# Patient Record
Sex: Female | Born: 1940
Health system: Southern US, Community
[De-identification: ages and names within clinical notes are randomized; demographics above are authoritative.]

## PROBLEM LIST (undated history)

## (undated) DIAGNOSIS — K219 Gastro-esophageal reflux disease without esophagitis: Secondary | ICD-10-CM

## (undated) DIAGNOSIS — G709 Myoneural disorder, unspecified: Secondary | ICD-10-CM

## (undated) DIAGNOSIS — K589 Irritable bowel syndrome without diarrhea: Secondary | ICD-10-CM

## (undated) DIAGNOSIS — E785 Hyperlipidemia, unspecified: Secondary | ICD-10-CM

## (undated) DIAGNOSIS — Z5189 Encounter for other specified aftercare: Secondary | ICD-10-CM

## (undated) DIAGNOSIS — R011 Cardiac murmur, unspecified: Secondary | ICD-10-CM

## (undated) DIAGNOSIS — K602 Anal fissure, unspecified: Secondary | ICD-10-CM

## (undated) DIAGNOSIS — M8580 Other specified disorders of bone density and structure, unspecified site: Secondary | ICD-10-CM

## (undated) DIAGNOSIS — H10029 Other mucopurulent conjunctivitis, unspecified eye: Secondary | ICD-10-CM

## (undated) DIAGNOSIS — K52832 Lymphocytic colitis: Secondary | ICD-10-CM

## (undated) DIAGNOSIS — K760 Fatty (change of) liver, not elsewhere classified: Secondary | ICD-10-CM

## (undated) DIAGNOSIS — M4800 Spinal stenosis, site unspecified: Secondary | ICD-10-CM

## (undated) DIAGNOSIS — M1990 Unspecified osteoarthritis, unspecified site: Secondary | ICD-10-CM

## (undated) DIAGNOSIS — K5730 Diverticulosis of large intestine without perforation or abscess without bleeding: Secondary | ICD-10-CM

## (undated) DIAGNOSIS — I493 Ventricular premature depolarization: Secondary | ICD-10-CM

## (undated) DIAGNOSIS — I7300 Raynaud's syndrome without gangrene: Secondary | ICD-10-CM

## (undated) DIAGNOSIS — L719 Rosacea, unspecified: Secondary | ICD-10-CM

## (undated) HISTORY — DX: Anal fissure, unspecified: K60.2

## (undated) HISTORY — DX: Myoneural disorder, unspecified: G70.9

## (undated) HISTORY — DX: Irritable bowel syndrome, unspecified: K58.9

## (undated) HISTORY — DX: Fatty (change of) liver, not elsewhere classified: K76.0

## (undated) HISTORY — DX: Lymphocytic colitis: K52.832

## (undated) HISTORY — PX: OTHER SURGICAL HISTORY: SHX169

## (undated) HISTORY — DX: Hyperlipidemia, unspecified: E78.5

## (undated) HISTORY — DX: Cardiac murmur, unspecified: R01.1

## (undated) HISTORY — DX: Unspecified osteoarthritis, unspecified site: M19.90

## (undated) HISTORY — DX: Spinal stenosis, site unspecified: M48.00

## (undated) HISTORY — PX: CHOLECYSTECTOMY: SHX55

## (undated) HISTORY — DX: Rosacea, unspecified: L71.9

## (undated) HISTORY — DX: Encounter for other specified aftercare: Z51.89

## (undated) HISTORY — PX: COLONOSCOPY: SHX174

## (undated) HISTORY — DX: Other specified disorders of bone density and structure, unspecified site: M85.80

## (undated) HISTORY — PX: REPLACEMENT TOTAL KNEE: SUR1224

## (undated) HISTORY — DX: Other mucopurulent conjunctivitis, unspecified eye: H10.029

## (undated) HISTORY — DX: Raynaud's syndrome without gangrene: I73.00

## (undated) HISTORY — DX: Gastro-esophageal reflux disease without esophagitis: K21.9

## (undated) HISTORY — DX: Ventricular premature depolarization: I49.3

## (undated) HISTORY — PX: FOOT SURGERY: SHX648

## (undated) HISTORY — PX: KNEE ARTHROSCOPY: SUR90

## (undated) HISTORY — PX: CATARACT EXTRACTION: SUR2

## (undated) HISTORY — DX: Diverticulosis of large intestine without perforation or abscess without bleeding: K57.30

---

## 1962-04-02 HISTORY — PX: OTHER SURGICAL HISTORY: SHX169

## 1971-04-03 HISTORY — PX: HIP SURGERY: SHX245

## 1988-04-02 HISTORY — PX: TOTAL ABDOMINAL HYSTERECTOMY W/ BILATERAL SALPINGOOPHORECTOMY: SHX83

## 1988-04-02 HISTORY — PX: DILATION AND CURETTAGE OF UTERUS: SHX78

## 1996-03-31 ENCOUNTER — Encounter: Payer: Self-pay | Admitting: Internal Medicine

## 1997-10-19 ENCOUNTER — Ambulatory Visit (HOSPITAL_COMMUNITY): Admission: RE | Admit: 1997-10-19 | Discharge: 1997-10-19 | Payer: Self-pay | Admitting: Orthopedic Surgery

## 1998-02-28 ENCOUNTER — Other Ambulatory Visit: Admission: RE | Admit: 1998-02-28 | Discharge: 1998-02-28 | Payer: Self-pay | Admitting: *Deleted

## 1999-02-28 ENCOUNTER — Encounter: Admission: RE | Admit: 1999-02-28 | Discharge: 1999-02-28 | Payer: Self-pay | Admitting: *Deleted

## 1999-02-28 ENCOUNTER — Encounter: Payer: Self-pay | Admitting: *Deleted

## 1999-03-01 ENCOUNTER — Other Ambulatory Visit: Admission: RE | Admit: 1999-03-01 | Discharge: 1999-03-01 | Payer: Self-pay | Admitting: *Deleted

## 1999-08-30 ENCOUNTER — Other Ambulatory Visit: Admission: RE | Admit: 1999-08-30 | Discharge: 1999-08-30 | Payer: Self-pay | Admitting: *Deleted

## 2000-02-01 ENCOUNTER — Other Ambulatory Visit: Admission: RE | Admit: 2000-02-01 | Discharge: 2000-02-01 | Payer: Self-pay | Admitting: *Deleted

## 2000-02-29 ENCOUNTER — Encounter: Payer: Self-pay | Admitting: *Deleted

## 2000-02-29 ENCOUNTER — Encounter: Admission: RE | Admit: 2000-02-29 | Discharge: 2000-02-29 | Payer: Self-pay | Admitting: *Deleted

## 2000-08-07 ENCOUNTER — Other Ambulatory Visit: Admission: RE | Admit: 2000-08-07 | Discharge: 2000-08-07 | Payer: Self-pay | Admitting: *Deleted

## 2001-01-31 ENCOUNTER — Other Ambulatory Visit: Admission: RE | Admit: 2001-01-31 | Discharge: 2001-01-31 | Payer: Self-pay | Admitting: *Deleted

## 2001-03-03 ENCOUNTER — Encounter: Admission: RE | Admit: 2001-03-03 | Discharge: 2001-03-03 | Payer: Self-pay | Admitting: *Deleted

## 2001-03-03 ENCOUNTER — Encounter: Payer: Self-pay | Admitting: *Deleted

## 2002-03-04 ENCOUNTER — Encounter: Payer: Self-pay | Admitting: *Deleted

## 2002-03-04 ENCOUNTER — Encounter: Admission: RE | Admit: 2002-03-04 | Discharge: 2002-03-04 | Payer: Self-pay | Admitting: *Deleted

## 2002-03-12 ENCOUNTER — Other Ambulatory Visit: Admission: RE | Admit: 2002-03-12 | Discharge: 2002-03-12 | Payer: Self-pay | Admitting: *Deleted

## 2002-08-28 ENCOUNTER — Ambulatory Visit: Admission: RE | Admit: 2002-08-28 | Discharge: 2002-08-28 | Payer: Self-pay | Admitting: Orthopedic Surgery

## 2002-10-14 ENCOUNTER — Emergency Department (HOSPITAL_COMMUNITY): Admission: EM | Admit: 2002-10-14 | Discharge: 2002-10-14 | Payer: Self-pay | Admitting: Emergency Medicine

## 2002-10-14 ENCOUNTER — Encounter: Payer: Self-pay | Admitting: Emergency Medicine

## 2002-10-19 ENCOUNTER — Encounter: Payer: Self-pay | Admitting: Internal Medicine

## 2003-03-04 ENCOUNTER — Other Ambulatory Visit: Admission: RE | Admit: 2003-03-04 | Discharge: 2003-03-04 | Payer: Self-pay | Admitting: *Deleted

## 2003-03-10 ENCOUNTER — Encounter: Admission: RE | Admit: 2003-03-10 | Discharge: 2003-03-10 | Payer: Self-pay | Admitting: *Deleted

## 2003-07-19 ENCOUNTER — Encounter: Payer: Self-pay | Admitting: Internal Medicine

## 2003-08-01 HISTORY — PX: TOTAL KNEE ARTHROPLASTY: SHX125

## 2003-08-25 ENCOUNTER — Inpatient Hospital Stay (HOSPITAL_COMMUNITY): Admission: RE | Admit: 2003-08-25 | Discharge: 2003-08-29 | Payer: Self-pay | Admitting: Orthopedic Surgery

## 2003-08-26 ENCOUNTER — Encounter: Payer: Self-pay | Admitting: Internal Medicine

## 2004-02-10 ENCOUNTER — Other Ambulatory Visit: Admission: RE | Admit: 2004-02-10 | Discharge: 2004-02-10 | Payer: Self-pay | Admitting: *Deleted

## 2004-02-22 ENCOUNTER — Ambulatory Visit: Payer: Self-pay | Admitting: Internal Medicine

## 2004-03-02 ENCOUNTER — Ambulatory Visit: Payer: Self-pay | Admitting: Internal Medicine

## 2004-03-17 ENCOUNTER — Encounter: Admission: RE | Admit: 2004-03-17 | Discharge: 2004-03-17 | Payer: Self-pay | Admitting: *Deleted

## 2004-04-02 LAB — HM COLONOSCOPY: HM Colonoscopy: NORMAL

## 2004-06-26 ENCOUNTER — Ambulatory Visit: Payer: Self-pay | Admitting: Internal Medicine

## 2004-08-08 ENCOUNTER — Encounter: Admission: RE | Admit: 2004-08-08 | Discharge: 2004-08-08 | Payer: Self-pay | Admitting: Internal Medicine

## 2004-08-08 ENCOUNTER — Ambulatory Visit: Payer: Self-pay | Admitting: Internal Medicine

## 2004-08-14 ENCOUNTER — Ambulatory Visit: Payer: Self-pay | Admitting: Internal Medicine

## 2004-08-16 ENCOUNTER — Encounter: Payer: Self-pay | Admitting: Internal Medicine

## 2004-08-16 ENCOUNTER — Ambulatory Visit: Payer: Self-pay | Admitting: Cardiology

## 2004-10-26 ENCOUNTER — Ambulatory Visit: Payer: Self-pay | Admitting: Internal Medicine

## 2004-11-09 ENCOUNTER — Ambulatory Visit: Payer: Self-pay | Admitting: Internal Medicine

## 2004-11-10 ENCOUNTER — Encounter: Payer: Self-pay | Admitting: Internal Medicine

## 2004-11-10 ENCOUNTER — Ambulatory Visit: Payer: Self-pay | Admitting: Internal Medicine

## 2004-11-10 LAB — HM COLONOSCOPY

## 2004-11-11 ENCOUNTER — Encounter: Payer: Self-pay | Admitting: Internal Medicine

## 2004-11-13 ENCOUNTER — Ambulatory Visit: Payer: Self-pay | Admitting: Internal Medicine

## 2004-12-12 ENCOUNTER — Ambulatory Visit: Payer: Self-pay | Admitting: Internal Medicine

## 2005-02-07 ENCOUNTER — Ambulatory Visit: Payer: Self-pay | Admitting: Internal Medicine

## 2005-02-12 ENCOUNTER — Other Ambulatory Visit: Admission: RE | Admit: 2005-02-12 | Discharge: 2005-02-12 | Payer: Self-pay | Admitting: Obstetrics and Gynecology

## 2005-02-19 ENCOUNTER — Encounter: Payer: Self-pay | Admitting: Internal Medicine

## 2005-02-19 ENCOUNTER — Ambulatory Visit: Payer: Self-pay

## 2005-03-14 ENCOUNTER — Ambulatory Visit: Payer: Self-pay | Admitting: Internal Medicine

## 2005-03-21 ENCOUNTER — Ambulatory Visit: Payer: Self-pay | Admitting: Internal Medicine

## 2005-04-02 HISTORY — PX: OTHER SURGICAL HISTORY: SHX169

## 2005-05-08 ENCOUNTER — Encounter: Admission: RE | Admit: 2005-05-08 | Discharge: 2005-05-08 | Payer: Self-pay | Admitting: Internal Medicine

## 2005-06-18 ENCOUNTER — Ambulatory Visit: Payer: Self-pay | Admitting: Internal Medicine

## 2005-08-31 HISTORY — PX: TOTAL KNEE ARTHROPLASTY: SHX125

## 2005-09-17 ENCOUNTER — Inpatient Hospital Stay (HOSPITAL_COMMUNITY): Admission: RE | Admit: 2005-09-17 | Discharge: 2005-09-20 | Payer: Self-pay | Admitting: Orthopedic Surgery

## 2005-10-12 ENCOUNTER — Ambulatory Visit: Payer: Self-pay | Admitting: Internal Medicine

## 2006-01-16 ENCOUNTER — Ambulatory Visit: Payer: Self-pay | Admitting: Internal Medicine

## 2006-01-19 ENCOUNTER — Ambulatory Visit: Payer: Self-pay | Admitting: Family Medicine

## 2006-01-22 ENCOUNTER — Ambulatory Visit: Payer: Self-pay | Admitting: Internal Medicine

## 2006-01-22 LAB — CONVERTED CEMR LAB
Basophils Absolute: 0 10*3/uL (ref 0.0–0.1)
Basophils Relative: 0.3 % (ref 0.0–1.0)
Eosinophil percent: 0.8 % (ref 0.0–5.0)
HCT: 44 % (ref 36.0–46.0)
Hemoglobin: 14.2 g/dL (ref 12.0–15.0)
Lymphocytes Relative: 31.4 % (ref 12.0–46.0)
MCHC: 32.2 g/dL (ref 30.0–36.0)
MCV: 84.5 fL (ref 78.0–100.0)
Monocytes Absolute: 0.5 10*3/uL (ref 0.2–0.7)
Monocytes Relative: 8.4 % (ref 3.0–11.0)
Neutro Abs: 3.8 10*3/uL (ref 1.4–7.7)
Neutrophils Relative %: 59.1 % (ref 43.0–77.0)
Platelets: 283 10*3/uL (ref 150–400)
RBC: 5.21 M/uL — ABNORMAL HIGH (ref 3.87–5.11)
RDW: 15.1 % — ABNORMAL HIGH (ref 11.5–14.6)
Sed Rate: 10 mm/hr (ref 0–25)
WBC: 6.2 10*3/uL (ref 4.5–10.5)

## 2006-02-06 ENCOUNTER — Ambulatory Visit: Payer: Self-pay | Admitting: Internal Medicine

## 2006-02-06 LAB — CONVERTED CEMR LAB
ALT: 17 units/L (ref 0–40)
AST: 19 units/L (ref 0–37)
Albumin: 3.7 g/dL (ref 3.5–5.2)
Alkaline Phosphatase: 72 units/L (ref 39–117)
BUN: 8 mg/dL (ref 6–23)
Basophils Absolute: 0 10*3/uL (ref 0.0–0.1)
Basophils Relative: 0.2 % (ref 0.0–1.0)
CO2: 32 meq/L (ref 19–32)
Calcium: 9.1 mg/dL (ref 8.4–10.5)
Chloride: 102 meq/L (ref 96–112)
Chol/HDL Ratio, serum: 4.7
Cholesterol: 168 mg/dL (ref 0–200)
Creatinine, Ser: 0.6 mg/dL (ref 0.4–1.2)
Eosinophil percent: 0.5 % (ref 0.0–5.0)
GFR calc non Af Amer: 107 mL/min
Glomerular Filtration Rate, Af Am: 129 mL/min/{1.73_m2}
Glucose, Bld: 91 mg/dL (ref 70–99)
HCT: 43.1 % (ref 36.0–46.0)
HDL: 35.7 mg/dL — ABNORMAL LOW (ref >39.0)
Hemoglobin: 14.5 g/dL (ref 12.0–15.0)
LDL Cholesterol: 115 mg/dL — ABNORMAL HIGH (ref 0–99)
Lymphocytes Relative: 32.1 % (ref 12.0–46.0)
MCHC: 33.5 g/dL (ref 30.0–36.0)
MCV: 83.6 fL (ref 78.0–100.0)
Monocytes Absolute: 0.5 10*3/uL (ref 0.2–0.7)
Monocytes Relative: 9.3 % (ref 3.0–11.0)
Neutro Abs: 2.9 10*3/uL (ref 1.4–7.7)
Neutrophils Relative %: 57.9 % (ref 43.0–77.0)
Platelets: 257 10*3/uL (ref 150–400)
Potassium: 3.4 meq/L — ABNORMAL LOW (ref 3.5–5.1)
RBC: 5.16 M/uL — ABNORMAL HIGH (ref 3.87–5.11)
RDW: 14.8 % — ABNORMAL HIGH (ref 11.5–14.6)
Sodium: 140 meq/L (ref 135–145)
TSH: 0.77 microintl units/mL (ref 0.35–5.50)
Total Bilirubin: 0.8 mg/dL (ref 0.3–1.2)
Total Protein: 7.1 g/dL (ref 6.0–8.3)
Triglyceride fasting, serum: 88 mg/dL (ref 0–149)
VLDL: 18 mg/dL (ref 0–40)
WBC: 5 10*3/uL (ref 4.5–10.5)

## 2006-02-12 ENCOUNTER — Ambulatory Visit: Payer: Self-pay | Admitting: Internal Medicine

## 2006-02-25 ENCOUNTER — Encounter: Admission: RE | Admit: 2006-02-25 | Discharge: 2006-02-25 | Payer: Self-pay | Admitting: Orthopedic Surgery

## 2006-07-12 ENCOUNTER — Encounter: Payer: Self-pay | Admitting: Internal Medicine

## 2006-07-12 ENCOUNTER — Encounter: Admission: RE | Admit: 2006-07-12 | Discharge: 2006-07-12 | Payer: Self-pay | Admitting: Internal Medicine

## 2006-08-12 ENCOUNTER — Ambulatory Visit: Payer: Self-pay | Admitting: Internal Medicine

## 2006-08-13 LAB — CONVERTED CEMR LAB
ALT: 22 units/L (ref 0–40)
AST: 27 units/L (ref 0–37)
Albumin: 4.4 g/dL (ref 3.5–5.2)
Alkaline Phosphatase: 86 units/L (ref 39–117)
BUN: 14 mg/dL (ref 6–23)
Basophils Absolute: 0.1 10*3/uL (ref 0.0–0.1)
Basophils Relative: 1.2 % — ABNORMAL HIGH (ref 0.0–1.0)
Bilirubin, Direct: 0.1 mg/dL (ref 0.0–0.3)
CO2: 30 meq/L (ref 19–32)
Calcium: 9.4 mg/dL (ref 8.4–10.5)
Chloride: 104 meq/L (ref 96–112)
Cholesterol: 209 mg/dL (ref 0–200)
Creatinine, Ser: 0.6 mg/dL (ref 0.4–1.2)
Direct LDL: 141.2 mg/dL
Eosinophils Absolute: 0.1 10*3/uL (ref 0.0–0.6)
Eosinophils Relative: 0.8 % (ref 0.0–5.0)
GFR calc Af Amer: 129 mL/min
GFR calc non Af Amer: 107 mL/min
Glucose, Bld: 78 mg/dL (ref 70–99)
HCT: 45.9 % (ref 36.0–46.0)
HDL: 43.9 mg/dL (ref >39.0)
Hemoglobin: 15.3 g/dL — ABNORMAL HIGH (ref 12.0–15.0)
Lymphocytes Relative: 22.3 % (ref 12.0–46.0)
MCHC: 33.4 g/dL (ref 30.0–36.0)
MCV: 87.5 fL (ref 78.0–100.0)
Monocytes Absolute: 0.4 10*3/uL (ref 0.2–0.7)
Monocytes Relative: 4.7 % (ref 3.0–11.0)
Neutro Abs: 6.1 10*3/uL (ref 1.4–7.7)
Neutrophils Relative %: 71 % (ref 43.0–77.0)
Platelets: 263 10*3/uL (ref 150–400)
Potassium: 3.8 meq/L (ref 3.5–5.1)
RBC: 5.24 M/uL — ABNORMAL HIGH (ref 3.87–5.11)
RDW: 13.1 % (ref 11.5–14.6)
Sodium: 141 meq/L (ref 135–145)
Total Bilirubin: 0.8 mg/dL (ref 0.3–1.2)
Total CHOL/HDL Ratio: 4.8
Total Protein: 7.1 g/dL (ref 6.0–8.3)
Triglycerides: 183 mg/dL — ABNORMAL HIGH (ref 0–149)
VLDL: 37 mg/dL (ref 0–40)
WBC: 8.6 10*3/uL (ref 4.5–10.5)

## 2006-08-30 ENCOUNTER — Other Ambulatory Visit: Admission: RE | Admit: 2006-08-30 | Discharge: 2006-08-30 | Payer: Self-pay | Admitting: Obstetrics & Gynecology

## 2006-09-09 ENCOUNTER — Ambulatory Visit: Payer: Self-pay | Admitting: Internal Medicine

## 2006-09-12 DIAGNOSIS — M1990 Unspecified osteoarthritis, unspecified site: Secondary | ICD-10-CM | POA: Insufficient documentation

## 2006-09-12 DIAGNOSIS — E785 Hyperlipidemia, unspecified: Secondary | ICD-10-CM | POA: Insufficient documentation

## 2007-01-10 ENCOUNTER — Ambulatory Visit: Payer: Self-pay | Admitting: Internal Medicine

## 2007-01-13 ENCOUNTER — Telehealth (INDEPENDENT_AMBULATORY_CARE_PROVIDER_SITE_OTHER): Payer: Self-pay | Admitting: *Deleted

## 2007-02-21 ENCOUNTER — Telehealth (INDEPENDENT_AMBULATORY_CARE_PROVIDER_SITE_OTHER): Payer: Self-pay | Admitting: *Deleted

## 2007-03-12 ENCOUNTER — Ambulatory Visit: Payer: Self-pay | Admitting: Internal Medicine

## 2007-03-12 DIAGNOSIS — I7300 Raynaud's syndrome without gangrene: Secondary | ICD-10-CM | POA: Insufficient documentation

## 2007-05-13 ENCOUNTER — Telehealth: Payer: Self-pay | Admitting: Internal Medicine

## 2007-05-14 ENCOUNTER — Ambulatory Visit: Payer: Self-pay | Admitting: Internal Medicine

## 2007-06-19 ENCOUNTER — Ambulatory Visit: Payer: Self-pay | Admitting: Internal Medicine

## 2007-07-31 ENCOUNTER — Encounter: Admission: RE | Admit: 2007-07-31 | Discharge: 2007-07-31 | Payer: Self-pay | Admitting: Obstetrics & Gynecology

## 2007-09-01 DIAGNOSIS — M8580 Other specified disorders of bone density and structure, unspecified site: Secondary | ICD-10-CM

## 2007-09-01 HISTORY — DX: Other specified disorders of bone density and structure, unspecified site: M85.80

## 2007-09-01 LAB — CONVERTED CEMR LAB: Pap Smear: NORMAL

## 2007-09-05 ENCOUNTER — Encounter: Payer: Self-pay | Admitting: Internal Medicine

## 2007-09-09 ENCOUNTER — Ambulatory Visit: Payer: Self-pay | Admitting: Internal Medicine

## 2007-09-10 ENCOUNTER — Encounter: Admission: RE | Admit: 2007-09-10 | Discharge: 2007-09-10 | Payer: Self-pay | Admitting: Obstetrics & Gynecology

## 2007-09-10 ENCOUNTER — Encounter: Payer: Self-pay | Admitting: Internal Medicine

## 2007-10-06 ENCOUNTER — Telehealth: Payer: Self-pay | Admitting: Internal Medicine

## 2007-10-18 ENCOUNTER — Telehealth: Payer: Self-pay | Admitting: Gastroenterology

## 2007-10-18 ENCOUNTER — Telehealth: Payer: Self-pay | Admitting: Family Medicine

## 2007-12-10 ENCOUNTER — Ambulatory Visit: Payer: Self-pay | Admitting: Internal Medicine

## 2007-12-10 LAB — CONVERTED CEMR LAB
Bilirubin Urine: NEGATIVE
Glucose, Urine, Semiquant: NEGATIVE
Ketones, urine, test strip: NEGATIVE
Specific Gravity, Urine: 1.02
pH: 6

## 2007-12-11 ENCOUNTER — Encounter: Payer: Self-pay | Admitting: Internal Medicine

## 2007-12-23 ENCOUNTER — Telehealth: Payer: Self-pay | Admitting: Internal Medicine

## 2008-01-21 ENCOUNTER — Ambulatory Visit: Payer: Self-pay | Admitting: Internal Medicine

## 2008-01-23 LAB — CONVERTED CEMR LAB
BUN: 11 mg/dL (ref 6–23)
Basophils Relative: 0.2 % (ref 0.0–3.0)
Calcium: 9.4 mg/dL (ref 8.4–10.5)
Chloride: 99 meq/L (ref 96–112)
Creatinine, Ser: 0.7 mg/dL (ref 0.4–1.2)
Eosinophils Absolute: 0 10*3/uL (ref 0.0–0.7)
Eosinophils Relative: 0.7 % (ref 0.0–5.0)
GFR calc non Af Amer: 89 mL/min
HCT: 46.5 % — ABNORMAL HIGH (ref 36.0–46.0)
Hemoglobin: 16.1 g/dL — ABNORMAL HIGH (ref 12.0–15.0)
MCV: 87.2 fL (ref 78.0–100.0)
Monocytes Absolute: 0.5 10*3/uL (ref 0.1–1.0)
Neutro Abs: 4.3 10*3/uL (ref 1.4–7.7)
Neutrophils Relative %: 62.2 % (ref 43.0–77.0)
RBC: 5.33 M/uL — ABNORMAL HIGH (ref 3.87–5.11)
WBC: 6.9 10*3/uL (ref 4.5–10.5)

## 2008-02-25 ENCOUNTER — Ambulatory Visit: Payer: Self-pay | Admitting: Internal Medicine

## 2008-02-25 ENCOUNTER — Telehealth: Payer: Self-pay | Admitting: Internal Medicine

## 2008-02-25 LAB — CONVERTED CEMR LAB: Rapid Strep: NEGATIVE

## 2008-02-26 ENCOUNTER — Telehealth: Payer: Self-pay | Admitting: Internal Medicine

## 2008-02-27 ENCOUNTER — Ambulatory Visit: Payer: Self-pay | Admitting: Internal Medicine

## 2008-03-04 ENCOUNTER — Ambulatory Visit: Payer: Self-pay | Admitting: Internal Medicine

## 2008-06-21 ENCOUNTER — Telehealth: Payer: Self-pay | Admitting: Internal Medicine

## 2008-08-02 ENCOUNTER — Encounter: Admission: RE | Admit: 2008-08-02 | Discharge: 2008-08-02 | Payer: Self-pay | Admitting: Internal Medicine

## 2008-08-11 ENCOUNTER — Ambulatory Visit: Payer: Self-pay | Admitting: Internal Medicine

## 2008-08-31 ENCOUNTER — Telehealth: Payer: Self-pay | Admitting: Internal Medicine

## 2008-09-01 DIAGNOSIS — K589 Irritable bowel syndrome without diarrhea: Secondary | ICD-10-CM | POA: Insufficient documentation

## 2008-09-01 DIAGNOSIS — K5730 Diverticulosis of large intestine without perforation or abscess without bleeding: Secondary | ICD-10-CM | POA: Insufficient documentation

## 2008-09-01 DIAGNOSIS — I4949 Other premature depolarization: Secondary | ICD-10-CM | POA: Insufficient documentation

## 2008-09-02 ENCOUNTER — Ambulatory Visit: Payer: Self-pay | Admitting: Internal Medicine

## 2008-09-03 LAB — CONVERTED CEMR LAB
CO2: 33 meq/L — ABNORMAL HIGH (ref 19–32)
Creatinine, Ser: 0.6 mg/dL (ref 0.4–1.2)
Eosinophils Relative: 0.3 % (ref 0.0–5.0)
GFR calc non Af Amer: 105.92 mL/min (ref >60)
Glucose, Bld: 121 mg/dL — ABNORMAL HIGH (ref 70–99)
HCT: 45.3 % (ref 36.0–46.0)
Hemoglobin: 15.5 g/dL — ABNORMAL HIGH (ref 12.0–15.0)
Lymphs Abs: 2.1 10*3/uL (ref 0.7–4.0)
Monocytes Relative: 5.6 % (ref 3.0–12.0)
Neutro Abs: 7.4 10*3/uL (ref 1.4–7.7)
RDW: 12.9 % (ref 11.5–14.6)
Total Bilirubin: 1.1 mg/dL (ref 0.3–1.2)
WBC: 10.1 10*3/uL (ref 4.5–10.5)

## 2008-09-06 ENCOUNTER — Ambulatory Visit: Payer: Self-pay | Admitting: Internal Medicine

## 2008-09-06 ENCOUNTER — Ambulatory Visit: Payer: Self-pay | Admitting: Cardiology

## 2008-09-06 ENCOUNTER — Telehealth: Payer: Self-pay | Admitting: Internal Medicine

## 2008-09-06 LAB — CONVERTED CEMR LAB: Potassium: 4.3 meq/L (ref 3.5–5.1)

## 2008-10-18 ENCOUNTER — Telehealth (INDEPENDENT_AMBULATORY_CARE_PROVIDER_SITE_OTHER): Payer: Self-pay | Admitting: *Deleted

## 2008-10-20 ENCOUNTER — Ambulatory Visit: Payer: Self-pay | Admitting: Internal Medicine

## 2008-10-21 LAB — CONVERTED CEMR LAB
Eosinophils Relative: 1 % (ref 0.0–5.0)
Monocytes Absolute: 0.4 10*3/uL (ref 0.1–1.0)
Monocytes Relative: 6.5 % (ref 3.0–12.0)
Neutrophils Relative %: 65.1 % (ref 43.0–77.0)
Platelets: 232 10*3/uL (ref 150.0–400.0)
Vitamin B-12: 234 pg/mL (ref 211–911)
WBC: 5.8 10*3/uL (ref 4.5–10.5)

## 2008-10-22 LAB — CONVERTED CEMR LAB: Vit D, 25-Hydroxy: 18 ng/mL — ABNORMAL LOW (ref 30–89)

## 2008-11-08 ENCOUNTER — Telehealth: Payer: Self-pay | Admitting: Internal Medicine

## 2008-11-17 ENCOUNTER — Ambulatory Visit: Payer: Self-pay | Admitting: Internal Medicine

## 2008-11-22 LAB — CONVERTED CEMR LAB
CO2: 34 meq/L — ABNORMAL HIGH (ref 19–32)
Calcium: 9.2 mg/dL (ref 8.4–10.5)
Chloride: 102 meq/L (ref 96–112)
Sodium: 141 meq/L (ref 135–145)
TSH: 1.63 microintl units/mL (ref 0.35–5.50)

## 2008-11-24 ENCOUNTER — Encounter: Payer: Self-pay | Admitting: Internal Medicine

## 2009-01-03 ENCOUNTER — Ambulatory Visit: Payer: Self-pay | Admitting: Internal Medicine

## 2009-01-12 ENCOUNTER — Encounter: Payer: Self-pay | Admitting: Internal Medicine

## 2009-02-02 ENCOUNTER — Telehealth: Payer: Self-pay | Admitting: Internal Medicine

## 2009-05-16 ENCOUNTER — Telehealth: Payer: Self-pay | Admitting: Internal Medicine

## 2009-06-02 ENCOUNTER — Telehealth: Payer: Self-pay | Admitting: Internal Medicine

## 2009-06-02 ENCOUNTER — Ambulatory Visit: Payer: Self-pay | Admitting: Internal Medicine

## 2009-06-03 LAB — CONVERTED CEMR LAB
Bilirubin Urine: NEGATIVE
Glucose, Urine, Semiquant: NEGATIVE
Protein, U semiquant: NEGATIVE
Specific Gravity, Urine: 1.015
pH: 6.5

## 2009-06-16 ENCOUNTER — Ambulatory Visit: Payer: Self-pay | Admitting: Family Medicine

## 2009-06-16 LAB — CONVERTED CEMR LAB: Rapid Strep: NEGATIVE

## 2009-07-12 ENCOUNTER — Ambulatory Visit: Payer: Self-pay | Admitting: Internal Medicine

## 2009-07-12 DIAGNOSIS — R079 Chest pain, unspecified: Secondary | ICD-10-CM | POA: Insufficient documentation

## 2009-07-14 ENCOUNTER — Encounter: Payer: Self-pay | Admitting: Internal Medicine

## 2009-07-14 LAB — CONVERTED CEMR LAB
Albumin: 4 g/dL (ref 3.5–5.2)
Basophils Absolute: 0 10*3/uL (ref 0.0–0.1)
Basophils Relative: 0.4 % (ref 0.0–3.0)
CO2: 34 meq/L — ABNORMAL HIGH (ref 19–32)
Chloride: 97 meq/L (ref 96–112)
Eosinophils Absolute: 0 10*3/uL (ref 0.0–0.7)
Glucose, Bld: 116 mg/dL — ABNORMAL HIGH (ref 70–99)
HCT: 46.2 % — ABNORMAL HIGH (ref 36.0–46.0)
HDL: 48.1 mg/dL (ref >39.00)
Hemoglobin: 15.8 g/dL — ABNORMAL HIGH (ref 12.0–15.0)
Lymphs Abs: 1.8 10*3/uL (ref 0.7–4.0)
MCHC: 34.1 g/dL (ref 30.0–36.0)
MCV: 89.1 fL (ref 78.0–100.0)
Neutro Abs: 4.7 10*3/uL (ref 1.4–7.7)
Potassium: 3 meq/L — ABNORMAL LOW (ref 3.5–5.1)
RBC: 5.19 M/uL — ABNORMAL HIGH (ref 3.87–5.11)
RDW: 14.6 % (ref 11.5–14.6)
Sodium: 140 meq/L (ref 135–145)
TSH: 2.09 microintl units/mL (ref 0.35–5.50)
Total Protein: 7.5 g/dL (ref 6.0–8.3)

## 2009-07-18 ENCOUNTER — Ambulatory Visit: Payer: Self-pay | Admitting: Internal Medicine

## 2009-07-19 ENCOUNTER — Telehealth: Payer: Self-pay | Admitting: Internal Medicine

## 2009-07-19 LAB — CONVERTED CEMR LAB
BUN: 12 mg/dL (ref 6–23)
Chloride: 104 meq/L (ref 96–112)
Creatinine, Ser: 0.6 mg/dL (ref 0.4–1.2)
Ferritin: 33.5 ng/mL (ref 10.0–291.0)
GFR calc non Af Amer: 105.65 mL/min (ref >60)
Potassium: 3.7 meq/L (ref 3.5–5.1)
Vit D, 25-Hydroxy: 29 ng/mL — ABNORMAL LOW (ref 30–89)

## 2009-08-05 ENCOUNTER — Encounter: Admission: RE | Admit: 2009-08-05 | Discharge: 2009-08-05 | Payer: Self-pay | Admitting: Internal Medicine

## 2009-08-05 LAB — HM MAMMOGRAPHY

## 2009-10-05 ENCOUNTER — Encounter (INDEPENDENT_AMBULATORY_CARE_PROVIDER_SITE_OTHER): Payer: Self-pay | Admitting: *Deleted

## 2009-10-12 ENCOUNTER — Telehealth: Payer: Self-pay | Admitting: Internal Medicine

## 2009-11-23 ENCOUNTER — Telehealth: Payer: Self-pay | Admitting: Internal Medicine

## 2009-12-15 ENCOUNTER — Ambulatory Visit: Payer: Self-pay | Admitting: Internal Medicine

## 2009-12-15 DIAGNOSIS — K645 Perianal venous thrombosis: Secondary | ICD-10-CM | POA: Insufficient documentation

## 2009-12-19 ENCOUNTER — Ambulatory Visit: Payer: Self-pay | Admitting: Family Medicine

## 2009-12-19 DIAGNOSIS — R300 Dysuria: Secondary | ICD-10-CM | POA: Insufficient documentation

## 2009-12-19 LAB — CONVERTED CEMR LAB
Bilirubin Urine: NEGATIVE
Ketones, urine, test strip: NEGATIVE
Nitrite: NEGATIVE
Specific Gravity, Urine: >=1.03
Urobilinogen, UA: 0.2

## 2010-01-05 ENCOUNTER — Telehealth: Payer: Self-pay | Admitting: Internal Medicine

## 2010-01-19 ENCOUNTER — Telehealth: Payer: Self-pay | Admitting: *Deleted

## 2010-01-20 ENCOUNTER — Telehealth: Payer: Self-pay | Admitting: Internal Medicine

## 2010-01-20 ENCOUNTER — Ambulatory Visit: Payer: Self-pay | Admitting: Family Medicine

## 2010-01-20 DIAGNOSIS — N3941 Urge incontinence: Secondary | ICD-10-CM | POA: Insufficient documentation

## 2010-01-20 LAB — CONVERTED CEMR LAB
Bilirubin Urine: NEGATIVE
Protein, U semiquant: NEGATIVE
Specific Gravity, Urine: 1.015
Urobilinogen, UA: 0.2
WBC Urine, dipstick: NEGATIVE
pH: 5

## 2010-01-25 ENCOUNTER — Encounter: Payer: Self-pay | Admitting: Internal Medicine

## 2010-01-31 ENCOUNTER — Ambulatory Visit: Payer: Self-pay | Admitting: Internal Medicine

## 2010-01-31 DIAGNOSIS — J069 Acute upper respiratory infection, unspecified: Secondary | ICD-10-CM | POA: Insufficient documentation

## 2010-02-02 ENCOUNTER — Ambulatory Visit: Payer: Self-pay | Admitting: Family Medicine

## 2010-02-27 ENCOUNTER — Telehealth: Payer: Self-pay | Admitting: Cardiovascular Disease

## 2010-02-27 ENCOUNTER — Ambulatory Visit (HOSPITAL_COMMUNITY): Admission: RE | Admit: 2010-02-27 | Discharge: 2010-02-27 | Payer: Self-pay | Admitting: Cardiovascular Disease

## 2010-02-27 ENCOUNTER — Ambulatory Visit: Payer: Self-pay | Admitting: Cardiovascular Disease

## 2010-02-27 DIAGNOSIS — R072 Precordial pain: Secondary | ICD-10-CM | POA: Insufficient documentation

## 2010-02-27 LAB — CONVERTED CEMR LAB
AST: 22 units/L (ref 0–37)
Albumin: 4.3 g/dL (ref 3.5–5.2)
BUN: 13 mg/dL (ref 6–23)
Basophils Absolute: 0 10*3/uL (ref 0.0–0.1)
Basophils Relative: 0.3 % (ref 0.0–3.0)
Calcium: 9.6 mg/dL (ref 8.4–10.5)
Creatinine, Ser: 0.6 mg/dL (ref 0.4–1.2)
Eosinophils Absolute: 0.1 10*3/uL (ref 0.0–0.7)
GFR calc non Af Amer: 107.52 mL/min (ref >60)
Glucose, Bld: 104 mg/dL — ABNORMAL HIGH (ref 70–99)
HCT: 46.3 % — ABNORMAL HIGH (ref 36.0–46.0)
Hemoglobin: 15.7 g/dL — ABNORMAL HIGH (ref 12.0–15.0)
Lymphocytes Relative: 31.3 % (ref 12.0–46.0)
Lymphs Abs: 2.5 10*3/uL (ref 0.7–4.0)
MCHC: 33.9 g/dL (ref 30.0–36.0)
MCV: 88.8 fL (ref 78.0–100.0)
Monocytes Absolute: 0.5 10*3/uL (ref 0.1–1.0)
Neutro Abs: 4.9 10*3/uL (ref 1.4–7.7)
Potassium: 3.5 meq/L (ref 3.5–5.1)
RBC: 5.21 M/uL — ABNORMAL HIGH (ref 3.87–5.11)
RDW: 14.1 % (ref 11.5–14.6)
Total Bilirubin: 0.9 mg/dL (ref 0.3–1.2)
Total CK: 138 units/L (ref 7–177)

## 2010-03-07 ENCOUNTER — Telehealth (INDEPENDENT_AMBULATORY_CARE_PROVIDER_SITE_OTHER): Payer: Self-pay | Admitting: Radiology

## 2010-03-08 ENCOUNTER — Ambulatory Visit: Payer: Self-pay

## 2010-03-08 ENCOUNTER — Encounter: Payer: Self-pay | Admitting: Cardiology

## 2010-03-08 ENCOUNTER — Encounter: Payer: Self-pay | Admitting: *Deleted

## 2010-03-08 ENCOUNTER — Encounter (HOSPITAL_COMMUNITY)
Admission: RE | Admit: 2010-03-08 | Discharge: 2010-05-02 | Payer: Self-pay | Source: Home / Self Care | Attending: Cardiovascular Disease | Admitting: Cardiovascular Disease

## 2010-03-13 ENCOUNTER — Encounter: Payer: Self-pay | Admitting: Internal Medicine

## 2010-04-23 ENCOUNTER — Encounter: Payer: Self-pay | Admitting: Internal Medicine

## 2010-05-02 NOTE — Progress Notes (Signed)
  Phone Note Call from Patient Call back at Home Phone [phone]   Caller: Patient Call For: Birdie Sons MD Summary of Call: Needs a Diflucan RX sent to [facility] since taking 2 rounds of antibiotics. Initial call taken by: Lynann Beaver CMA,  January 05, 2010 9:15 AM  Follow-up for Phone Call        diflucan 150 mg by mouth once daily x one Follow-up by: Birdie Sons MD,  January 05, 2010 11:13 AM    New/Updated Medications: DIFLUCAN 150 MG TABS (FLUCONAZOLE) one by mouth now Prescriptions: DIFLUCAN 150 MG TABS (FLUCONAZOLE) one by mouth now  #1 x 9   Entered by:   Lynann Beaver CMA   Authorized by:   Birdie Sons MD   Signed by:   Lynann Beaver CMA on 01/05/2010   Method used:   Electronically to        [facility] (retail)       [address]       [place], [place]  [id number]       Ph: [phone] or [phone]       Fax: [phone]   RxID:   [id number]

## 2010-05-02 NOTE — Progress Notes (Signed)
Summary: Chest tightness   Phone Note Call from Patient   Caller: Pt [phone] Summary of Call: I spoke with the pt and she c/o chest tightness and her left arm "bothering" her since Friday.  The pt's symptoms started Friday night and she thought about going to the ER but decided to take an ASA and see if her symptoms resolved.  The pt continues to have symptoms and would like to know what Dr Excell Seltzer recommends.     Julieta Gutting, RN, BSN,  February 27, 2010 10:47 AM  I spoke with Dr Excell Seltzer and he will see the pt today for evaluation.      [Prescriptions] Initial call taken by: Julieta Gutting, RN, BSN,  February 27, 2010 11:54 AM

## 2010-05-02 NOTE — Assessment & Plan Note (Signed)
Summary: np6/chest tightness, left arm pain/lwb  Medications Added ASPIRIN 81 MG TBEC (ASPIRIN) Take one tablet by mouth daily        Visit Type:  Initial Consult Primary Provider:  Birdie Sons, MD  CC:  Chest tightness- left arm pain for about 4 days.  History of Present Illness: 70 year-old presents for initial evaluation of chest pain. She complains of 4 days of chest pain that she describes as 'uncomfortable.' Pain is in the left chest, posterior neck, and left arm. She describes the pain as constant. She considered going to the ER 3 nights ago but opted not to do that.  Overall the pain has eased since it's onset. She has had mild shortness of breath associated with the chest pain. Pain has not been different with exertion. No associated nausea, diaphoresis, palpitations, or lightheadedness. She has no history of cardiac disease.  Current Medications (verified): 1)  Spironolactone-Hctz 25-25 Mg  Tabs (Spironolactone-Hctz) .... One By Mouth Daily 2)  Premarin 0.3 Mg Tabs (Estrogens Conjugated) .... Take 1 Tablet By Mouth Once A Day 3)  Meclizine Hcl 25 Mg Tabs (Meclizine Hcl) .... One Every 12 Hours As Needed 4)  Vitamin D (Ergocalciferol) 50000 Unit Caps (Ergocalciferol) .... One Tab By Mouth Q Week For 12 Weeks-Recheck Labs in 16 Weeks. 5)  Lomotil 2.5-0.025 Mg Tabs (Diphenoxylate-Atropine) .... Take 1 Tablet By Mouth Two Times A Day As Needed--Not To Exceed 3 Day Use 6)  K-Lor 20 Meq Pack (Potassium Chloride) .... One By Mouth Daily As Needed 7)  Zantac 150 Mg Tabs (Ranitidine Hcl) .Marland Kitchen.. 1 By Mouth Once Daily As Needed  Allergies: 1)  Codeine Phosphate (Codeine Phosphate) 2)  Morphine Sulfate (Morphine Sulfate)  Past History:  Past medical, surgical, family and social histories (including risk factors) reviewed, and no changes noted (except as noted below).  Past Medical History: Reviewed history from 09/01/2008 and no changes required. 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 Hysterectomy,bx--1990 Total knee replacement X2--both knees  lens implants Cholecystectomy Dental Implants Cataract extraction-bilateral Foot Surgery  Family History: Reviewed history from 09/01/2008 and no changes required. mother: alive No FH of Colon Cancer: Family History of Ovarian Cancer: Grandmother Family History of Diabetes: Grandfather Family History of Heart Disease: Father  Social History: Reviewed history from 09/01/2008 and no changes required. Married Occupation: Retired Veterinary surgeon Alcohol Use - yes-rare Illicit Drug Use - no Tobacco - none  Review of Systems       Negative except as per HPI   Vital Signs:  Patient profile:   70 year old female Height:      65.5 inches Weight:      173.50 pounds BMI:     28.54 Pulse rate:   66 / minute Pulse rhythm:   regular Resp:     18 per minute BP sitting:   128 / 80  (left arm) Cuff size:   large  Vitals Entered By: Vikki Ports (February 27, 2010 12:46 PM)  Physical Exam  General:  Pt is well-developed, very pleasant woman, alert and oriented, no acute distress HEENT: normal Neck: no thyromegaly           JVP normal, carotid upstrokes normal without bruits Lungs: CTA  Chest: equal expansion  CV: Apical impulse nondisplaced, RRR without murmur or gallop. Loud S2. Abd: soft, NT, positive BS, no HSM, no bruit Back: no CVA tenderness Ext: no clubbing, cyanosis, or edema        femoral pulses 2+ without bruits        pedal pulses 2+ and equal Skin: warm,  dry, no rash Neuro: CNII-XII intact,strength 5/5 = b/l    EKG  Procedure date:  02/27/2010  Findings:      NSR 66 bpm, Lefward axis, nonspecific T wave abnormality, prolonged QT (QTc 471 ms).  Impression & Recommendations:  Problem # 1:  CHEST PAIN, PRECORDIAL (ICD-786.51) Pt with chest pain, both typical and atypical features. The prolonged nature of pain, now constant greater than 4 days, argues against an acute coronary syndrome. Her EKG is nonspecific. I don't see evidence of CHF or pulmonary disease on exam. Recommend the following:  Check a CXR Stat labs to include Troponin and CKMB Exercise Myoview stress test to rule out inducible ischemia Start ASA 81 mg daily  Her updated medication list for this problem includes:    Aspirin 81 Mg Tbec (Aspirin) .Marland Kitchen... Take one tablet by mouth daily  Orders: TLB-CBC Platelet - w/Differential (85025-CBCD) TLB-BNP (B-Natriuretic Peptide) (83880-BNPR) TLB-BMP (Basic Metabolic Panel-BMET) (80048-METABOL) TLB-Hepatic/Liver Function Pnl (80076-HEPATIC) TLB-CK Total Only(Creatine Kinase/CPK) (82550-CK) T-Troponin I (98119-14782) T-2 View CXR (71020TC) Nuclear Stress Test (Nuc Stress Test)  Problem # 2:  HYPERLIPIDEMIA (ICD-272.4) Await results of cardiac testing. If no evidence of CAD continue with lifestyle modification.  CHOL: 193 (07/12/2009)   LDL: 126 (07/12/2009)   HDL: 48.10 (07/12/2009)   TG: 93.0 (07/12/2009)  Other Orders: EKG w/ Interpretation (93000)  Patient Instructions: 1)  Your physician recommends that you schedule a follow-up appointment in: as needed with Dr. Excell Seltzer 2)  Your physician recommends that you have lab work today: bmp,bnp, liver troponin,ck,cbc 3)  Your physician recommends that you continue on your current medications as directed. Please refer to the Current Medication list given to you today. 4)  A chest x-ray takes a picture of the organs and structures inside the chest, including the heart, lungs, and  blood vessels. This test can show several things, including, whether the heart is enlarged; whether fluid is building up in the lungs; and whether pacemaker / defibrillator leads are still in place. TODAY AT [facility] 5)  Your physician has requested that you have an exercise stress myoview.  For further information please visit [email or web address]  Please follow instruction sheet, as given.

## 2010-05-02 NOTE — Assessment & Plan Note (Signed)
Summary: follow/pt fasting/cjr   Vital Signs:  Patient profile:   70 year old female Weight:      175 pounds BMI:     28.78 Pulse rate:   64 / minute Pulse rhythm:   regular Resp:     12 per minute BP sitting:   118 / 76  (left arm) Cuff size:   regular  Vitals Entered By: Gladis Riffle, RN (July 12, 2009 7:53 AM)  Nutrition Counseling: Patient's BMI is greater than 25 and therefore counseled on weight management options. CC: FU, fasting Is Patient Diabetic? No   CC:  FU and fasting.  History of Present Illness: wellness medicare visit  also complains of :  Insomnia: early awakenings---"mind starts running"  Lipids: needs f/u  She complains ofchest tightness: does not happen with exertion. She thinks it is stress. Chest tightness typically happens at rest, will last hour, resolves spontaneously. She admits to nocurnal "heartburn". No associated sxs except once in a while she will note associated left arm pain.   All other systems reviewed and were negative   Preventive Screening-Counseling & Management  Alcohol-Tobacco     Smoking Status: quit  Current Problems (verified): 1)  Hx of Premature Ventricular Contractions  (ICD-427.69) 2)  Diverticulosis, Colon  (ICD-562.10) 3)  Hx of Chronic Lymphocytic Colitis  () 4)  Irritable Bowel Syndrome  (ICD-564.1) 5)  Raynaud's Syndrome  (ICD-443.0) 6)  Osteoarthritis  (ICD-715.90) 7)  Hyperlipidemia  (ICD-272.4)  Current Medications (verified): 1)  Spironolactone-Hctz 25-25 Mg  Tabs (Spironolactone-Hctz) .... One By Mouth Daily 2)  Premarin 0.3 Mg Tabs (Estrogens Conjugated) .... Take 1 Tablet By Mouth Once A Day 3)  Meclizine Hcl 25 Mg Tabs (Meclizine Hcl) .... One Every 12 Hours As Needed 4)  Omeprazole 40 Mg Cpdr (Omeprazole) .... Take 1 Tablet By Mouth Once Daily 30 Minutes Before Breakfast. Pharmacy-Please D/c Aciphex Rx..insurance Will Not Cover. 5)  Vitamin D (Ergocalciferol) 50000 Unit Caps (Ergocalciferol) .... One  Tab By Mouth Q Week For 12 Weeks-Recheck Labs in 16 Weeks. 6)  Fluticasone Propionate 50 Mcg/act  Susp (Fluticasone Propionate) .... 2 Sprays Each Nostril Once Daily 7)  Fluconazole 150 Mg Tabs (Fluconazole) .... Take 1 Tablet By Mouth Once A Day X 3 Days 8)  Lomotil 2.5-0.025 Mg Tabs (Diphenoxylate-Atropine) .... Take 1 Tablet By Mouth Two Times A Day As Needed--Not To Exceed 3 Day Use  Allergies: 1)  ! 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) DYSPHAGIA UNSPECIFIED (ICD-787.20) RAYNAUD'S SYNDROME (ICD-443.0) OSTEOARTHRITIS (ICD-715.90) HYPERLIPIDEMIA (ICD-272.4)    Past Surgical History: Last updated: 09/02/2008 Hysterectomy,bx--1990 Total knee replacement X2--both knees  lens implants Cholecystectomy Dental Implants Cataract extraction-bilateral Foot Surgery  Family History: Last updated: 09/01/2008 mother: alive No FH of Colon Cancer: Family History of Ovarian Cancer: Grandmother Family History of Diabetes: Grandfather Family History of Heart Disease: Father  Social History: Last updated: 09/01/2008 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   Eyes:  pupils equal and pupils round.   Ears:  R ear normal  and L ear normal.   Nose:  no external deformity and no external erythema.   Neck:  No deformities, masses, or tenderness noted. Chest Wall:  Symmetrical; no deformities or tenderness. Lungs:  normal respiratory effort and no intercostal retractions.   Heart:  normal rate and regular rhythm.   Abdomen:  soft and non-tender.   Msk:  No deformity or scoliosis noted of thoracic or lumbar spine.   Pulses:  R radial normal and L radial normal.   Neurologic:  cranial nerves II-XII intact and gait normal.     Impression & Recommendations:  Problem # 1:  PREVENTIVE HEALTH CARE (ICD-V70.0)  health maint UTD  Orders: First annual wellness visit with prevention plan  (U5427) Venipuncture (06237) TLB-Lipid Panel (80061-LIPID) TLB-BMP (Basic Metabolic Panel-BMET) (80048-METABOL) TLB-CBC Platelet - w/Differential (85025-CBCD) TLB-Hepatic/Liver Function Pnl (80076-HEPATIC) TLB-TSH (Thyroid Stimulating Hormone) (84443-TSH) UA Dipstick w/o Micro (automated)  (81003) Here for Medicare AWV:  1.   Risk factors based on Past M, S, F history: see history section 2.   Physical Activities: -- she is able to all activities 3.   Depression/mood: --none noted 4.   Hearing:---no concerns  5.   ADL's: - able to do all 6.   Fall Risk: --none 7.   Home Safety: ---no concerns 8.   Height, weight, &visual acuity:see PE 9.   Counseling: none necessary except for weight 10.   Labs ordered based on risk factors: see orders 11.           Referral Coordination--none necessary except problem oriented 12.           Care Plan--weight loss/exercise  Problem # 2:  HYPERLIPIDEMIA (ICD-272.4) never treated check labs today  Problem # 3:  CHEST PAIN (ICD-786.50)  some concern check EKG check stress test continue omeprazole.   Orders: EKG w/ Interpretation (93000)  Complete Medication List: 1)  Spironolactone-hctz 25-25 Mg Tabs (Spironolactone-hctz) .... One by mouth daily 2)  Premarin 0.3 Mg Tabs  (Estrogens conjugated) .... Take 1 tablet by mouth once a day 3)  Meclizine Hcl 25 Mg Tabs (Meclizine hcl) .... One every 12 hours as needed 4)  Omeprazole 40 Mg Cpdr (Omeprazole) .... Take 1 tablet by mouth once daily 30 minutes before breakfast. pharmacy-please d/c aciphex rx..insurance will not cover. 5)  Vitamin D (ergocalciferol) 50000 Unit Caps (Ergocalciferol) .... One tab by mouth q week for 12 weeks-recheck labs in 16 weeks. 6)  Fluticasone Propionate 50 Mcg/act Susp (Fluticasone propionate) .... 2 sprays each nostril once daily 7)  Fluconazole 150 Mg Tabs (Fluconazole) .... Take 1 tablet by mouth once a day x 3 days 8)  Lomotil 2.5-0.025 Mg Tabs (Diphenoxylate-atropine) .... Take 1 tablet by mouth two times a day as needed--not to exceed 3 day use    Prevention & Chronic Care Immunizations   Influenza vaccine: fluvirin  (01/11/2009)   Influenza vaccine due: 01/10/2008    Tetanus booster: 07/25/1998: Td   Tetanus booster due: 07/24/2008    Pneumococcal vaccine: Pneumovax (Medicare)  (01/21/2008)   Pneumococcal vaccine due: 01/20/2013    H. zoster vaccine: 06/19/2007: Zostavax  Colorectal Screening   Hemoccult: Not documented   Hemoccult action/deferral: Not indicated  (07/12/2009)    Colonoscopy: Normal  (04/02/2004)   Colonoscopy due: 04/02/2014  Other Screening   Pap smear: normal  (09/01/2007)   Pap smear due:  09/01/2010    Mammogram: ASSESSMENT: Negative - BI-RADS 1^MM DIGITAL SCREENING  (08/02/2008)   Mammogram due: 08/31/2008    DXA bone density scan: Not documented   Smoking status: quit  (07/12/2009)  Lipids   Total Cholesterol: 214  (01/21/2008)   LDL: DEL  (01/21/2008)   LDL Direct: 131.9  (01/21/2008)   HDL: 46.7  (01/21/2008)   Triglycerides: 114  (01/21/2008)    SGOT (AST): 23  (09/02/2008)   SGPT (ALT): 21  (09/02/2008)   Alkaline phosphatase: 67  (09/02/2008)   Total bilirubin: 1.1  (09/02/2008)    Lipid flowsheet reviewed?: Yes    Progress toward LDL goal: At goal  Self-Management Support :    Patient will work on the following items until the next clinic visit to reach self-care goals:     Medications and monitoring: take my medicines every day  (07/12/2009)     Eating: drink diet soda or water instead of juice or soda  (07/12/2009)     Activity: take a 30 minute walk every day  (07/12/2009)    Lipid self-management support: Not documented    Nursing Instructions: Give tetanus booster today   Appended Document: Orders Update     Clinical Lists Changes  Observations: Added new observation of COMMENTS: Wynona Canes, CMA  July 12, 2009 9:16 AM  (07/12/2009 9:15) Added new observation of PH URINE: 6.5  (07/12/2009 9:15) Added new observation of SPEC GR URIN: 1.020  (07/12/2009 9:15) Added new observation of APPEARANCE U: Clear  (07/12/2009 9:15) Added new observation of UA COLOR: yellow  (07/12/2009 9:15) Added new observation of WBC DIPSTK U: negative  (07/12/2009 9:15) Added new observation of NITRITE URN: negative  (07/12/2009 9:15) Added new observation of UROBILINOGEN: 0.2  (07/12/2009 9:15) Added new observation of PROTEIN, URN: negative  (07/12/2009 9:15) Added new observation of BLOOD UR DIP: negative  (07/12/2009 9:15) Added new observation of KETONES URN: negative  (07/12/2009 9:15) Added new observation of BILIRUBIN UR: negative  (07/12/2009 9:15) Added new observation of GLUCOSE, URN: negative  (07/12/2009 9:15)      Laboratory Results   Urine Tests  Date/Time Recieved: July 12, 2009 9:16 AM  Date/Time Reported: July 12, 2009 9:16 AM   Routine Urinalysis   Color: yellow Appearance: Clear Glucose: negative   (Normal Range: Negative) Bilirubin: negative   (Normal Range: Negative) Ketone: negative   (Normal Range: Negative) Spec. Gravity: 1.020   (Normal Range: 1.003-1.035) Blood: negative   (Normal Range: Negative) pH: 6.5   (Normal Range: 5.0-8.0) Protein: negative    (Normal Range: Negative) Urobilinogen: 0.2   (Normal Range: 0-1) Nitrite: negative   (Normal Range: Negative) Leukocyte Esterace: negative   (Normal Range: Negative)    Comments: Wynona Canes, CMA  July 12, 2009 9:16 AM     Appended Document: follow/pt fasting/cjr  cognitive impairment assessment: no concerns identified

## 2010-05-02 NOTE — Progress Notes (Signed)
Summary: see her mother in law tomorrow  Phone Note Call from Patient Call back at [phone]   Caller: vm Summary of Call: Mother in law here from [place].  Has a very swollen leg/foot left swollen, tight, red, both feet tight, rash on one.  Wants someone to see he tomorrow.  Dr. Cato Mulligan has seen her before 2-3- yrs ago.  Anyone would be OK.  Dr. Cato Mulligan 8:30 tomorrow. Rudy Jew, RN  October 12, 2009 5:33 PM   Initial call taken by: Rudy Jew, RN,  October 12, 2009 5:11 PM

## 2010-05-02 NOTE — Progress Notes (Signed)
Summary: Talk to nurse   Phone Note Call from Patient Call back at Home Phone [phone]   Caller: Patient Call For: Dr. Juanda Chance Reason for Call: Talk to Nurse Summary of Call: Pt's GYN phys noted an  thrombosis hemorrhoid (internal) on a recent exam.  Pt was advised to let Dr Juanda Chance know.  Please call pt and advise what she should do.   Initial call taken by: Misty Stanley,  November 23, 2009 4:08 PM  Follow-up for Phone Call        Corrie Mckusick found patient has a thrombosed internal hemorrhoid on her routine GYN exam.  Patient  dnies any pain or discomfort.  She is wondering if anything needs done about this, because she is not having any pain or discomfort from it.  Patient aware when Dr Juanda Chance has reviewed we will give her a call. Follow-up by: Darcey Nora RN, CGRN,  November 24, 2009 8:32 AM  Additional Follow-up for Phone Call Additional follow up Details #1::        I have reviewed the chart. No mention of a hemorrhoid on last colon 2006 ( she is due for recall 2013). Nothing needs to be done to the hemorrhoid if it is asymptomatic. Additional Follow-up by: Hart Carwin MD,  November 24, 2009 8:54 AM    Additional Follow-up for Phone Call Additional follow up Details #2::    patient aware of Dr Regino Schultze recommendations.  She says she would feel better if Dr Juanda Chance verified it is actually at thrombosed hemorrhoid and not a growth.  She will come in and see Dr Juanda Chance 12/15/09 9:00 Follow-up by: Darcey Nora RN, CGRN,  November 24, 2009 9:16 AM

## 2010-05-02 NOTE — Assessment & Plan Note (Signed)
Summary: congestion//ccm   Vital Signs:  Patient profile:   70 year old female Weight:      174 pounds Temp:     99.8 degrees F oral BP sitting:   140 / 90  (left arm) Cuff size:   regular  Vitals Entered By: Sid Falcon LPN (February 02, 2010 10:04 AM)  History of Present Illness: Feels much worse than 2 days.  ? viral syndrome. Productive cough, nasal congestion, sore throat, and nasal congestion. no vomiting or diarrhea.  Cough now more productive of green sputum. Ibuprofen with mild relief of aches.  Allergies: 1)  ! 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  Otoscopic examination reveals clear canals, tympanic membranes are intact bilaterally without bulging, retraction, inflammation or discharge. Hearing is grossly normal bilaterally. Mouth:  Oral mucosa and oropharynx without lesions or exudates.  Teeth in good repair. Neck:  No deformities, masses, or tenderness noted. Lungs:  Normal respiratory effort, chest expands symmetrically. Lungs are clear to auscultation, no crackles or wheezes. Heart:  Normal rate and regular rhythm. S1 and S2 normal without  gallop, murmur, click, rub or other extra sounds.   Impression & Recommendations:  Problem # 1:  URI (ICD-465.9) Explained this still may be all viral but pt with signif worsening of symptoms and will add Z-max.  Complete Medication List: 1)  Spironolactone-hctz 25-25 Mg Tabs (Spironolactone-hctz) .... One by mouth daily 2)  Premarin 0.3 Mg Tabs (Estrogens conjugated) .... Take 1 tablet by mouth once a day 3)  Meclizine Hcl 25 Mg Tabs (Meclizine hcl) .... One every 12 hours as needed 4)  Vitamin D (ergocalciferol) 50000 Unit Caps (Ergocalciferol) .... One tab by mouth q week for 12 weeks-recheck labs in 16 weeks. 5)  Lomotil 2.5-0.025 Mg Tabs (Diphenoxylate-atropine) .... Take 1 tablet by mouth two times a day as needed--not to exceed 3 day use 6)  K-lor 20 Meq Pack (Potassium chloride) .... One by mouth daily as needed 7)  Zantac 150 Mg Tabs (Ranitidine hcl) .Marland Kitchen.. 1 by mouth once daily as needed 8)  Azithromycin 250 Mg Tabs (Azithromycin) .... 2 by mouth today then one by mouth once daily for 4 days  Patient Instructions: 1)  Acute Bronchitis symptoms for less then 10 days are not  helped by antibiotics. Take over the counter cough medications. Call if no improvement in 5-7 days, sooner if increasing cough, fever, or new symptoms ( shortness of breath, chest pain) .  Prescriptions: AZITHROMYCIN 250 MG TABS (AZITHROMYCIN) 2 by mouth today then one by mouth once daily for 4 days  #6 x 0   Entered and Authorized by:   Evelena Peat MD   Signed by:   Evelena Peat MD on 02/02/2010   Method used:   Electronically to        [facility] (retail)  [address]       [place], [place]  [id number]       Ph: [phone] or [phone]       Fax: [phone]   RxID:   [id number]    Orders Added: 1)  Est. Patient Level III [02542]

## 2010-05-02 NOTE — Progress Notes (Signed)
Summary: Nuc Pre-Procedure  Phone Note Outgoing Call Call back at [facility] Phone [phone]   Call placed by: Leonia Corona, RT-N,  March 07, 2010 4:11 PM Call placed to: Patient Summary of Call: Left message with information on Myoview Information Sheet (see scanned document for details).      Nuclear Med Background Indications for Stress Test: Evaluation for Ischemia   History: Abnormal EKG, Echo, Myocardial Perfusion Study  History Comments: 2005- Echo- EF= 55-65% 2006- MPS- Normal. EF= 53%  Symptoms: Chest Tightness, SOB  Symptoms Comments: Chest tightness radiating into left arm.   Nuclear Pre-Procedure Cardiac Risk Factors: Family History - CAD, Lipids Height (in): 65.5

## 2010-05-02 NOTE — Assessment & Plan Note (Signed)
Summary: bladder inf/cjr   Vital Signs:  Patient profile:   70 year old female Weight:      173 pounds Temp:     98.1 degrees F oral BP sitting:   130 / 84  (left arm) Cuff size:   regular  Vitals Entered By: Sid Falcon LPN (January 20, 2010 2:47 PM)  History of Present Illness: Patient sent for the following items.  4-5 day history of some urine urgency. No burning with urination. Nocturia 4-5 times per night. She was concerned about possible UTI. No fever or chills. Does have some poorly localized vague low back pains. Patient also relates 3-4 day history of suprapubic pain burning sensation which is worse after eating. She has some chronic intermittent loose stools following gallbladder surgery many years ago. No recent bloody stools.  prior colonoscopy 2006.  Minimal caffeine use.         This is a 70 year old woman who presents with Abdominal Pain.  The patient denies nausea, vomiting, constipation, melena, hematochezia, anorexia, and hematemesis.  The location of the pain is suprapubic.  The pain is described as intermittent and burning in quality.  The patient denies the following symptoms: fever, weight loss, chest pain, dark urine, and vaginal bleeding.  The pain is worse with food.    Allergies: 1)  ! 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) DYSPHAGIA UNSPECIFIED (ICD-787.20) RAYNAUD'S SYNDROME (ICD-443.0) OSTEOARTHRITIS (ICD-715.90) HYPERLIPIDEMIA (ICD-272.4)    Past Surgical History: Last updated:  09/02/2008 Hysterectomy,bx--1990 Total knee replacement X2--both knees  lens implants Cholecystectomy Dental Implants Cataract extraction-bilateral Foot Surgery  Family History: Last updated: 09/01/2008 mother: alive No FH of Colon Cancer: Family History of Ovarian Cancer: Grandmother Family History of Diabetes: Grandfather Family History of Heart Disease: Father  Social History: Last updated: 09/01/2008 Married Occupation: Retired Veterinary surgeon Alcohol Use - yes-rare Illicit Drug Use - no  Risk Factors: Smoking Status: quit (07/12/2009) PMH-FH-SH reviewed for relevance  Review of Systems      See HPI  Physical Exam  General:  Well-developed,well-nourished,in no acute distress; alert,appropriate and cooperative throughout examination Mouth:  Oral mucosa and oropharynx without lesions or exudates.  Teeth in good repair. Lungs:  Normal respiratory effort, chest expands symmetrically. Lungs are clear to auscultation, no crackles or wheezes. Heart:  Normal rate and regular rhythm. S1 and S2 normal without gallop, murmur, click, rub or other extra sounds. Abdomen:  Bowel sounds positive,abdomen soft and non-tender without masses, organomegaly or hernias noted. Msk:  No deformity or scoliosis noted of thoracic or lumbar spine.   Extremities:  No clubbing, cyanosis, edema, or deformity noted with normal full range of motion of all joints.   Psych:  normally interactive, good eye contact, not anxious appearing, and not depressed appearing.     Impression & Recommendations:  Problem # 1:  URGE INCONTINENCE (ICD-788.31) Assessment New  urine dipstick unremarkable. Discussed possible trial of VESIcare 5 mg q.h.s. She has no contraindications. Samples are given and possible side effects reviewed.  limit caffeine use.  Orders: UA Dipstick w/o Micro (manual) (16109)  Problem # 2:  ABDOMINAL PAIN, LOWER (ICD-789.09) Assessment: New ?etiology.  Nonfocal exam.  Observe for now  and  further w/u for any fever or persistent/worsening pain.  Complete Medication List: 1)  Spironolactone-hctz 25-25 Mg Tabs (Spironolactone-hctz) .... One by mouth daily 2)  Premarin 0.3 Mg Tabs (Estrogens conjugated) .... Take 1 tablet by mouth once a day 3)  Meclizine Hcl 25 Mg Tabs (Meclizine hcl) .... One every 12 hours as needed 4)  Vitamin D (ergocalciferol) 50000 Unit Caps (Ergocalciferol) .... One tab by mouth q week for 12 weeks-recheck labs in 16 weeks. 5)  Lomotil 2.5-0.025 Mg Tabs (Diphenoxylate-atropine) .... Take 1 tablet by mouth two times a day as needed--not to exceed 3 day use 6)  K-lor 20 Meq Pack (Potassium chloride) .... One by mouth daily as needed 7)  Zantac 150 Mg Tabs (Ranitidine hcl) .Marland Kitchen.. 1 by mouth once daily as needed 8)  Ciprofloxacin Hcl 250 Mg Tabs (Ciprofloxacin hcl) .... One by mouth two times a day for 7 days 9)  Diflucan 150 Mg Tabs (Fluconazole) .... One by mouth now  Patient Instructions: 1)  Start Vesicare 5 mg one at night. 2)  Follow up promptly for any fever, change in stools, or any progressive pain.   Orders Added: 1)  UA Dipstick w/o Micro (manual) [81002] 2)  Est. Patient Level IV [14782]    Laboratory Results   Urine Tests    Routine Urinalysis   Color: yellow Appearance: Clear Glucose: negative   (Normal Range: Negative) Bilirubin: negative   (Normal Range: Negative) Ketone: negative   (Normal Range: Negative) Spec. Gravity: 1.015   (Normal Range: 1.003-1.035) Blood: negative   (Normal Range: Negative) pH: 5.0   (Normal Range: 5.0-8.0) Protein: negative   (Normal Range: Negative) Urobilinogen: 0.2   (Normal Range: 0-1) Nitrite: negative   (Normal Range: Negative) Leukocyte Esterace: negative   (Normal Range: Negative)    Comments: Sid Falcon LPN  January 20, 2010 2:57 PM

## 2010-05-02 NOTE — Progress Notes (Signed)
Summary: lomotil  Phone Note From Pharmacy Call back at [phone] ph; [phone] fax   Caller: brown gardiner Call For: swords  Summary of Call: Pt requests Rx lomotil--has "what is going around" Initial call taken by: Gladis Riffle, RN,  May 16, 2009 3:35 PM  Follow-up for Phone Call        per dr Lovell Sheehan ok to give her #10; Take 1 tablet by mouth two times a day as needed , not to exceed 3 day use Follow-up by: Gladis Riffle, RN,  May 16, 2009 3:36 PM  Additional Follow-up for Phone Call Additional follow up Details #1::        see Rx Additional Follow-up by: Gladis Riffle, RN,  May 16, 2009 3:39 PM    New/Updated Medications: LOMOTIL 2.5-0.025 MG TABS (DIPHENOXYLATE-ATROPINE) Take 1 tablet by mouth two times a day as needed--NOT TO EXCEED 3 DAY USE Prescriptions: LOMOTIL 2.5-0.025 MG TABS (DIPHENOXYLATE-ATROPINE) Take 1 tablet by mouth two times a day as needed--NOT TO EXCEED 3 DAY USE  #10 x 0   Entered by:   Gladis Riffle, RN   Authorized by:   Stacie Glaze MD   Signed by:   Gladis Riffle, RN on 05/16/2009   Method used:   Telephoned to ...       [facility] (retail)       [address]       [place], [place]  [address]       Ph: [phone] or [phone]       Fax: [phone]   RxID:   [id number]

## 2010-05-02 NOTE — Progress Notes (Signed)
Summary: NEED FOR REFERRAL?  Phone Note Call from Patient   Caller: Patient   [phone] Summary of Call: Pt called to schedule appt w/ Dr Cato Mulligan...Marland KitchenMarland Kitchen Pt adv she is still having bladder issues (c/o frequent urination, burning / discomfort w/ urination, back pain).... Pt wants to know if she should come in for an OV with Dr Cato Mulligan  or  if she should obtain a referral to Urology.... Pt was offered OV with another physician (no appt avail w/ Dr Cato Mulligan for today) but pt delclined stating she needs to know if she should just go see a Urologist?  Pt can be reached at [phone].  Initial call taken by: Debbra Riding,  January 19, 2010 8:23 AM  Follow-up for Phone Call        urology please refer Follow-up by: Birdie Sons MD,  January 19, 2010 3:05 PM  Additional Follow-up for Phone Call Additional follow up Details #1::        Please put order in for referral.... Pt adv she will check with Dr Larey Dresser (who is a personal friend of pt) to see who he recommends - she will c/b to advise who she wants appt to be set up with.  Additional Follow-up by: Debbra Riding,  January 19, 2010 3:26 PM

## 2010-05-02 NOTE — Progress Notes (Signed)
Summary: medication question  Phone Note Call from Patient Call back at Home Phone [phone]   Caller: Patient Call For: Birdie Sons MD Summary of Call: Sheliah Plane told her she cannot take spironolactone with potassium as it will cause renal failure.  Is on spiron-hct 25-25 and klor con 20 meq.  Needs fluid pill as feet swell.  Advise please. Initial call taken by: Gladis Riffle, RN,  July 19, 2009 7:52 AM  Follow-up for Phone Call        stay on med as prescribed sometimes the two meds can cause elevated potassium---not in her case Follow-up by: Birdie Sons MD,  July 19, 2009 2:56 PM  Additional Follow-up for Phone Call Additional follow up Details #1::        Left message on machine. Pt to call back. Gladis Riffle, RN  July 19, 2009 3:06 PM   Patient notified.  Additional Follow-up by: Gladis Riffle, RN,  July 19, 2009 3:22 PM

## 2010-05-02 NOTE — Assessment & Plan Note (Signed)
Summary: sore throat/dm   Vital Signs:  Patient profile:   70 year old female Temp:     98.0 degrees F oral BP sitting:   120 / 84  (left arm) Cuff size:   regular  Vitals Entered By: Sid Falcon LPN (June 16, 2009 3:05 PM) CC: Sore throat, headache   History of Present Illness: Acute visit. Patient seen with progressive symptoms of sore throat and frontal and ethmoid sinus pressure the past several days. She just finished treatment with amoxicillin for urinary tract infection. Some yellow tinged nasal mucous. Postnasal drainage. No headaches. Rare dry cough. Symptoms progressive over the past several days.  Allergies: 1)  ! 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 Ears:  External ear exam shows no significant lesions or deformities.  Otoscopic examination reveals clear canals, tympanic membranes are intact bilaterally without bulging, retraction, inflammation or discharge. Hearing is grossly normal bilaterally. Nose:  External nasal examination shows no deformity or inflammation. Nasal mucosa are pink and moist without lesions or exudates. Mouth:  Oral mucosa and  oropharynx without lesions or exudates.  Teeth in good repair. Neck:  No deformities, masses, or tenderness noted. Lungs:  Normal respiratory effort, chest expands symmetrically. Lungs are clear to auscultation, no crackles or wheezes. Heart:  normal rate and regular rhythm.     Impression & Recommendations:  Problem # 1:  SINUSITIS, ACUTE (ICD-461.9) Rapid strep neg.  Suspect sore throat sec to PND. The following medications were removed from the medication list:    Ampicillin 500 Mg Caps (Ampicillin) .Marland Kitchen... Take 1 tablet by mouth three times a day x 7 days Her updated medication list for this problem includes:    Fluticasone Propionate 50 Mcg/act Susp (Fluticasone propionate) .Marland Kitchen... 2 sprays each nostril once daily    Azithromycin 250 Mg Tabs (Azithromycin) .Marland Kitchen... 2 by mouth today then one by mouth once daily for 4 days  Complete Medication List: 1)  Spironolactone-hctz 25-25 Mg Tabs (Spironolactone-hctz) .... One by mouth daily 2)  Premarin 0.3 Mg Tabs (Estrogens conjugated) .... Take 1 tablet by mouth once a day 3)  Meclizine Hcl 25 Mg Tabs (Meclizine hcl) .... One every 12 hours as needed 4)  Omeprazole 40 Mg Cpdr (Omeprazole) .... Take 1 tablet by mouth once daily 30 minutes before breakfast. pharmacy-please d/c aciphex rx..insurance will not cover. 5)  Vitamin D (ergocalciferol) 50000 Unit Caps (Ergocalciferol) .... One tab by mouth q week for 12 weeks-recheck labs in 16 weeks. 6)  Fluticasone Propionate 50 Mcg/act Susp (Fluticasone propionate) .... 2 sprays each nostril once daily 7)  Fluconazole 150 Mg Tabs (Fluconazole) .... Take 1 tablet by mouth once a day x 3 days 8)  Lomotil 2.5-0.025 Mg Tabs (Diphenoxylate-atropine) .... Take 1 tablet by mouth two times a day as needed--not to exceed 3 day use 9)  Azithromycin 250 Mg Tabs (Azithromycin) .... 2 by  mouth today then one by mouth once daily for 4 days  Other Orders: Rapid Strep (40347)  Patient Instructions: 1)  Acute sinusitis  symptoms for less than 10 days are not helped by antibiotics. Use warm moist compresses, and over the counter decongestants( only as directed). Call if no improvement in 5-7 days, sooner if increasing pain, fever, or new symptoms.  Prescriptions: FLUCONAZOLE 150 MG TABS (FLUCONAZOLE) Take 1 tablet by mouth once a day x 3 days  #3 x 0   Entered and Authorized by:   Evelena Peat MD   Signed by:   Evelena Peat MD on 06/16/2009   Method used:   Electronically to        [facility] (retail)       [address]       [place], [place]  [id number]       Ph: [phone] or [phone]       Fax: [phone]   RxID:   [id number] AZITHROMYCIN 250 MG TABS (AZITHROMYCIN) 2 by mouth today then one by mouth once daily for 4 days  #6 x 0   Entered and Authorized by:   Evelena Peat MD   Signed by:   Evelena Peat MD on 06/16/2009   Method used:   Electronically to        [facility] (retail)       [address]       [place], [place]  [address]       Ph: [phone] or [phone]       Fax: [phone]   RxID:   [id number]   Laboratory Results  Date/Time Received: June 16, 2009 3:10 PM  Date/Time Reported: June 16, 2009 3:10 PM   Other Tests  Rapid Strep: negative Comments Wynona Canes, CMA  June 16, 2009 3:10 PM

## 2010-05-02 NOTE — Assessment & Plan Note (Signed)
Summary: Cardiology Nuclear Testing  ] Nuclear Med Background Indications for Stress Test: Evaluation for Ischemia   History: Abnormal EKG, Echo, Myocardial Perfusion Study  History Comments: '05 Echo:EF=55-65%; '06 ZOX:WRUEAV, EF= 53%  Symptoms: Chest Tightness, Dizziness, Fatigue, Nausea, Rapid HR  Symptoms Comments: Chest tightness>left arm. Last episode of WU:JWJX weekend.   Nuclear Pre-Procedure Cardiac Risk Factors: Family History - CAD, History of Smoking, Lipids Caffeine/Decaff Intake: None NPO After: 7:30 PM Lungs: Clear.  O2 Sat 98% on RA. IV 0.9% NS with Angio Cath: 22g     IV Site: Rt AC IV Started by: Bonnita Levan RN Chest Size (in) 36     Cup Size C     Height (in): 65.5 Weight (lb): 171 BMI: 28.12 Tech Comments: Patient stated she was unable to walk treadmill using the Bruce protocol, converted to low level lexiscan.  Nuclear Med Study 1 or 2 day study:  1 day     Stress Test Type:  treadmill/Lexiscan Reading MD:  Cassell Clement, MD     Referring MD:  Tonny Bollman, MD Resting Radionuclide:  Technetium 68m Tetrofosmin     Resting Radionuclide Dose:  11.0 mCi  Stress Radionuclide:  Technetium 19m Tetrofosmin     Stress Radionuclide Dose:  33.0 mCi   Stress Protocol Exercise Time (min):  2:00 min     Max HR:  98 bpm     Predicted Max HR:  152 bpm  Max Systolic BP: 127 mm Hg     Percent Max HR:  64.47 %Rate Pressure Product:  91478  Lexiscan: 0.4 mg   Stress Test Technologist:  Rea College CMA-N     Nuclear Technologist:  Domenic Polite CNMT  Rest Procedure  Myocardial perfusion imaging was performed at rest 45 minutes following the intravenous administration of Myoview Technetium 56m Tetrofosmin.  Stress Procedure  The patient received IV Lexiscan 0.4 mg over 15-seconds.  Myoview injected at 30-seconds and the patient continued to walk for one minute.  There were no significant changes with infusion, only occasional PVC's.  Quantitative spect images  were obtained after a 45 minute delay.  QPS Raw Data Images:  Normal; no motion artifact; normal heart/lung ratio. Stress Images:  There is normal uptake in all areas. Rest Images:  Normal homogeneous uptake in all areas of the myocardium. Subtraction (SDS):  No evidence of ischemia. Transient Ischemic Dilatation:  0.95  (Normal <1.22)  Lung/Heart Ratio:  0.27  (Normal <0.45)  Quantitative Gated Spect Images QGS EDV:  82 ml QGS ESV:  29 ml QGS EF:  65 % QGS cine images:  Normal LV systolic function.  Findings Normal nuclear study      Overall Impression  Exercise Capacity: Adenosine study with no exercise. BP Response: Normal blood pressure response. Clinical Symptoms: No chest pain ECG Impression: No significant ST segment change suggestive of ischemia. Overall Impression: Normal stress nuclear study. Overall Impression Comments: No ischemia. Normal LV systolic function.  Appended Document: Cardiology Nuclear Testing reviewed results with patient by telephone.

## 2010-05-02 NOTE — Miscellaneous (Signed)
Summary: Orders Update  Clinical Lists Changes  Orders: Added new Referral order of Urology Referral (Urology) - Signed 

## 2010-05-02 NOTE — Assessment & Plan Note (Signed)
Summary: UTI? // RS   Vital Signs:  Patient profile:   70 year old female Weight:      173 pounds Temp:     98.3 degrees F oral BP sitting:   120 / 68  Vitals Entered By: Lynann Beaver CMA (December 19, 2009 4:12 PM) CC: Uti? Is Patient Diabetic? No Pain Assessment Patient in pain? no        History of Present Illness: Patient here with urinary symptoms of frequency and burning with urination for 4 days now. Intermittent low back pain and suprapubic pressure. No nausea or vomiting. No fever. Similar symptoms with UTI in the past.  No alleviating features.  Right thumb laceration Saturday night. Accidentally cut with one of her kitchen knives. She's been keeping clean and dressed. No signs of secondary infection.  Allergies: 1)  ! 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    Physical Exam  General:  Well-developed,well-nourished,in no acute distress; alert,appropriate and cooperative throughout examination Lungs:  Normal respiratory effort, chest expands symmetrically. Lungs are clear to auscultation, no crackles or wheezes. Heart:  normal rate and regular rhythm.   Extremities:  R thumb 1 cm laceration, nongaping, with no erythema or drainage.   Laceration is  volar surface of thumb distal pulp.  Full ROM thumb   Impression & Recommendations:  Problem # 1:  DYSURIA (ICD-788.1) Assessment New ?UTI.  Start Cipro and urine cx. Orders: UA Dipstick w/o Micro (automated)  (81003) T-Culture, Urine ([id number]) Prescription Created Electronically [phone])  Her updated medication list for this problem includes:    Ciprofloxacin Hcl 250 Mg Tabs (Ciprofloxacin hcl) ..... One by mouth two times a day for 7 days  Problem # 2:  LACERATION, RIGHT THUMB (ICD-883.0) Assessment: New no signs of sec infection.  steristrip applied and bandaged.  Complete Medication List: 1)  Spironolactone-hctz 25-25 Mg Tabs (Spironolactone-hctz) .... One by mouth daily 2)  Premarin 0.3 Mg Tabs (Estrogens conjugated) .... Take 1 tablet by mouth once a day 3)  Meclizine Hcl 25 Mg Tabs (Meclizine hcl) .... One every 12 hours as needed 4)  Vitamin D (ergocalciferol) 50000 Unit Caps (Ergocalciferol) .... One tab by mouth q week for 12 weeks-recheck labs in 16 weeks. 5)  Lomotil 2.5-0.025 Mg Tabs (Diphenoxylate-atropine) .... Take 1 tablet by mouth two times a day as needed--not to exceed 3 day use 6)  K-lor 20 Meq Pack (Potassium chloride) .... One by mouth daily as needed 7)  Zantac 150 Mg Tabs (Ranitidine hcl) .Marland Kitchen.. 1 by mouth once daily as needed 8)  Ciprofloxacin Hcl 250 Mg Tabs (Ciprofloxacin hcl) .... One by mouth two times a day for 7 days  Patient Instructions: 1)  Drink plenty of fluids up to 3-4 quarts a day. Cranberry juice is especially recommended in addition to large amounts of water. Avoid caffeine & carbonated drinks, they tend to irritate the bladder, Return in 3-5 days if you're not better: sooner if you're feeling worse.  Prescriptions:  CIPROFLOXACIN HCL 250 MG TABS (CIPROFLOXACIN HCL) one by mouth two times a day for 7 days  #14 x 0   Entered and Authorized by:   Evelena Peat MD   Signed by:   Evelena Peat MD on 12/19/2009   Method used:    Electronically to        [facility] (retail)       [address]       [place], [place]  [address]       Ph: [phone] or [phone]       Fax: [phone]   RxID:   [id number]   Laboratory Results   Urine Tests  Date/Time Recieved: December 19, 2009 4:06 PM  Date/Time Reported: December 19, 2009 4:06 PM   Routine Urinalysis   Color: yellow Appearance: Clear Glucose: negative   (Normal Range: Negative) Bilirubin: negative   (Normal Range: Negative) Ketone: negative   (Normal Range: Negative) Spec. Gravity: >=1.030   (Normal Range: 1.003-1.035) Blood: negative   (Normal Range: Negative) pH: 5.0   (Normal Range: 5.0-8.0) Protein: negative   (Normal Range: Negative) Urobilinogen: 0.2   (Normal Range: 0-1) Nitrite: negative   (Normal Range: Negative) Leukocyte Esterace: negative   (Normal Range: Negative)    Comments: Wynona Canes, CMA  December 19, 2009 4:06 PM

## 2010-05-02 NOTE — Progress Notes (Signed)
Summary: ua results  Phone Note Other Incoming   Caller: Patient Call For: Birdie Sons MD Summary of Call: Pt brought urine in to lab for ua.  Results nl.  Do you want culture? Caller: nana in our lab Summary of Call: pt brought in urine for ua.  Nl results.  Do you want culture? Initial call taken by: Gladis Riffle, RN,  June 02, 2009 2:00 PM  Follow-up for Phone Call        yes, please culture Follow-up by: Birdie Sons MD,  June 02, 2009 3:46 PM  Additional Follow-up for Phone Call Additional follow up Details #1::        Lab notified. Additional Follow-up by: Lynann Beaver CMA,  June 02, 2009 3:56 PM

## 2010-05-02 NOTE — Progress Notes (Signed)
Summary: ov to see dr Caryl Never  Phone Note Call from Patient Call back at [facility] Phone [phone]   Summary of Call: Still has a bladder infection. has green bm's. Initial call taken by: Warnell Forester,  January 20, 2010 9:12 AM  Follow-up for Phone Call        lmoam to make an appt. Follow-up by: Warnell Forester,  January 20, 2010 9:14 AM

## 2010-05-02 NOTE — Assessment & Plan Note (Signed)
Summary: COUGH, CONGESTION, // RS   Vital Signs:  Patient profile:   70 year old female Weight:      174 pounds Temp:     98.1 degrees F oral BP sitting:   148 / 100  (left arm) Cuff size:   regular  Vitals Entered By: Alfred Levins, CMA (January 31, 2010 1:39 PM) CC: chills, drainage, st onset this am   Primary Care Provider:  Birdie Sons, MD  CC:  chills, drainage, and st onset this am.  History of Present Illness: 70 year old patient the low this morning with sore throat, chest congestion, and some cough.  Cough is nonproductive.  There's been no chest pain, shortness of breath or wheezing.  Denies any fever or chills.  Her chief complaint is sore throat.  He has a history of mild dyslipidemia, and osteoarthritis.  She has been treated for a urinary tract infection within the past month.  Allergies: 1)  ! Sulfa 2)  Codeine Phosphate (Codeine Phosphate) 3)  ?penicillin V Potassium (Penicillin V Potassium) 4)  Morphine Sulfate (Morphine Sulfate)  Past History:  Past Medical History: Reviewed history from 09/01/2008 and no changes required. 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  The patient denies fever, weight loss, weight gain, vision loss, decreased hearing, chest pain, syncope, dyspnea on exertion, peripheral edema, headaches, hemoptysis, abdominal pain, melena, hematochezia, severe indigestion/heartburn, hematuria, incontinence, genital sores, muscle weakness, suspicious skin lesions, transient blindness, difficulty walking,  depression, unusual weight change, abnormal bleeding, enlarged lymph nodes, angioedema, and breast masses.    Physical Exam  General:  Well-developed,well-nourished,in no acute distress; alert,appropriate and cooperative throughout examination Head:  Normocephalic and atraumatic without obvious abnormalities. No apparent alopecia or balding. Eyes:  No corneal or conjunctival inflammation noted. EOMI. Perrla. Funduscopic exam benign, without hemorrhages, exudates or papilledema. Vision grossly normal. Ears:  External ear exam shows no significant lesions or deformities.  Otoscopic examination reveals clear canals, tympanic membranes are intact bilaterally without bulging, retraction, inflammation or discharge. Hearing is grossly normal bilaterally. Mouth:  pharyngeal erythema.  pharyngeal erythema.   Neck:  No deformities, masses, or tenderness noted. Lungs:  Normal respiratory effort, chest expands symmetrically. Lungs are clear to auscultation, no crackles or wheezes. Heart:  Normal rate and regular rhythm. S1 and S2 normal without gallop, murmur, click, rub or other extra sounds. Abdomen:  Bowel sounds positive,abdomen soft and non-tender without masses, organomegaly or hernias noted.   Impression & Recommendations:  Problem # 1:  URI (ICD-465.9)  Problem # 2:  PHARYNGITIS-ACUTE (ICD-462)  Her updated medication list for this problem includes:    Ciprofloxacin Hcl 250 Mg Tabs (Ciprofloxacin hcl) ..... One by mouth two times a day for 7 days  Her updated medication list for this problem includes:    Ciprofloxacin Hcl 250 Mg Tabs (Ciprofloxacin hcl) ..... One by mouth two times a day for 7 days  Complete Medication List: 1)  Spironolactone-hctz 25-25 Mg Tabs (Spironolactone-hctz) .... One by mouth daily 2)  Premarin 0.3 Mg Tabs (Estrogens conjugated) .... Take 1 tablet by mouth once a day 3)  Meclizine Hcl 25 Mg Tabs (Meclizine hcl) .... One every 12 hours as needed 4)  Vitamin D  (ergocalciferol) 50000 Unit Caps (Ergocalciferol) .... One tab by mouth q week for 12 weeks-recheck labs in 16 weeks. 5)  Lomotil 2.5-0.025 Mg Tabs (Diphenoxylate-atropine) .... Take 1 tablet by mouth two times a day as needed--not to exceed 3 day use 6)  K-lor 20 Meq Pack (Potassium chloride) .... One by mouth daily as needed 7)  Zantac 150 Mg Tabs (Ranitidine hcl) .Marland Kitchen.. 1 by mouth once daily as needed 8)  Ciprofloxacin Hcl 250 Mg Tabs (Ciprofloxacin hcl) .... One by mouth two times a day for 7 days 9)  Diflucan 150 Mg Tabs (Fluconazole) .... One by mouth now  Patient Instructions: 1)  Take 400-600mg  of Ibuprofen (Advil, Motrin) with food every 4-6 hours as needed for relief of pain or comfort of fever. 2)  Please schedule a follow-up appointment as needed.   Orders Added: 1)  Est. Patient Level III [[address]]

## 2010-05-02 NOTE — Miscellaneous (Signed)
  Medications Added K-LOR 20 MEQ PACK (POTASSIUM CHLORIDE) one by mouth daily       Clinical Lists Changes  Medications: Added new medication of K-LOR 20 MEQ PACK (POTASSIUM CHLORIDE) one by mouth daily - Signed Rx of K-LOR 20 MEQ PACK (POTASSIUM CHLORIDE) one by mouth daily;  #30 x 0;  Signed;  Entered by: Lynann Beaver CMA;  Authorized by: Birdie Sons MD;  Method used: Electronically to [facility] [address] [place], [place]  [id number], Ph: [phone] or [phone], Fax: [phone]    Prescriptions: K-LOR 20 MEQ PACK (POTASSIUM CHLORIDE) one by mouth daily  #30 x 0   Entered by:   Lynann Beaver CMA   Authorized by:   Birdie Sons MD   Signed by:   Lynann Beaver CMA on 07/14/2009   Method used:   Electronically to        [facility] (retail)       [address]       [place], [place]  [id number]       Ph: [phone] or [phone]       Fax: [phone]   RxID:   [id number]

## 2010-05-02 NOTE — Letter (Signed)
Summary: Colonoscopy Date Change Letter  [facility]  [address] [place], [place] [address]   Phone: [phone]  Fax: [phone]      October 05, 2009 MRN: [id number]   [facility] [address] [place], [place]  [address]   Dear Brittney Fox,   Previously you were recommended to have a repeat colonoscopy around this time. Your chart was recently reviewed by Dr. Hedwig Morton. Juanda Chance of [facility]. Follow up colonoscopy is now recommended in August 2013. This revised recommendation is based on current, nationally recognized guidelines for colorectal cancer screening and polyp surveillance. These guidelines are endorsed by the American Cancer Society, [employer] on Colorectal Cancer as well as numerous other major medical organizations.  Please understand that our recommendation assumes that you do not have any new symptoms such as bleeding, a change in bowel habits, anemia, or significant abdominal discomfort. If you do have any concerning GI symptoms or want to discuss the guideline recommendations, please call to arrange an office visit at your earliest convenience. Otherwise we will keep you in our reminder system and contact you 1-2 months prior to the date listed above to schedule your next colonoscopy.  Thank you,  Hedwig Morton. Juanda Chance, M.D.  [facility] [phone]

## 2010-05-02 NOTE — Assessment & Plan Note (Signed)
Summary: ? hemorrhoid/Brittney Fox    History of Present Illness Visit Type: Follow-up Visit Primary GI MD: Lina Sar MD Primary Provider: Birdie Sons, MD Chief Complaint: rectal lesion felt on exam at gynecologist History of Present Illness:   This is a 70 year old white female with a history of irritable bowel syndrome who is currently under good control. She had a recent gynecological exam by Dr. Corrie Mckusick who noticed hemorrhoids in the anal canal. Patient denies any rectal pain or bleeding. She had several rectal surgeries as a child for an anal fissure. Her last colonoscopy in 2006 was normal. A recall colonoscopy will be due in 2013. A CT scan of the abdomen in June 2010 showed fatty liver and small duodenal diverticulum. It also showed her to be status post cholecystectomy and diverticuli in the left colon. Her bowel habits are regular. There is no family history of colon cancer.   GI Review of Systems      Denies abdominal pain, acid reflux, belching, bloating, chest pain, dysphagia with liquids, dysphagia with solids, heartburn, loss of appetite, nausea, vomiting, vomiting blood, weight loss, and  weight gain.        Denies anal fissure, black tarry stools, change in bowel habit, constipation, diarrhea, diverticulosis, fecal incontinence, heme positive stool, hemorrhoids, irritable bowel syndrome, jaundice, light color stool, liver problems, rectal bleeding, and  rectal pain.    Current Medications (verified): 1)  Spironolactone-Hctz 25-25 Mg  Tabs (Spironolactone-Hctz) .... One By Mouth Daily 2)  Premarin 0.3 Mg Tabs (Estrogens Conjugated) .... Take 1 Tablet By Mouth Once A Day 3)  Meclizine Hcl 25 Mg Tabs (Meclizine Hcl) .... One Every 12 Hours As Needed 4)  Vitamin D (Ergocalciferol) 50000 Unit Caps (Ergocalciferol) .... One Tab By Mouth Q Week For 12 Weeks-Recheck Labs in 16 Weeks. 5)  Fluticasone Propionate 50 Mcg/act  Susp (Fluticasone Propionate) .... 2 Sprays Each Nostril  Once Daily As Needed 6)  Fluconazole 150 Mg Tabs (Fluconazole) .... Take 1 Tablet By Mouth As Needed With Antibiotic 7)  Lomotil 2.5-0.025 Mg Tabs (Diphenoxylate-Atropine) .... Take 1 Tablet By Mouth Two Times A Day As Needed--Not To Exceed 3 Day Use 8)  K-Lor 20 Meq Pack (Potassium Chloride) .... One By Mouth Daily As Needed 9)  Zantac 150 Mg Tabs (Ranitidine Hcl) .Marland Kitchen.. 1 By Mouth Once Daily As Needed  Allergies (verified): 1)  ! Sulfa 2)  Codeine Phosphate (Codeine Phosphate) 3)  ?penicillin V Potassium (Penicillin V Potassium) 4)  Morphine Sulfate (Morphine Sulfate)  Past History:  Past Medical History: Reviewed history from 09/01/2008 and no changes required. 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 Hysterectomy,bx--1990 Total knee replacement X2--both knees  lens implants Cholecystectomy Dental Implants Cataract extraction-bilateral Foot Surgery  Family History: Reviewed history from 09/01/2008 and no changes required. mother: alive No FH of Colon Cancer: Family History of Ovarian Cancer: Grandmother Family History of Diabetes: Grandfather Family History of Heart Disease: Father  Social History: Reviewed history from 09/01/2008 and no changes required. Married Occupation: Retired Veterinary surgeon Alcohol Use - yes-rare Illicit Drug Use - no  Review of Systems       The patient complains of back pain and urination changes/pain.  The patient denies allergy/sinus, anemia, anxiety-new,  arthritis/joint pain, blood in urine, breast changes/lumps,  change in vision, confusion, cough, coughing up blood, depression-new, fainting, fatigue, fever, headaches-new, hearing problems, heart murmur, heart rhythm changes, itching, muscle pains/cramps, night sweats, nosebleeds, shortness of breath, skin rash, sleeping problems, sore throat, swelling of feet/legs, swollen lymph glands, thirst - excessive, urination - excessive, urine leakage, vision changes, and voice change.         Pertinent positive and negative review of systems were noted in the above HPI. All other ROS was otherwise negative.   Vital Signs:  Patient profile:   70 year old female Height:      65.5 inches Weight:      173 pounds BMI:     28.45 Pulse rate:   80 / minute Pulse rhythm:   regular BP sitting:   124 / 78  (left arm)  Vitals Entered By: Milford Cage NCMA (December 15, 2009 8:32 AM)  Physical Exam  General:  Well developed, well nourished, no acute distress. Neck:  Supple; no masses or thyromegaly. Lungs:  Clear throughout to auscultation. Heart:  Regular rate and rhythm; no murmurs, rubs,  or bruits. Abdomen:  Soft, nontender and nondistended. No masses, hepatosplenomegaly or hernias noted. Normal bowel sounds. Rectal:  rectal and anoscopic exam reveals normal perianal area. Rectal tone is normal to slightly decreased. There are prominent papilla at the dentate line with one large elongated papillae protruding through the anal canal. It is about 10 mm long. It is fibrotic but shows no stigmata of bleeding. She has 2 more small papillae  in the anal canal. Stool is Hemoccult negative. There are no significant internal hemorrhoids.   Impression & Recommendations:  Problem # 1:  IRRITABLE BOWEL SYNDROME (ICD-564.1) under good control on a high-fiber diet.  Problem # 2:  EXTERNAL HEMORRHOIDS, THROMBOSED (ICD-455.4) Patient actually has hyprtrophied dentate line papillae that is protruding through the anal canal. There is no evidence of a thrombosed hemorrhoid at  this time. Since she is asymptomatic and has no occult bleeding, I would leave it alone. The papillae could represent an old scar from prior surgery when she was a child for her anal fissure. It does not seem to bother her.  Patient Instructions: 1)  nothing has to be done for therectal protrusion at this time. 2)  Recall colonoscopy in 2013. 3)  High-fiber diet. 4)  Copy sent to : Dr Corrie Mckusick 5)  The medication list was reviewed and reconciled.  All changed / newly prescribed medications were explained.  A complete medication list was provided to the patient / caregiver.

## 2010-05-04 NOTE — Letter (Signed)
Summary: [facility]  [facility]   Imported By: Maryln Gottron 03/16/2010 15:58:31  _____________________________________________________________________  External Attachment:    Type:   Image     Comment:   External Document

## 2010-07-10 ENCOUNTER — Telehealth: Payer: Self-pay | Admitting: Internal Medicine

## 2010-07-10 MED ORDER — CILIDINIUM-CHLORDIAZEPOXIDE 2.5-5 MG PO CAPS: ORAL_CAPSULE | ORAL | Status: DC

## 2010-07-10 MED ORDER — OMEPRAZOLE 20 MG PO CPDR: 20.0000 mg | DELAYED_RELEASE_CAPSULE | Freq: Two times a day (BID) | ORAL | Status: DC

## 2010-07-10 NOTE — Telephone Encounter (Signed)
Patient advised of Dr Regino Schultze recommendations.  She will call back after a week if her symptoms don't improve.

## 2010-07-10 NOTE — Telephone Encounter (Signed)
Patient is c/o reflux and abdominal pain at night "stomach ache".  She is having diarrhea frequently.  She is taking zantac with very little relief. Dr Juanda Chance your schedule is full for some time and she is requesting an appointment.  Please advise.

## 2010-07-10 NOTE — Telephone Encounter (Signed)
She has a hx of IBS. She may try a PPI bid, and Librax 1 po tid. If no better in 5-7 days, see P.A. Fortunato Curling

## 2010-07-11 ENCOUNTER — Other Ambulatory Visit: Payer: Self-pay | Admitting: Internal Medicine

## 2010-07-11 DIAGNOSIS — R197 Diarrhea, unspecified: Secondary | ICD-10-CM

## 2010-07-11 MED ORDER — OMEPRAZOLE 20 MG PO CPDR: 20.0000 mg | DELAYED_RELEASE_CAPSULE | Freq: Two times a day (BID) | ORAL | Status: DC

## 2010-07-11 MED ORDER — CILIDINIUM-CHLORDIAZEPOXIDE 2.5-5 MG PO CAPS: ORAL_CAPSULE | ORAL | Status: DC

## 2010-08-15 NOTE — Assessment & Plan Note (Signed)
[facility]                                 ON-CALL NOTE   EMMACLAIRE, SWITALA              MRN:          [id number]  DATE:09/01/2006                            DOB:          1940/06/22    PHONE NUMBER:  [phone].   TIME OF CALL:  8:39 a.m.   Archie called today complaining of 4 days worth of diarrhea.  This is after  eating [place] food.  Her mother has also had some symptoms, though not  as protracted.  She has had no nausea, no vomiting, no abdominal pain,  no bleeding.  Possibly low-grade fever, though not documented.  She is  otherwise eating and functioning well.  She has used Lomotil and Imodium  with only modest relief.  She did have some antibiotics about 2 months  ago for teeth cleaning.  She does have a tendency towards irritable  bowel.  I told Sweet that she most likely has a viral gastroenteritis.  She could have something atypical such as Clostridium difficile or  Giardia.  I doubt this is bacterial enteric pathogen by history.  I told  her that I would empirically prescribe metronidazole 250 mg q.i.d. for 7  days.  As well, I recommended that she continue to use antidiarrheals  and make sure that she hydrates well.  Should her symptoms persist or  worsen, she was instructed to contact the office at which point she  could be given further direction by Dr. Juanda Chance or one of our associates.     Wilhemina Bonito. Marina Goodell, MD  Electronically Signed    JNP/MedQ  DD: 09/01/2006  DT: 09/01/2006  Job #: [id number]   cc:   Hedwig Morton. Juanda Chance, MD

## 2010-08-18 NOTE — Assessment & Plan Note (Signed)
[facility]                                   ON-CALL NOTE   Brittney Fox, Brittney Fox            MRN:          [id number]  DATE:01/19/2006                            DOB:          12-30-1940    PRIMARY CARE PHYSICIAN:  Dr. Cato Mulligan.   Phone #: [phone].   SUBJECTIVE:  Possible bladder infection and also some abdominal pain and  discolored urine.   ASSESSMENT AND PLAN:  The patient told to come to the Saturday clinic.       [name], MD      AB/MedQ  DD:  01/20/2006  DT:  01/21/2006  Job #:  [id number]   cc:   Valetta Mole. Swords, MD

## 2010-08-18 NOTE — Discharge Summary (Signed)
[name], [name] NO.:  [id number]   MEDICAL RECORD NO.:  [id number]                   PATIENT TYPE:   LOCATION:                                       FACILITY:   PHYSICIAN:  Ollen Gross, M.D.                 DATE OF BIRTH:   DATE OF ADMISSION:  08/24/2003  DATE OF DISCHARGE:  08/29/2003                                 DISCHARGE SUMMARY   ADMISSION DIAGNOSES:  1. Osteoarthritis left knee.  2. History of fluid retention.   DISCHARGE DIAGNOSES:  1. Osteoarthritis left knee status post left knee total arthroplasty.  2. Postoperative hyponatremia improved.  3. Postoperative hypokalemia improved.  4. Ventricular bigeminy resolved with lidocaine bolus.   CONSULTATIONS:  Cardiology, Dr. Vida Roller.   BRIEF HISTORY:  [name] is a 70 year old female with severe end-stage  arthritis of the left knee and the pain has been rapidly progressing.  She  has failed nonoperative management including injections, now presents for  total knee arthroplasty.   LABORATORY DATA:  CBC on admission:  Hemoglobin of 14.9, hematocrit of 45,  white cell count 6.7, differential within normal limits.  Postop H&H 12.2  and 36.5.  Follow up CBC:  Hemoglobin 11.6, hematocrit of 34.7, white count  up to 16.7 showed neutrophils 85%, lymphs 10, monos 5, eos 0, basos 0.  Last  noted H&H was 12 and 35.5.  PT/PTT preop 12 and 30 respectively and INR 0.8.  Serial pro times followed per Coumadin protocol.  Last noted PT/INR 15.8 and  1.4.  Chem panel on admission all within normal limits.  Serial BMETs were  followed.  Sodium did drop from 141 to 129, potassium dropped from 3.7 to  3.3.  Last noted BMET showed sodium back up to 133, potassium back up 3.9.  Glucose went up from 94 to 143, back down to 139.  Magnesium level taken on  September 26, 2003 normal at 1.5.  Serial CK and cardiac enzymes were taken 3  sets.  First set:  CK 56, CK-MB 1.4, troponin 0.02.  Second set:   CK 49, CK-  MB 1.4, troponin 0.02.  Third set:  CK 52, CK-MB 1.2, troponin 0.02.  Lipid  profile 164, triglycerides 132, HDL cholesterol 42, VLDL cholesterol 26, LDL  96, total cholesterol versus HDL ratio 3.9.  TSH low at 0.677, free T4 1.  Preop UA:  Moderate hemoglobin, small leukocyte esterase, rare epithelial  cells, 0-2 white, 3-6 red, few bacteria.  Follow up UA:  Small leukocyte  esterase, 0-2 white, rare bacteria.  Blood group type is O positive.  Urine  culture x3 no growth.  Transthoracic echo:  Overall left ventricular  systolic function was normal, left ventricular ejection fraction was  estimated ranging between 55 and 65%, left ventricular wall thickness was  mildly increased, right atrium was mildly dilated.  The patient with  frequent ventricular bigeminy  during evaluation impression.   HOSPITAL COURSE:  The patient was admitted to [facility], taken to  the OR, and underwent the above-stated procedure without complication.  The  patient tolerated the procedure well, later returned to recovery room, and  then to the orthopedic floor.  She was noted to have elevated pulse rate  with 5 beats of ventricular tachycardia, asymptomatic.  Cardiology consult  was called.  The patient was seen in consultation by Dr. Vida Roller.  She had actually developed bigeminy in the PACU.  She had received 100 mg  bolus of IV lidocaine, was in sinus rhythm when she was seen by cardiology.  She was transferred to the telemetry floor postop where she was followed.  It, was noted that she had a couple of irregular beats through the night.  She did not get much rest that first night.  Hemovac which was placed at the  time of surgery was pulled.  She did have a 2-D echo as above.  On the  evening of postop day #1 she did have some elevated temps and the patient  was quite concerned.  The covering service P.A., Ralene Bathe, was called  several times.  The patient had multiple  concerns.  French Ana did come in and  talk to her.  She had a preop UTI which was treated by Dr. Cato Mulligan and she  was on Cipro.  She did not show any signs of infection.  Chest x-ray was  checked along with a UA.  Labs were ordered as above.  CPM was held.  The  following day, day #2, things were reviewed and discussed with the patient  by Dr. Lequita Halt.  She was doing much better by day #2.  She was having less  pain.  She did still have a little bit of temp that night, it had come down.  It was as high as 101.1 but it was back down to 100.7.  This was discussed  with her at length.  The chest x-ray ordered postop was negative.  She only  had a small leukocyte esterase, rare epithelial cells.  She did have a drop  in her sodium and potassium.  Her fluids were discontinued for the low  sodium.  She was placed on potassium supplements.  PCAs were discontinued,  Foley was discontinued.  She was found to have frequent bigeminy by  cardiology.  Potassium was repleted.  The 2-D echo was essentially normal.  Electrolytes did improve after discontinuing the fluids and addition of the  potassium.  From a therapy standpoint, once she started her initial therapy  after the monitoring on telemetry, she actually did very well.  By day #2,  she was already ambulating 150 feet and continued to ambulate with therapy.  She progressed well and once she was cleared by cardiology on Aug 29, 2003  she was doing much better.  She was afebrile.  Pulse was controlled and it  was decided the patient would be discharged home.   DISCHARGE PLAN:  1. The patient discharged home on Aug 29, 2003.  2. Discharge diagnoses please see above.  3. Discharge meds:  Percocet, Robaxin, and Coumadin.  Resume home meds.  4. Diet as tolerated, salt restricted.  5. Activity:  Weightbearing as tolerated.  Home health PT and home health     nursing for total knee protocol. 6. Follow up two weeks for Dr. Lequita Halt and call the office for  an     appointment.  Follow up with Dr. Daleen Squibb to schedule adenosine Cardiolite     outpatient.   DISPOSITION:  To home.   CONDITION ON DISCHARGE:  Improved.     Alexzandrew L. Julien Girt, P.A.              Ollen Gross, M.D.    ALP/MEDQ  D:  10/06/2003  T:  10/06/2003  Job:  [id number]   cc:   Valetta Mole. Swords, M.D. [facility]   Maisie Fus C. Wall, M.D.   Vida Roller, M.D.  Fax: [phone]

## 2010-08-18 NOTE — H&P (Signed)
NAME:  Brittney Fox, Brittney Fox     ACCOUNT NO.:  [id number]   MEDICAL RECORD NO.:  [id number]          PATIENT TYPE:  INP   LOCATION:  1505                         FACILITY:  [facility]   PHYSICIAN:  Ollen Gross, M.D.    DATE OF BIRTH:  November 15, 1940   DATE OF ADMISSION:  09/17/2005  DATE OF DISCHARGE:  09/20/2005                                HISTORY & PHYSICAL   DATE OF OFFICE VISIT HISTORY AND PHYSICAL:  September 06, 2005.   CHIEF COMPLAINT:  Right knee pain.   HISTORY OF PRESENT ILLNESS:  The patient is a 70 year old female who is well  known to Dr. Ollen Gross, having previously undergone a left total knee  arthroplasty and doing quite well from that.  She has known arthritis in the  right knee that has been progressively getting worse.  She has reached the  point where she would like to have the other side done.  Risks and benefits  discussed.  The patient is subsequently admitted to the hospital.   ALLERGIES:  MORPHINE CAUSES RASH.   CURRENT MEDICATIONS:  Spironolactone, meclizine, dental prophylaxis in the  form of cephalexin.   PAST MEDICAL HISTORY:  History of:  1. Diverticulosis.  2. Irritable bowel syndrome.  3. History of polycythemia.  4. History of mildly elevated cholesterol.  5. History of urinary tract infections.   PAST SURGICAL HISTORY:  1. Gallbladder surgery.  2. Hysterectomy.  3. Previous foot surgery.  4. Left total knee replacement.  5. Dental implants.  6. Bilateral cataract surgery with lens implants.   SOCIAL HISTORY:  Married, a Cytogeneticist, nonsmoker, she occasionally  uses alcohol.  Two sons.  Husband will be assisting with care after surgery.   FAMILY HISTORY:  Hypertension and arthritis.   REVIEW OF SYSTEMS:  GENERAL:  No fever, chills, night sweats.  NEURO:  No  seizures, syncope, paralysis.  RESPIRATORY:  No shortness of breath, no  cough or hemoptysis.  CARDIOVASCULAR:  No chest pain or orthopnea.  GI:  No  nausea, vomiting,  diarrhea or constipation.  GU:  No dysuria or hematuria.  MUSCULOSKELETAL:  Right knee.   PHYSICAL EXAM:  VITAL SIGNS:  Pulse 60, respirations 12, blood pressure  110/64.  GENERAL:  A 69 year old white female well-nourished and well-developed, in  no acute distress.  Alert, oriented and cooperative.  HEENT:  Normocephalic.  Pupils round, reactive.  Oropharynx clear.  EOMs  intact.  NECK:  Supple.  CHEST:  Clear.  HEART:  Regular rate and rhythm, no murmur.  ABDOMEN:  Soft, nontender, bowel sounds present.  RECTAL/BREASTS/GENITALIA:  Not done.  Not pertinent to present illness.  RIGHT KNEE:  Right knee shows range of motion of 5-115, marked crepitus  noted, tender, no effusion.   IMPRESSION:  Osteoarthritis, right knee.   PLAN:  Patient will be admitted to [facility] to undergo a right  total knee arthroplasty.  Surgery will be performed by Dr. Ollen Gross.      Alexzandrew L. Julien Girt, P.A.      Ollen Gross, M.D.  Electronically Signed    ALP/MEDQ  D:  10/28/2005  T:  10/28/2005  Job:  [id number]   cc:   Valetta Mole. Swords, MD  [address] [place]  [place] [address]   Ollen Gross, M.D.  Fax: [phone]   Jesse Sans. Wall, MD, FACC  [address]  [address]  [place]  [place] [address]

## 2010-08-18 NOTE — Op Note (Signed)
NAME:  Brittney Fox, Brittney Fox     ACCOUNT NO.:  [id number]   MEDICAL RECORD NO.:  [id number]          PATIENT TYPE:  INP   LOCATION:  1505                         FACILITY:  [facility]   PHYSICIAN:  Ollen Gross, M.D.    DATE OF BIRTH:  12/01/1940   DATE OF PROCEDURE:  09/17/2005  DATE OF DISCHARGE:                                 OPERATIVE REPORT   PREOPERATIVE DIAGNOSIS:  Osteoarthritis, right knee.   POSTOPERATIVE DIAGNOSIS:  Osteoarthritis, right knee.   PROCEDURE:  Right total knee arthroplasty.   SURGEON:  Ollen Gross, M.D.   ASSISTANT:  Alanson Aly. Julien Girt, P.A.   ANESTHESIA:  Spinal.   ESTIMATED BLOOD LOSS:  Minimal.   DRAIN:  Hemovac x1.   TOURNIQUET TIME:  40 minutes at 300 mmHg.   COMPLICATIONS:  None.   CONDITION:  Stable to Recovery.   BRIEF CLINICAL NOTE:  Brittney Fox is a 70 year old female with end-stage  osteoarthritis of the right knee with intractable pain.  She has had a  previous successful left total knee arthroplasty and presents now for a  right total knee arthroplasty.   PROCEDURE IN DETAIL:  After successful administration of the spinal  anesthetic, the tourniquet was placed around the right thigh and the right  lower extremity prepped and draped in the usual sterile fashion.  Extremity  is wrapped in Esmarch, the knee flexed, the tourniquet inflated to 300 mmHg.  A midline incision was made with a #10 blade through subcutaneous tissue  through the level of the extensor mechanism.  A fresh blade was used to make  a medial parapatellar arthrotomy.  Soft tissue over the proximal medial  tibia was subperiosteally elevated to the joint line with the knife and into  the semimembranosus bursa with a Cobb elevator.  Soft tissue of the proximal  lateral tibia was also elevated, with attention being paid to avoid the  patellar tendon on the tibial tubercle.  Patella subluxed laterally, knee  flexed 90 degrees, ACL and PCL removed.  Drill was used to create a  starting hole in the distal femur.  Canal was thoroughly irrigated.  A 5-  degree right valgus alignment got its place  and referencing off the  posterior condyles.  Rotations marked, and the black pincer removed 10 mm of  the distal femur.  Distal femoral resection was made with an oscillating  saw.  Sizing blocks placed, and size 3 was most appropriate.  Rotations  marked off the epicondylar axis, and then the size-3 cutting block placed.  The anterior posterior and chamfer cuts were made.   Tibia subluxed forward, and the menisci removed.  Extramedullary tibial  alignment guide was placed, referencing proximally at the medial aspect of  the tibial tubercle and distally along the second metatarsal axis and tibial  crest.  The blocks pinned to remove about 6 mm off the more deficient  lateral side.  Tibial resection was made with an oscillating saw.  Size 3  was the most appropriate tibial component, and the proximal tibia was  prepared to a modular drill and keel punch for a size 3.  Femoral  preparation was completed with the intercondylar  cut.   Size 3 mobile bearing tibial trial with a size 3 posterior stabilized  femoral trial and a 10-mm posterior stabilized rotating platform insert  trial were placed.  With the 10, full extension was achieved with excellent  varus/valgus balance throughout full range of motion.  Patella was then  everted and thickness measured to be 20 mm.  Freehand resection was taken to  11 mm, 35 template was placed, lug holes were drilled, trial patella was  placed and tracks normally.  Osteophytes, the knee removed off the posterior  femur with the trial placed.  All trials were removed, and the cut bone  surfaces were prepared with a pulsatile lavage.  Cements mixed and, once  ready for implantation, the size 3 mobile bearing tibial tray, size 3  posterior stabilized femur, and 35 patella were cemented into place.  The  patella was held with the clamp.   Trial 10-mm inserts placed, the knee held  in full extension, and all extruded cement removed.  Once the cement was  fully hardened, then the permanent 10-mm posterior stabilized rotating  platform insert was placed into the tibial tray.  The wound was copiously  irrigated with saline solution and the extensor mechanism closed over  Hemovac drain with interrupted #1 PDS.  Flexion against gravity to 135  degrees.  Tourniquet was released then, for a total tourniquet time of 40  minutes.  Subcu was closed with interrupted 2-0 Vicryl, subcuticular with a  running 4-0 Monocryl.  The incisions cleaned and dried, and Steri-Strips  applied.  Drains hooked to suction, and a bulky sterile dressing placed.  She was placed into a knee immobilizer, awakened, and transported to  Recovery in stable condition.      Ollen Gross, M.D.  Electronically Signed     FA/MEDQ  D:  09/17/2005  T:  09/18/2005  Job:  [id number]

## 2010-08-18 NOTE — Discharge Summary (Signed)
NAME:  Brittney Fox, Brittney Fox     ACCOUNT NO.:  [id number]   MEDICAL RECORD NO.:  [id number]          PATIENT TYPE:  INP   LOCATION:  1505                         FACILITY:  [facility]   PHYSICIAN:  Ollen Gross, M.D.    DATE OF BIRTH:  29-May-1940   DATE OF ADMISSION:  09/17/2005  DATE OF DISCHARGE:  09/20/2005                                 DISCHARGE SUMMARY   ADMITTING DIAGNOSES:  1. Osteoarthritis right knee.  2. History of diverticulosis.  3. Irritable bowel syndrome.  4. History of polycythemia.  5. History of mild elevated cholesterol.  6. History of urinary tract infections.   DISCHARGE DIAGNOSES:  1. Osteoarthritis right knee, status post right total knee arthroplasty.  2. Mild acute blood loss anemia.  3. Postoperative hyponatremia, improved.  4. Postoperative hypokalemia, improved.  5. History of diverticulosis.  6. Irritable bowel syndrome.  7. History of polycythemia.  8. History of mild elevated cholesterol.  9. History of urinary tract infections.   PROCEDURE:  On September 17, 2005 right total knee.  Surgeon Dr. Lequita Halt and  assistant Avel Peace, [facility].  Spinal anesthesia.  Tourniquet time 40  minutes.   CONSULTS:  None.   BRIEF HISTORY:  Brittney Fox is a 70 year old female with end-stage osteoarthritis  and right knee intractable pain, previous successful left total knee and now  presents for right total knee.   LABORATORY DATA:  Preop CBC - hemoglobin 14.7, hematocrit of 44.2, white  cell count 60.3.  Postop hemoglobin 12.2, drifted down to 11.7 and came back  up 11.5.  Last H&H 11.9 and 35.1.  PT and PTT on admission 12.2 and 36,  respectively.  INR 0.9 and __________.  PT and INR 21.4 and 1.8.  Chem panel  on admission all within normal limits.  Serial BMETs as follows:  Sodium did  drop from 138 to 131 and back to 135.  Potassium dropped from 4.3 to 3 and  back up to 3.6.  Preop UA negative.  Blood group type 0 positive.  Urine  culture no growth.  EKG dated September 17, 2005 normal sinus rhythm, nonspecific  T-wave abnormalities, prolonged QT, rate slower since last tracing confirmed  by Dr. Donia Guiles.   HOSPITAL COURSE:  Admitted to [facility] and tolerated the  procedure well and sent to recovery room and orthopedic floor.  Started on  PCA and p.o. analgesics for pain control following surgery.  Did have some  nausea and vomiting along with itching on the evening of surgery and felt to  be due to the Duramorph which was in the spinal.  Symptoms were treated and  increased her regular diet.  Doing a little bit better on the morning of day  1.  Started getting up out of bed.  Hemovac drain was pulled.  By day 2 she  was doing better.  Nausea and vomiting had resolved and itching had  improved.  Started getting in with physical therapy and ambulating 150 feet  already that morning and over 200 feet that afternoon.  She did have a drop  in her sodium and potassium.  Fluids were discontinued.  She was placed  on a  little bit of potassium supplement and responded well.  Potassium came back  up.  Sodium improved.  Doing well on day 3.  She did have a little bit of  itching and burning, which she felt like she was getting a urinary tract  infection.  The UA had been checked, which was negative.  However, due to  history of UTIs we are going to put her on some Cipro and also one dose of  Diflucan for history of infections and felt like yeast infection was coming  on.  She did well with therapy an was discharged home that day.   DISCHARGE PLAN:  The patient was discharged home on September 20, 2005.   DISCHARGE DIAGNOSES:  Please see above.   DISCHARGE MEDICATIONS:  Percocet, Robaxin and Coumadin.  Three days of Cipro  and 1 dose of Diflucan.   DIET:  As tolerated.   FOLLOW UP:  In 2 weeks.   ACTIVITY:  Weightbearing as tolerated.  Home health PT, home health nursing,  total knee protocol.   DISPOSITION:  Home.   CONDITION ON DISCHARGE:   Improved.      Alexzandrew L. Julien Girt, P.A.      Ollen Gross, M.D.  Electronically Signed    ALP/MEDQ  D:  11/01/2005  T:  11/01/2005  Job:  [id number]   cc:   Valetta Mole. Swords, MD  [address] [place]  [place] [address]   Jesse Sans. Wall, MD, FACC  [address]  [address]  [place]  [place] [address]

## 2010-08-18 NOTE — H&P (Signed)
NAMEMARSHELL, RIEGER                        ACCOUNT NO.:  [id number]   MEDICAL RECORD NO.:  [id number]                   PATIENT TYPE:  INP   LOCATION:  0465                                 FACILITY:  [facility]   PHYSICIAN:  Ollen Gross, M.D.                 DATE OF BIRTH:  1941-03-23   DATE OF ADMISSION:  08/25/2003  DATE OF DISCHARGE:                                HISTORY & PHYSICAL   CHIEF COMPLAINT:  Left knee pain.   HISTORY OF PRESENT ILLNESS:  Patient is a 70 year old female with left knee  pain who has been seen by Dr. Lequita Halt.  Ongoing for left knee end-stage  arthritis.  She has been treated conservatively in the past and also has  undergone injections.  She recently had a severe reaction to the Synvisc and  did not tolerate this very well at all.  Her x-rays in the office have shown  that she has progressed to bone-on-bone in the lateral patellofemoral  compartments and also significantly narrowed to the medial compartment with  large osteophytes.  She has undergone cortisone injections in the past,  which only relieved symptoms temporarily.  She is highly sensitive to the  Synvisc and is not an option to pursue that treatment.  She has reached the  point where she would like to have something done about it.  It is felt that  due to her significant pain and also findings on x-rays that she would be an  excellent candidate.  Risks and benefits discussed.  Patient is subsequently  admitted to the hospital.   ALLERGIES:  MORPHINE.   CURRENT MEDICATIONS:  Cosamin DS and also spironolactone.   PAST MEDICAL HISTORY:  Fluid retention.   PAST SURGICAL HISTORY:  1. Tonsillectomy.  2. Cholecystectomy.  3. Hysterectomy.  4. Foot surgery at [facility].  5. Knee scopes.  6. Bilateral hip bursectomies.   FAMILY HISTORY:  Lung cancer with father.   SOCIAL HISTORY:  Married.  Works as a Customer service manager.  Nonsmoker.  No  alcohol.  Has two children.  Husband and mother-in-law will  be assisting  with care after surgery.   REVIEW OF SYSTEMS:  GENERAL:  No fevers, chills, night sweats.  NEURO:  No  seizures, syncope, paralysis.  RESPIRATORY:  No shortness of breath,  productive cough, or hemoptysis.  CARDIOVASCULAR:  No chest pain, angina,  orthopnea.  GI:  No nausea, vomiting, diarrhea, constipation.  GU:  No  dysuria, hematuria, or discharge.  MUSCULOSKELETAL:  Pertinent for that of  the left knee found in the history of present illness.   PHYSICAL EXAMINATION:  VITAL SIGNS:  Pulse 84, respirations 12, blood  pressure 120/70.  GENERAL:  A 70 year old white female who is well-developed and well-  nourished in no acute distress.  She is alert, oriented and cooperative.  Very pleasant at the time of exam.  Accompanied by her husband.  HEENT:  Normocephalic and atraumatic.  Pupils are round and reactive.  Oropharynx clear.  EOMs intact.  NECK:  Supple.  No carotid bruits.  CHEST:  Clear anterior and posterior chest wall.  No rales, rhonchi or  wheezes.  HEART:  Regular rate and rhythm.  No murmurs.  ABDOMEN:  Soft, nontender.  Bowel sounds present.  RECTAL/BREASTS/GENITALIA:  Not done.  Not pertinent to the present illness.  EXTREMITIES:  Significant for that of the left knee:  She has only range of  motion of 5-95 degrees.  There is no instability; however, there is marked  crepitus on passive range of motion.  She is extremely tender lateral, more  so than medial, and also in the proximal tibia.  No effusion.   IMPRESSION:  1. Osteoarthritis of the left knee.  2. History of fluid retention.   PLAN:  Patient will be admitted to [facility] to undergo a left  total knee arthroplasty.  Surgery will be performed by Dr. Ollen Gross.     Alexzandrew L. Julien Girt, P.A.              Ollen Gross, M.D.    ALP/MEDQ  D:  08/26/2003  T:  08/26/2003  Job:  [id number]   cc:   Valetta Mole. Swords, M.D. [facility]   Maisie Fus C. Wall, M.D.

## 2010-08-18 NOTE — Op Note (Signed)
NAME:  Brittney Fox, Brittney Fox                        ACCOUNT NO.:  [id number]   MEDICAL RECORD NO.:  [id number]                   PATIENT TYPE:  INP   LOCATION:  0007                                 FACILITY:  [facility]   PHYSICIAN:  Ollen Gross, M.D.                 DATE OF BIRTH:  1940-12-16   DATE OF PROCEDURE:  08/25/2003  DATE OF DISCHARGE:                                 OPERATIVE REPORT   PREOPERATIVE DIAGNOSIS:  Osteoarthritis of the left knee.   POSTOPERATIVE DIAGNOSES:  Osteoarthritis of the left knee.   OPERATION/PROCEDURE:  Left total knee arthroplasty.   SURGEON:  Ollen Gross, M.D.   ASSISTANT:  Alexzandrew L. Perkins, P.A.-C.   ANESTHESIA:  General with postoperative pain pump.   ESTIMATED BLOOD LOSS:  Minimal.   DRAINS:  Hemovac x1.   COMPLICATIONS:  None.   TOURNIQUET TIME:  43 minutes at 300 mmHg.   DISPOSITION:  Stable to recovery.   BRIEF CLINICAL NOTE:  Brittney Fox is a 70 year old female who has severe end-stage  osteoarthritis of the left knee with pain with rapidly progressive symptoms.  She has failed nonoperative management including injections and presents now  for total knee arthroplasty.   DESCRIPTION OF PROCEDURE:  After the successful administration of general  anesthesia, tourniquet was placed on the left thigh and left lower extremity  prepped and draped in the usual sterile fashion. The extremity was wrapped  in Esmarch, knee flexed, tourniquet inflated to 300 mmHg.   Standard midline incision was made with a 10-blade through the subcutaneous  tissue to the level of the extensor mechanism.  A fresh blade was used to  make a medial parapatellar arthrotomy and the soft tissue over proximal  medial tibia subperiosteally elevated to the joint line with the knife into  the semimembranosus bursa with a curved osteotome.  Soft tissue was over the  proximal lateral tibia was also elevated with attention being paid to  avoiding the patellar tendon on the  tibial tubercle.  Patella was subluxed,  knee flexed 90 degrees and the ACL and PCL removed.  Drill was used to  create a starting hole in the distal femur.  The canal was irrigated.  A 5-  degree left valgus alignment guide was placed.  Referencing off the  posterior condyles, rotation was marked and the block pins were removed 10  mm off the distal femur.  Distal femoral resection was made with an  oscillating saw.  Sizing block was then  placed and size 3 was the most  appropriate.  We did our rotational reference off the epicondylar axis.  The  size 3 cutting block was placed and the anterior and posterior cuts made.   Tibia was subluxed anteriorly and the menisci were removed.  Extramedullary  tibial alignment guide placed referencing proximally at the medial aspect of  the tibial tubercle and distally along the second metatarsal axis and tibial  crest.  The block was pinned to remove approximately 8 mm off the less  deficient medial side.  Tibial resection was made with an oscillating saw.  A size 3 was most appropriate tibial.  Then the proximal tibia was prepared  with a modular drill and keel punch for a size 3.  These femoral  preparations were then completed with the intercondylar and chamfer cuts.   The size 3 mobile-bearing tibial trial, size 3 posterior stabilized femoral  trial  and a 10 mm rotating platform insert trial were placed.  With the 10,  full extension was achieved with excellent varus and valgus balance  throughout full range of motion.  The patella was then everted and the  thickness measured to be 20 mm, free hand resection taken at 12 mm, 35  template placed, lug holes drilled, trial patella placed, and it tracks  normally.  The osteophytes were removed off the posterior femur with the  trials in place.  All trials were removed and the cut bone surfaces were  then prepared with pulsatile lavage.  Cement was mixed and once ready for  implantation, the size 3  mobile bearing tibial tray, size 3 posterior  stabilized femur and 35 patella were cemented into place and patella held  with a clamp.  The 10 mm trial insert was placed and the knee held in full  extension and all extruded cement removed.  Once the cement had fully  hardened and the permanent 10 mm posterior stabilized rotating platform  insert was placed into the tibial tray.  The wounds were then copiously  irrigated with antibiotic solution and the extensor mechanism closed over a  Hemovac drain with interrupted #1 PDS.  The tourniquet released after a  total time of 43 minutes.  Flexion against gravity is 135 degrees.  Subcutaneous tissues were then closed with interrupted 2-0 Vicryl,  subcuticular running 4-0 Monocryl.  The catheter for the Marcaine pain pump  was placed and the pump initiated.  Steri-Strips and a bulky sterile  dressing were then applied.  The Hemovac was hooked to suction and she was  then placed into a knee immobilizer, awakened and transported to recovery in  stable condition.                                               Ollen Gross, M.D.    FA/MEDQ  D:  08/25/2003  T:  08/25/2003  Job:  [id number]

## 2010-08-18 NOTE — Assessment & Plan Note (Signed)
[facility]                                   ON-CALL NOTE   Brittney Fox, Brittney Fox              MRN:          [id number]  DATE:01/19/2006                            DOB:          01/12/1941    PRIMARY CARE:  Dr. Cato Mulligan.   PHONE NUMBER:  [phone]   SUBJECTIVE:  Possible bladder infection with abdominal pain and discolored  urine.   ASSESSMENT AND PLAN:  The patient told to come to [facility].       [name], MD      AB/MedQ  DD:  01/20/2006  DT:  01/21/2006  Job #:  [id number]   cc:   Valetta Mole. Swords, MD

## 2010-08-18 NOTE — Consult Note (Signed)
Brittney Fox, Brittney Fox                        ACCOUNT NO.:  [id number]   MEDICAL RECORD NO.:  [id number]                   PATIENT TYPE:  INP   LOCATION:  0370                                 FACILITY:  [facility]   PHYSICIAN:  Vida Roller, M.D.                DATE OF BIRTH:  1940-07-20   DATE OF CONSULTATION:  08/25/2003  DATE OF DISCHARGE:                                   CONSULTATION   CHIEF COMPLAINT:  Premature ventricular contractions.   HISTORY OF PRESENT ILLNESS:  This is a 70 year old female followed in our  practice by Dr. Valera Castle.  She had a negative cardiac workup in  approximately 2000; the records are pending at time of this dictation.  Today the patient underwent left total knee replacement performed by Dr.  Lequita Halt.  Postoperatively the patient did develop some bigeminy in the PACU.  She did receive a 100 mg bolus of IV lidocaine.  She is now in sinus rhythm  with a rate in the 70 range on telemetry.  The patient has no recollection  of the event.  She denies any chest pain or shortness of breath.  She states  she has always been very active, works, and exercises on a regular basis  without symptoms.   PAST MEDICAL HISTORY:  Significant for osteoarthritis and irritable bowel  syndrome.  She is status post cholecystectomy, hysterectomy, and previous  foot surgery.  She has a history of mildly-elevated cholesterol.  She had a  recent urinary tract infection.  She denies history of hypertension or  diabetes mellitus.  She believes she had a stress test as well as an  echocardiogram in 2000.  We are trying to obtain those records at this time.   ALLERGIES:  1. MORPHINE.  2. PENICILLIN.  3. SYNVISC.   MEDICATIONS PRIOR TO ADMISSION:  1. Spironolactone/hydrochlorothiazide 25/25.  2. Cosamin DS.  3. Cipro which was recently completed.   SOCIAL HISTORY:  The patient lives in [place].  She works for [employer].  She is married.  She has two sons  of her own and a son  in her second marriage.  She uses alcohol occasionally.  She has a remote  tobacco history but she never smoked heavily.  She quit 30 years ago.   FAMILY HISTORY:  Her mother is alive at age 21.  She has hypertension and  elevated cholesterol.  Her father died at age 50 from lung CA.   REVIEW OF SYSTEMS:  Complete review of systems was negative except for the  following:  She has had recent fever, chills, and sweats related to her  urinary tract infection. She has had recent intentional weight loss.  She  noticed a rash on her arm several days ago which she believes was poison  ivy; it has now since resolved.  She has mild cold intolerance.  She has  noted some  recent palpitations.  The remainder of review of systems was  totally negative.   PHYSICAL EXAMINATION:  GENERAL:  Reveals a pleasant but drowsy 70 year old  white female in no acute distress.  VITAL SIGNS:  Blood pressure 116/64, pulse 74.  HEENT:  Unremarkable.  NECK:  Reveals no bruits, no jugular venous distention.  HEART:  Reveals regular rate and rhythm without murmur.  LUNGS:  Clear anteriorly.  ABDOMEN:  Soft, nontender.  EXTREMITIES:  Reveal pulses to be intact in the right lower extremity.  She  has intermittent compression hose on both extremities.  Her left lower  extremity is in an active range of motion device.  SKIN:  Cool and dry.   LABORATORY DATA:  A chest x-ray shows no active disease.  An EKG performed  July 19, 2003 shows sinus rhythm, rate 96 per minute, with small Q's in I  and aVL as well as nonspecific changes and premature supraventricular  complexes.  An EKG today shows sinus rhythm with premature supraventricular  complexes, rate 72 beats per minute.  There is an incomplete right bundle-  branch block with nonspecific changes and a mildly prolonged QT interval  corrected at 494 milliseconds.  A chemistry profile on the 18th revealed a  BUN 17, creatinine 0.7, potassium 3.7,  glucose 94.  A CBC on May 19 was  within normal limits.  An INR was 0.8, PTT 30.   IMPRESSION:  1. Ventricular bigeminy, resolved following lidocaine bolus.  2. Postoperative left total knee replacement today secondary to     osteoarthritis.  3. Status post multiple surgeries as noted above.  4. Recent urinary tract infection.  5. History of irritable bowel syndrome.  6. History of mildly elevated cholesterol.  7. Questionable hypertension.   PLAN:  The patient was seen by myself and Dr. Vida Roller today.  At this  time we will plan on keeping the patient on telemetry.  We will cycle  cardiac enzymes x3.  We will check a fasting lipid profile in the morning.  We will obtain office records regarding her previous cardiac workup.  We  will plan on a repeat echocardiogram.  We will hold her diuretic therapy for  now.  We will consider starting a beta blocker in the morning.  If her  echocardiogram is abnormal she will need a cardiac catheterization; if it is  normal we plan on an outpatient Cardiolite.     Delton See, P.A. [facility]                  Vida Roller, M.D.    DR/MEDQ  D:  08/25/2003  T:  08/25/2003  Job:  [id number]   cc:   Valetta Mole. Swords, M.D. [facility]   Ollen Gross, M.D.  [facility]  [address]  [address]  [place]  [place] [address]  Fax: [phone]

## 2010-08-24 ENCOUNTER — Other Ambulatory Visit: Payer: Self-pay | Admitting: Internal Medicine

## 2010-09-23 ENCOUNTER — Other Ambulatory Visit: Payer: Self-pay | Admitting: Internal Medicine

## 2010-11-24 ENCOUNTER — Other Ambulatory Visit: Payer: Self-pay | Admitting: Internal Medicine

## 2010-11-24 DIAGNOSIS — Z1231 Encounter for screening mammogram for malignant neoplasm of breast: Secondary | ICD-10-CM

## 2010-11-27 ENCOUNTER — Ambulatory Visit
Admission: RE | Admit: 2010-11-27 | Discharge: 2010-11-27 | Disposition: A | Discharge status: Home / Self Care | Payer: Medicare Other | Source: Ambulatory Visit | Attending: Internal Medicine | Admitting: Internal Medicine

## 2010-11-27 DIAGNOSIS — Z1231 Encounter for screening mammogram for malignant neoplasm of breast: Secondary | ICD-10-CM

## 2010-11-28 ENCOUNTER — Ambulatory Visit (INDEPENDENT_AMBULATORY_CARE_PROVIDER_SITE_OTHER): Payer: Medicare Other | Admitting: Internal Medicine

## 2010-11-28 ENCOUNTER — Encounter: Payer: Self-pay | Admitting: Internal Medicine

## 2010-11-28 ENCOUNTER — Ambulatory Visit: Payer: Self-pay | Admitting: Internal Medicine

## 2010-11-28 DIAGNOSIS — I7300 Raynaud's syndrome without gangrene: Secondary | ICD-10-CM

## 2010-11-28 DIAGNOSIS — K589 Irritable bowel syndrome without diarrhea: Secondary | ICD-10-CM

## 2010-11-28 DIAGNOSIS — E785 Hyperlipidemia, unspecified: Secondary | ICD-10-CM

## 2010-11-28 DIAGNOSIS — M1990 Unspecified osteoarthritis, unspecified site: Secondary | ICD-10-CM

## 2010-11-28 LAB — BASIC METABOLIC PANEL
BUN: 13 mg/dL (ref 6–23)
Calcium: 9.4 mg/dL (ref 8.4–10.5)
Chloride: 98 mEq/L (ref 96–112)
Creatinine, Ser: 0.6 mg/dL (ref 0.4–1.2)
GFR: 107.28 mL/min (ref >60.00)

## 2010-11-28 LAB — CBC WITH DIFFERENTIAL/PLATELET
Basophils Absolute: 0 10*3/uL (ref 0.0–0.1)
Eosinophils Absolute: 0.1 10*3/uL (ref 0.0–0.7)
HCT: 48.3 % — ABNORMAL HIGH (ref 36.0–46.0)
Hemoglobin: 16.2 g/dL — ABNORMAL HIGH (ref 12.0–15.0)
Lymphs Abs: 1.8 10*3/uL (ref 0.7–4.0)
MCHC: 33.5 g/dL (ref 30.0–36.0)
Monocytes Relative: 5.7 % (ref 3.0–12.0)
Neutro Abs: 5.5 10*3/uL (ref 1.4–7.7)
Platelets: 246 10*3/uL (ref 150.0–400.0)
RDW: 14.3 % (ref 11.5–14.6)

## 2010-11-28 LAB — LDL CHOLESTEROL, DIRECT: Direct LDL: 150.2 mg/dL

## 2010-11-28 LAB — HEPATIC FUNCTION PANEL
ALT: 22 U/L (ref 0–35)
AST: 20 U/L (ref 0–37)
Albumin: 4.4 g/dL (ref 3.5–5.2)
Alkaline Phosphatase: 68 U/L (ref 39–117)
Bilirubin, Direct: 0.2 mg/dL (ref 0.0–0.3)
Total Protein: 7.5 g/dL (ref 6.0–8.3)

## 2010-11-28 LAB — TSH: TSH: 1.05 u[IU]/mL (ref 0.35–5.50)

## 2010-11-28 LAB — LIPID PANEL: Cholesterol: 207 mg/dL — ABNORMAL HIGH (ref 0–200)

## 2010-11-28 NOTE — Progress Notes (Signed)
  Subjective:    Patient ID: Brittney Fox, female    DOB: Dec 20, 1940, 70 y.o.   MRN: [id number]  HPI Breathing much better with symbicort--she does note that she has developed a raspy voice  Lipids--tolerating meds  GI sxs---a lot of GERD and a lot of "gas"  Past Medical History  Diagnosis Date  . Hyperlipidemia   . Asthma   . Rosacea     opitcal  . Other premature beats   . Diverticulosis of colon (without mention of hemorrhage)   . Irritable bowel syndrome   . Other mucopurulent conjunctivitis   . Dysphagia, unspecified   . Raynaud's syndrome    Past Surgical History  Procedure Date  . Abdominal hysterectomy 1990  . Replacement total knee   . Lens implant   . Cholecystectomy   . Dental implants   . Cataract extraction     bilateral  . Foot surgery     reports that she has quit smoking. She does not have any smokeless tobacco history on file. She reports that she drinks alcohol. She reports that she does not use illicit drugs. family history includes Cancer in her maternal grandmother; Diabetes in her paternal uncle; and Heart disease in her father. Allergies  Allergen Reactions  . Codeine Phosphate     REACTION: headache  . Morphine Sulfate     REACTION: tongue swelling    Review of Systems      patient denies chest pain, shortness of breath, orthopnea. Denies lower extremity edema, abdominal pain, change in appetite, change in bowel movements. Patient denies rashes, musculoskeletal complaints. No other specific complaints in a complete review of systems.  Objective:   Physical Exam  Well-developed well-nourished female in no acute distress. HEENT exam atraumatic, normocephalic, extraocular muscles are intact. Neck is supple. No jugular venous distention no thyromegaly. Chest clear to auscultation without increased work of breathing. Cardiac exam S1 and S2 are regular. Abdominal exam active bowel sounds, soft, nontender. Extremities no edema. Neurologic exam she is  alert without any motor sensory deficits. Gait is normal.        Assessment & Plan:

## 2010-11-28 NOTE — Assessment & Plan Note (Signed)
Has had knee replacements

## 2010-11-28 NOTE — Assessment & Plan Note (Signed)
No meds Will recheck labs 

## 2010-11-28 NOTE — Assessment & Plan Note (Signed)
No real sxs.  No need for treatment

## 2010-11-30 ENCOUNTER — Telehealth: Payer: Self-pay | Admitting: Internal Medicine

## 2010-11-30 NOTE — Telephone Encounter (Signed)
Pt would like clarification on her lab results. Please contact.

## 2010-12-01 NOTE — Telephone Encounter (Signed)
Pt aware of lab results 

## 2010-12-26 ENCOUNTER — Other Ambulatory Visit: Payer: Self-pay | Admitting: *Deleted

## 2010-12-26 MED ORDER — POTASSIUM CHLORIDE 25 MEQ PO PACK: 1.0000 | PACK | Freq: Every day | ORAL | Status: DC

## 2011-01-04 ENCOUNTER — Ambulatory Visit: Payer: Self-pay | Admitting: Internal Medicine

## 2011-01-22 ENCOUNTER — Telehealth: Payer: Self-pay | Admitting: *Deleted

## 2011-01-22 NOTE — Telephone Encounter (Signed)
Repeat bmet.  No need for cxr---had a normal cxr 10 months ago

## 2011-01-22 NOTE — Telephone Encounter (Signed)
Request f/u labs to check Potassium levels & request chest Xray due to family history.

## 2011-02-09 ENCOUNTER — Ambulatory Visit (INDEPENDENT_AMBULATORY_CARE_PROVIDER_SITE_OTHER): Payer: Medicare Other | Admitting: *Deleted

## 2011-02-09 DIAGNOSIS — E875 Hyperkalemia: Secondary | ICD-10-CM

## 2011-02-09 DIAGNOSIS — Z23 Encounter for immunization: Secondary | ICD-10-CM

## 2011-02-09 LAB — BASIC METABOLIC PANEL WITH GFR
BUN: 15 mg/dL (ref 6–23)
CO2: 29 meq/L (ref 19–32)
Calcium: 9.2 mg/dL (ref 8.4–10.5)
Chloride: 96 meq/L (ref 96–112)
Creatinine, Ser: 0.6 mg/dL (ref 0.4–1.2)
GFR: 107.22 mL/min
Glucose, Bld: 116 mg/dL — ABNORMAL HIGH (ref 70–99)
Potassium: 3.4 meq/L — ABNORMAL LOW (ref 3.5–5.1)
Sodium: 135 meq/L (ref 135–145)

## 2011-02-16 ENCOUNTER — Telehealth: Payer: Self-pay | Admitting: *Deleted

## 2011-02-16 NOTE — Telephone Encounter (Signed)
Pt wants to know if Dr Cato Mulligan wanted her to have a CXR.  Pt's sister and father have a hx of lung cancer.  Her last CXR was 6 years ago for a pre-op visit

## 2011-02-26 ENCOUNTER — Encounter: Payer: Self-pay | Admitting: Internal Medicine

## 2011-02-26 ENCOUNTER — Ambulatory Visit (INDEPENDENT_AMBULATORY_CARE_PROVIDER_SITE_OTHER): Payer: Medicare Other | Admitting: Internal Medicine

## 2011-02-26 VITALS — BP 110/82 | Temp 98.2°F | Wt 181.0 lb

## 2011-02-26 DIAGNOSIS — J329 Chronic sinusitis, unspecified: Secondary | ICD-10-CM

## 2011-02-26 MED ORDER — DOXYCYCLINE HYCLATE 100 MG PO TABS: 100.0000 mg | ORAL_TABLET | Freq: Two times a day (BID) | ORAL | Status: AC

## 2011-02-26 NOTE — Progress Notes (Signed)
  Subjective:    Patient ID: Brittney Fox, female    DOB: 1941-01-11, 70 y.o.   MRN: [id number]  HPI  70 year old patient who is seen today due to acute illness. She has a history of osteoarthritis Raynaud's syndrome. For the past 10 days she has had head congestion. She describes sinus pressure and headache. She has developed cough which has been productive of yellow sputum there has been no documented fever she is seen today due to worsening sinus congestion pressure headache and now yellow secretions from her sinuses. She has tried a number of over-the-counter medications including Mucinex and Tylenol.    Review of Systems  Constitutional: Positive for fatigue. Negative for fever.  HENT: Positive for congestion and sinus pressure. Negative for hearing loss, sore throat, rhinorrhea, dental problem and tinnitus.   Eyes: Negative for pain, discharge and visual disturbance.  Respiratory: Positive for cough. Negative for shortness of breath.   Cardiovascular: Negative for chest pain, palpitations and leg swelling.  Gastrointestinal: Negative for nausea, vomiting, abdominal pain, diarrhea, constipation, blood in stool and abdominal distention.  Genitourinary: Negative for dysuria, urgency, frequency, hematuria, flank pain, vaginal bleeding, vaginal discharge, difficulty urinating, vaginal pain and pelvic pain.  Musculoskeletal: Negative for joint swelling, arthralgias and gait problem.  Skin: Negative for rash.  Neurological: Positive for headaches. Negative for dizziness, syncope, speech difficulty, weakness and numbness.  Hematological: Negative for adenopathy.  Psychiatric/Behavioral: Negative for behavioral problems, dysphoric mood and agitation. The patient is not nervous/anxious.        Objective:   Physical Exam  Constitutional: She is oriented to person, place, and time. She appears well-developed and well-nourished.  HENT:  Head: Normocephalic.  Right Ear: External ear normal.    Left Ear: External ear normal.  Mouth/Throat: Oropharynx is clear and moist.  Eyes: Conjunctivae and EOM are normal. Pupils are equal, round, and reactive to light.       Mild maxillary sinus tenderness  Neck: Normal range of motion. Neck supple. No thyromegaly present.  Cardiovascular: Normal rate, regular rhythm, normal heart sounds and intact distal pulses.   Pulmonary/Chest: Effort normal and breath sounds normal.  Abdominal: Soft. Bowel sounds are normal. She exhibits no mass. There is no tenderness.  Musculoskeletal: Normal range of motion.  Lymphadenopathy:    She has no cervical adenopathy.  Neurological: She is alert and oriented to person, place, and time.  Skin: Skin is warm and dry. No rash noted.  Psychiatric: She has a normal mood and affect. Her behavior is normal.          Assessment & Plan:   URI with worsening symptoms after 10 days of illness. Will treat with doxycycline for possible acute sinusitis. Will continue expectorants decongestants. We'll call if unimproved

## 2011-02-26 NOTE — Patient Instructions (Signed)
Get plenty of rest, Drink lots of  clear liquids, and use Tylenol or ibuprofen for fever and discomfort.    Take your antibiotic as prescribed until ALL of it is gone, but stop if you develop a rash, swelling, or any side effects of the medication.  Contact our office as soon as possible if  there are side effects of the medication.  Call or return to clinic prn if these symptoms worsen or fail to improve as anticipated.   

## 2011-03-29 ENCOUNTER — Other Ambulatory Visit: Payer: Self-pay | Admitting: Internal Medicine

## 2011-04-04 DIAGNOSIS — S62639A Displaced fracture of distal phalanx of unspecified finger, initial encounter for closed fracture: Secondary | ICD-10-CM | POA: Diagnosis not present

## 2011-04-05 DIAGNOSIS — M48061 Spinal stenosis, lumbar region without neurogenic claudication: Secondary | ICD-10-CM | POA: Diagnosis not present

## 2011-04-05 DIAGNOSIS — M5137 Other intervertebral disc degeneration, lumbosacral region: Secondary | ICD-10-CM | POA: Diagnosis not present

## 2011-04-05 DIAGNOSIS — C499 Malignant neoplasm of connective and soft tissue, unspecified: Secondary | ICD-10-CM | POA: Diagnosis not present

## 2011-04-05 DIAGNOSIS — M4310 Spondylolisthesis, site unspecified: Secondary | ICD-10-CM | POA: Diagnosis not present

## 2011-04-05 DIAGNOSIS — M47817 Spondylosis without myelopathy or radiculopathy, lumbosacral region: Secondary | ICD-10-CM | POA: Diagnosis not present

## 2011-04-09 DIAGNOSIS — M48062 Spinal stenosis, lumbar region with neurogenic claudication: Secondary | ICD-10-CM | POA: Diagnosis not present

## 2011-04-12 DIAGNOSIS — L84 Corns and callosities: Secondary | ICD-10-CM | POA: Diagnosis not present

## 2011-04-12 DIAGNOSIS — M48062 Spinal stenosis, lumbar region with neurogenic claudication: Secondary | ICD-10-CM | POA: Diagnosis not present

## 2011-04-12 DIAGNOSIS — M779 Enthesopathy, unspecified: Secondary | ICD-10-CM | POA: Diagnosis not present

## 2011-04-16 DIAGNOSIS — M19049 Primary osteoarthritis, unspecified hand: Secondary | ICD-10-CM | POA: Diagnosis not present

## 2011-04-17 DIAGNOSIS — M48062 Spinal stenosis, lumbar region with neurogenic claudication: Secondary | ICD-10-CM | POA: Diagnosis not present

## 2011-04-26 DIAGNOSIS — M48062 Spinal stenosis, lumbar region with neurogenic claudication: Secondary | ICD-10-CM | POA: Diagnosis not present

## 2011-05-01 DIAGNOSIS — M48062 Spinal stenosis, lumbar region with neurogenic claudication: Secondary | ICD-10-CM | POA: Diagnosis not present

## 2011-05-08 DIAGNOSIS — M48062 Spinal stenosis, lumbar region with neurogenic claudication: Secondary | ICD-10-CM | POA: Diagnosis not present

## 2011-05-14 DIAGNOSIS — M48062 Spinal stenosis, lumbar region with neurogenic claudication: Secondary | ICD-10-CM | POA: Diagnosis not present

## 2011-05-17 DIAGNOSIS — M48062 Spinal stenosis, lumbar region with neurogenic claudication: Secondary | ICD-10-CM | POA: Diagnosis not present

## 2011-05-21 DIAGNOSIS — M48062 Spinal stenosis, lumbar region with neurogenic claudication: Secondary | ICD-10-CM | POA: Diagnosis not present

## 2011-05-23 DIAGNOSIS — M48062 Spinal stenosis, lumbar region with neurogenic claudication: Secondary | ICD-10-CM | POA: Diagnosis not present

## 2011-07-04 DIAGNOSIS — M2040 Other hammer toe(s) (acquired), unspecified foot: Secondary | ICD-10-CM | POA: Diagnosis not present

## 2011-07-04 DIAGNOSIS — L84 Corns and callosities: Secondary | ICD-10-CM | POA: Diagnosis not present

## 2011-08-16 ENCOUNTER — Encounter: Payer: Self-pay | Admitting: *Deleted

## 2011-08-21 ENCOUNTER — Encounter: Payer: Self-pay | Admitting: Internal Medicine

## 2011-09-04 ENCOUNTER — Ambulatory Visit (INDEPENDENT_AMBULATORY_CARE_PROVIDER_SITE_OTHER): Payer: Medicare Other | Admitting: Internal Medicine

## 2011-09-04 ENCOUNTER — Encounter: Payer: Self-pay | Admitting: Internal Medicine

## 2011-09-04 VITALS — BP 118/78 | HR 80 | Temp 98.1°F | Wt 179.0 lb

## 2011-09-04 DIAGNOSIS — Z23 Encounter for immunization: Secondary | ICD-10-CM

## 2011-09-04 DIAGNOSIS — R739 Hyperglycemia, unspecified: Secondary | ICD-10-CM

## 2011-09-04 DIAGNOSIS — E785 Hyperlipidemia, unspecified: Secondary | ICD-10-CM | POA: Diagnosis not present

## 2011-09-04 DIAGNOSIS — I10 Essential (primary) hypertension: Secondary | ICD-10-CM

## 2011-09-04 DIAGNOSIS — R7309 Other abnormal glucose: Secondary | ICD-10-CM | POA: Diagnosis not present

## 2011-09-04 LAB — BASIC METABOLIC PANEL
Calcium: 9 mg/dL (ref 8.4–10.5)
GFR: 90.87 mL/min (ref >60.00)
Glucose, Bld: 114 mg/dL — ABNORMAL HIGH (ref 70–99)
Sodium: 138 mEq/L (ref 135–145)

## 2011-09-04 LAB — HEMOGLOBIN A1C: Hgb A1c MFr Bld: 7 % — ABNORMAL HIGH (ref 4.6–6.5)

## 2011-09-04 LAB — TSH: TSH: 1.42 u[IU]/mL (ref 0.35–5.50)

## 2011-09-04 LAB — LIPID PANEL
Total CHOL/HDL Ratio: 4
Triglycerides: 98 mg/dL (ref 0.0–149.0)

## 2011-09-04 NOTE — Progress Notes (Signed)
Patient ID: Brittney Fox, female   DOB: 02-09-1941, 71 y.o.   MRN: [id number] Hyperglycemia-- home measurements with blood sugars in the 120-180 range. She does not have any significant family history of diabetes.  Lipids- no meds  htn-- tolerating meds  Past Medical History  Diagnosis Date  . Hyperlipidemia   . Asthma   . Rosacea     opitcal  . Other premature beats   . Diverticulosis of colon (without mention of hemorrhage)   . Irritable bowel syndrome   . Other mucopurulent conjunctivitis   . Dysphagia, unspecified   . Raynaud's syndrome     History   Social History  . Marital Status: Married    Spouse Name: N/A    Number of Children: N/A  . Years of Education: N/A   Occupational History  . Not on file.   Social History Main Topics  . Smoking status: Former Games developer  . Smokeless tobacco: Not on file  . Alcohol Use: Yes  . Drug Use: No  . Sexually Active: Not on file   Other Topics Concern  . Not on file   Social History Narrative  . No narrative on file    Past Surgical History  Procedure Date  . Abdominal hysterectomy 1990  . Replacement total knee   . Lens implant   . Cholecystectomy   . Dental implants   . Cataract extraction     bilateral  . Foot surgery     Family History  Problem Relation Age of Onset  . Heart disease Father   . Diabetes Paternal Uncle   . Ovarian cancer Maternal Grandmother   . Lung cancer Sister     Allergies  Allergen Reactions  . Codeine Phosphate     REACTION: headache  . Morphine Sulfate     REACTION: tongue swelling    Current Outpatient Prescriptions on File Prior to Visit  Medication Sig Dispense Refill  . estrogens, conjugated, (PREMARIN) 0.3 MG tablet Take 0.3 mg by mouth daily.      Marland Kitchen spironolactone-hydrochlorothiazide (ALDACTAZIDE) 25-25 MG per tablet TAKE ONE TABLET EACH DAY  30 tablet  5     patient denies chest pain, shortness of breath, orthopnea. Denies lower extremity edema, abdominal pain,  change in appetite, change in bowel movements. Patient denies rashes, musculoskeletal complaints. No other specific complaints in a complete review of systems.   BP 118/78  Pulse 80  Temp(Src) 98.1 F (36.7 C) (Oral)  Wt 179 lb (81.194 kg)  Well-developed well-nourished female in no acute distress. HEENT exam atraumatic, normocephalic, extraocular muscles are intact. Neck is supple. No jugular venous distention no thyromegaly. Chest clear to auscultation without increased work of breathing. Cardiac exam S1 and S2 are regular. Abdominal exam active bowel sounds, soft, nontender. Extremities no edema.

## 2011-09-06 ENCOUNTER — Encounter: Payer: Self-pay | Admitting: Internal Medicine

## 2011-09-06 DIAGNOSIS — R739 Hyperglycemia, unspecified: Secondary | ICD-10-CM | POA: Insufficient documentation

## 2011-09-06 NOTE — Assessment & Plan Note (Signed)
She needs further evaluation. Will check laboratory work today. Discussed the need for aggressive weight loss, daily exercise.

## 2011-09-28 ENCOUNTER — Other Ambulatory Visit: Payer: Self-pay | Admitting: Internal Medicine

## 2011-09-28 DIAGNOSIS — E119 Type 2 diabetes mellitus without complications: Secondary | ICD-10-CM

## 2011-10-02 DIAGNOSIS — L84 Corns and callosities: Secondary | ICD-10-CM | POA: Diagnosis not present

## 2011-10-12 ENCOUNTER — Other Ambulatory Visit: Payer: Self-pay | Admitting: Internal Medicine

## 2011-10-12 DIAGNOSIS — R739 Hyperglycemia, unspecified: Secondary | ICD-10-CM

## 2011-10-17 ENCOUNTER — Other Ambulatory Visit: Payer: Self-pay | Admitting: Internal Medicine

## 2011-10-23 ENCOUNTER — Encounter: Payer: Medicare Other | Attending: Internal Medicine | Admitting: *Deleted

## 2011-10-23 ENCOUNTER — Encounter: Payer: Self-pay | Admitting: *Deleted

## 2011-10-23 VITALS — Ht 65.0 in | Wt 178.9 lb

## 2011-10-23 DIAGNOSIS — Z713 Dietary counseling and surveillance: Secondary | ICD-10-CM | POA: Insufficient documentation

## 2011-10-23 DIAGNOSIS — E119 Type 2 diabetes mellitus without complications: Secondary | ICD-10-CM | POA: Insufficient documentation

## 2011-10-23 DIAGNOSIS — R739 Hyperglycemia, unspecified: Secondary | ICD-10-CM

## 2011-10-23 NOTE — Patient Instructions (Addendum)
Plan: Aim for 2 Carb Choices (30 grams) per meal +/- 1 either way Aim for 0-1 Carb Choice per snack if hungry Continue with current activity plan of personal trainer twice a week and stationary bike 5 days a week Ask MD about Rx for BG Meter and strips so you can be aware of BG management

## 2011-10-23 NOTE — Progress Notes (Signed)
  Medical Nutrition Therapy:  Appt start time: 0915 end time:  1015.  Assessment:  Primary concerns today: patient here for diabetes education and assistance with weight loss. She is a Veterinary surgeon and works full time, is physically active working with a Psychologist, educational twice a week as well as gym exercises 5 days a week. She  Is newly diagnosed with diabetes and current A1c is 7.0%. No diabetes medications at this time. She is testing her BG twice daily using her husband's meter for now.  MEDICATIONS: see list  TANITA  BODY COMP RESULTS Weight 176.5 lb   %Fat 39.2 %   Fat Mass (lbs) 69.0 lb   Fat Free Mass (lbs) 107.5 lb   Total Body Water (lbs) 78.5 lb     DIETARY INTAKE:  Usual eating pattern includes 3 meals and 3 snacks per day.  Everyday foods include good variety of all food groups.  Avoided foods include high fat and high sugar foods.    24-hr recall:  B ( AM): greek yogurt, Great Grains Protein cereal, Kashi and granola sprinkled on top, coffee with Splenda and 1 tsp whipping cream  Snk ( AM): occasional- nuts or fresh fruit or 1/2 Protein Bar or small bag chips  L ( PM): [employer]: small salad with onions, tomato, chicken breast, bacon, cheese and no dressing, unsweetened tea Snk ( PM): fresh fruit D ( PM): eat out often: lean meat, vegetables, occasional starch, no bread, water Snk ( PM): Weight Watchers ice cream or other similar product Beverages: coffee, water, unsweetened tea  Usual physical activity: personal trainer twice a week, stationary bike several days a week (5 days typically)  Estimated energy needs: 1200 calories 135 g carbohydrates 90 g protein 33 g fat  Progress Towards Goal(s):  In progress.   Nutritional Diagnosis:  NB-1.1 Food and nutrition-related knowledge deficit As related to new diagnosis of diabetes.  As evidenced by A1c of 7.0%.    Intervention:  Nutrition counseling and diabetes education provided. Due to her active lifestyle and  already healthy eating habits, I weighed her on the Tanita Scale to determine body fat composition and to set baseline for future appointments.  Discussed basic physiology of diabetes, SMBG and rationale of checking BG at alternate times of day, A1c, Carb Counting and reading food labels, and benefits of increased activity.   Plan: Aim for 2 Carb Choices (30 grams) per meal +/- 1 either way Aim for 0-1 Carb Choice per snack if hungry Continue with current activity plan of personal trainer twice a week and stationary bike 5 days a week Ask MD about Rx for BG Meter and strips so you can use it for yourself and be aware of BG management  Handouts given during visit include: Living Well with Diabetes Carb Counting and Food Label handouts Meal Plan Card  Monitoring/Evaluation:  Dietary intake, exercise, reading food labels, and body weight in 4 week(s).

## 2011-11-01 DIAGNOSIS — Z79899 Other long term (current) drug therapy: Secondary | ICD-10-CM | POA: Diagnosis not present

## 2011-11-01 DIAGNOSIS — E559 Vitamin D deficiency, unspecified: Secondary | ICD-10-CM | POA: Diagnosis not present

## 2011-11-01 DIAGNOSIS — R252 Cramp and spasm: Secondary | ICD-10-CM | POA: Diagnosis not present

## 2011-11-01 DIAGNOSIS — R7309 Other abnormal glucose: Secondary | ICD-10-CM | POA: Diagnosis not present

## 2011-11-01 DIAGNOSIS — R609 Edema, unspecified: Secondary | ICD-10-CM | POA: Diagnosis not present

## 2011-11-20 ENCOUNTER — Ambulatory Visit: Payer: Medicare Other | Admitting: *Deleted

## 2011-12-19 DIAGNOSIS — Z124 Encounter for screening for malignant neoplasm of cervix: Secondary | ICD-10-CM | POA: Diagnosis not present

## 2011-12-19 DIAGNOSIS — Z01419 Encounter for gynecological examination (general) (routine) without abnormal findings: Secondary | ICD-10-CM | POA: Diagnosis not present

## 2011-12-19 DIAGNOSIS — Z0000 Encounter for general adult medical examination without abnormal findings: Secondary | ICD-10-CM | POA: Diagnosis not present

## 2011-12-21 ENCOUNTER — Other Ambulatory Visit: Payer: Self-pay | Admitting: Obstetrics & Gynecology

## 2011-12-21 DIAGNOSIS — Z1231 Encounter for screening mammogram for malignant neoplasm of breast: Secondary | ICD-10-CM

## 2012-01-01 ENCOUNTER — Other Ambulatory Visit: Payer: Self-pay | Admitting: Obstetrics & Gynecology

## 2012-01-01 ENCOUNTER — Ambulatory Visit
Admission: RE | Admit: 2012-01-01 | Discharge: 2012-01-01 | Disposition: A | Discharge status: Home / Self Care | Payer: Medicare Other | Source: Ambulatory Visit | Attending: Obstetrics & Gynecology | Admitting: Obstetrics & Gynecology

## 2012-01-01 DIAGNOSIS — R0602 Shortness of breath: Secondary | ICD-10-CM | POA: Diagnosis not present

## 2012-01-01 DIAGNOSIS — R05 Cough: Secondary | ICD-10-CM

## 2012-01-01 DIAGNOSIS — R059 Cough, unspecified: Secondary | ICD-10-CM

## 2012-01-07 ENCOUNTER — Ambulatory Visit
Admission: RE | Admit: 2012-01-07 | Discharge: 2012-01-07 | Disposition: A | Discharge status: Home / Self Care | Payer: Medicare Other | Source: Ambulatory Visit | Attending: Obstetrics & Gynecology | Admitting: Obstetrics & Gynecology

## 2012-01-07 DIAGNOSIS — Z1231 Encounter for screening mammogram for malignant neoplasm of breast: Secondary | ICD-10-CM | POA: Diagnosis not present

## 2012-02-18 ENCOUNTER — Telehealth: Payer: Self-pay | Admitting: Internal Medicine

## 2012-02-18 NOTE — Telephone Encounter (Signed)
[name], I just asked [name] to open up Thursday Dec5 in [facility] for me. If she says yes then we can start schduling people for that day. Thanx DB. I dug out some plants for you this weekend

## 2012-02-18 NOTE — Telephone Encounter (Signed)
Dr Juanda Chance, I assume you are wanting me to schedule a colonoscopy for this patient???? What time would you like procedure scheduled at the hospital on 03/10/12? Would 10:30ish work??

## 2012-02-18 NOTE — Telephone Encounter (Signed)
You already opened up lec on Dec 5th in the morning and it is full. So, do you want [place] to see if you can work the whole day on 03/06/12? If that is feasible, I assume you of course would rather do this patient's procedure at [facility] on the 5th.... Or is there a reason it had to be done on 03/10/12?

## 2012-02-18 NOTE — Telephone Encounter (Signed)
Please schedule entire day so we can make some progress.in the back ups.

## 2012-02-18 NOTE — Telephone Encounter (Signed)
On 03/06/2012

## 2012-02-18 NOTE — Telephone Encounter (Signed)
I have spoken to patient. She has been scheduled for a previsit on 02/25/12 and a colonoscopy on 03/06/12

## 2012-02-18 NOTE — Telephone Encounter (Signed)
OK. We will open the rest of 03/06/12. So do you want Brittney Fox on 03/06/12 or 03/10/12 like you originally told her?

## 2012-02-25 ENCOUNTER — Ambulatory Visit (AMBULATORY_SURGERY_CENTER): Payer: Medicare Other | Admitting: *Deleted

## 2012-02-25 VITALS — Ht 65.0 in | Wt 178.2 lb

## 2012-02-25 DIAGNOSIS — Z1211 Encounter for screening for malignant neoplasm of colon: Secondary | ICD-10-CM

## 2012-02-25 MED ORDER — MOVIPREP 100 G PO SOLR: ORAL | Status: DC

## 2012-02-26 ENCOUNTER — Ambulatory Visit (INDEPENDENT_AMBULATORY_CARE_PROVIDER_SITE_OTHER): Payer: Medicare Other | Admitting: Internal Medicine

## 2012-02-26 ENCOUNTER — Ambulatory Visit: Payer: Medicare Other | Admitting: Internal Medicine

## 2012-02-26 DIAGNOSIS — Z23 Encounter for immunization: Secondary | ICD-10-CM

## 2012-03-06 ENCOUNTER — Ambulatory Visit (AMBULATORY_SURGERY_CENTER): Payer: Medicare Other | Admitting: Internal Medicine

## 2012-03-06 ENCOUNTER — Encounter: Payer: Self-pay | Admitting: Internal Medicine

## 2012-03-06 VITALS — BP 125/72 | HR 64 | Temp 97.6°F | Resp 25 | Ht 65.0 in | Wt 178.0 lb

## 2012-03-06 DIAGNOSIS — Z1211 Encounter for screening for malignant neoplasm of colon: Secondary | ICD-10-CM | POA: Diagnosis not present

## 2012-03-06 MED ORDER — SODIUM CHLORIDE 0.9 % IV SOLN: 500.0000 mL | INTRAVENOUS | Status: DC

## 2012-03-06 NOTE — Op Note (Signed)
[facility] [address] [place], [address]   COLONOSCOPY PROCEDURE REPORT  PATIENT: Brittney Fox, Brittney Fox  MR#: [id number] BIRTHDATE: 1940-04-27 , 71  yrs. old GENDER: Female ENDOSCOPIST: Hart Carwin, MD REFERRED [id number] colonoscopy PROCEDURE DATE:  03/06/2012 PROCEDURE:   Colonoscopy, screening ASA CLASS:   Class II INDICATIONS:Average risk patient for colon cancer and colonoscopy in 2002 and 2006. MEDICATIONS: MAC sedation, administered by CRNA and propofol (Diprivan) 200mg  IV  DESCRIPTION OF PROCEDURE:   After the risks benefits and alternatives of the procedure were thoroughly explained, informed consent was obtained.  A digital rectal exam revealed no abnormalities of the rectum.   The LB CF-[id number]  endoscope was introduced through the anus and advanced to the cecum, which was identified by both the appendix and ileocecal valve. No adverse events experienced.   The quality of the prep was good, using MoviPrep  The instrument was then slowly withdrawn as the colon was fully examined.      COLON FINDINGS: Moderate diverticulosis was noted in the sigmoid colon.  Retroflexed views revealed no abnormalities. The time to cecum=  .  Withdrawal time=  .  The scope was withdrawn and the procedure completed. COMPLICATIONS: There were no complications.  ENDOSCOPIC IMPRESSION: Moderate diverticulosis was noted in the sigmoid colon  RECOMMENDATIONS: High fiber diet   eSigned:  Hart Carwin, MD 03/06/2012 12:52 PM   cc:

## 2012-03-06 NOTE — Progress Notes (Signed)
1255 a/ox3 pleased with mac report to [employer]

## 2012-03-06 NOTE — Progress Notes (Signed)
No complaints noted in the recovery room. Maw  Patient did not experience any of the following events: a burn prior to discharge; a fall within the facility; wrong site/side/patient/procedure/implant event; or a hospital transfer or hospital admission upon discharge from the facility. (G8907) Patient did not have preoperative order for IV antibiotic SSI prophylaxis. (G8918)  

## 2012-03-06 NOTE — Patient Instructions (Addendum)
Handouts were given to your care partner on diverticulosis and high fiber diet.  You may resume your current medications today.  Please call if any questions or concerns.    YOU HAD AN ENDOSCOPIC PROCEDURE TODAY AT THE [facility]: Refer to the procedure report that was given to you for any specific questions about what was found during the examination.  If the procedure report does not answer your questions, please call your gastroenterologist to clarify.  If you requested that your care partner not be given the details of your procedure findings, then the procedure report has been included in a sealed envelope for you to review at your convenience later.  YOU SHOULD EXPECT: Some feelings of bloating in the abdomen. Passage of more gas than usual.  Walking can help get rid of the air that was put into your GI tract during the procedure and reduce the bloating. If you had a lower endoscopy (such as a colonoscopy or flexible sigmoidoscopy) you may notice spotting of blood in your stool or on the toilet paper. If you underwent a bowel prep for your procedure, then you may not have a normal bowel movement for a few days.  DIET: Your first meal following the procedure should be a light meal and then it is ok to progress to your normal diet.  A half-sandwich or bowl of soup is an example of a good first meal.  Heavy or fried foods are harder to digest and may make you feel nauseous or bloated.  Likewise meals heavy in dairy and vegetables can cause extra gas to form and this can also increase the bloating.  Drink plenty of fluids but you should avoid alcoholic beverages for 24 hours.  ACTIVITY: Your care partner should take you home directly after the procedure.  You should plan to take it easy, moving slowly for the rest of the day.  You can resume normal activity the day after the procedure however you should NOT DRIVE or use heavy machinery for 24 hours (because of the sedation medicines used  during the test).    SYMPTOMS TO REPORT IMMEDIATELY: A gastroenterologist can be reached at any hour.  During normal business hours, 8:30 AM to 5:00 PM Monday through Friday, call [phone].  After hours and on weekends, please call the GI answering service at [phone] who will take a message and have the physician on call contact you.   Following lower endoscopy (colonoscopy or flexible sigmoidoscopy):  Excessive amounts of blood in the stool  Significant tenderness or worsening of abdominal pains  Swelling of the abdomen that is new, acute  Fever of 100F or higher    FOLLOW UP: If any biopsies were taken you will be contacted by phone or by letter within the next 1-3 weeks.  Call your gastroenterologist if you have not heard about the biopsies in 3 weeks.  Our staff will call the home number listed on your records the next business day following your procedure to check on you and address any questions or concerns that you may have at that time regarding the information given to you following your procedure. This is a courtesy call and so if there is no answer at the home number and we have not heard from you through the emergency physician on call, we will assume that you have returned to your regular daily activities without incident.  SIGNATURES/CONFIDENTIALITY: You and/or your care partner have signed paperwork which will be entered into your   electronic medical record.  These signatures attest to the fact that that the information above on your After Visit Summary has been reviewed and is understood.  Full responsibility of the confidentiality of this discharge information lies with you and/or your care-partner. 

## 2012-03-07 ENCOUNTER — Telehealth: Payer: Self-pay | Admitting: *Deleted

## 2012-03-07 NOTE — Telephone Encounter (Signed)
  Follow up Call-  Call back number 03/06/2012  Post procedure Call Back phone  # [phone] or [phone]  Permission to leave phone message Yes     Patient questions:  Do you have a fever, pain , or abdominal swelling? no Pain Score  0 *  Have you tolerated food without any problems? yes  Have you been able to return to your normal activities? yes  Do you have any questions about your discharge instructions: Diet   no Medications  no Follow up visit  no  Do you have questions or concerns about your Care? no  Actions: * If pain score is 4 or above: No action needed, pain <4.

## 2012-03-11 DIAGNOSIS — M7730 Calcaneal spur, unspecified foot: Secondary | ICD-10-CM | POA: Diagnosis not present

## 2012-03-11 DIAGNOSIS — Q828 Other specified congenital malformations of skin: Secondary | ICD-10-CM | POA: Diagnosis not present

## 2012-04-15 DIAGNOSIS — M76899 Other specified enthesopathies of unspecified lower limb, excluding foot: Secondary | ICD-10-CM | POA: Diagnosis not present

## 2012-04-15 DIAGNOSIS — IMO0002 Reserved for concepts with insufficient information to code with codable children: Secondary | ICD-10-CM | POA: Diagnosis not present

## 2012-04-15 DIAGNOSIS — M1710 Unilateral primary osteoarthritis, unspecified knee: Secondary | ICD-10-CM | POA: Diagnosis not present

## 2012-04-18 ENCOUNTER — Other Ambulatory Visit (INDEPENDENT_AMBULATORY_CARE_PROVIDER_SITE_OTHER): Payer: Medicare Other

## 2012-04-18 DIAGNOSIS — E785 Hyperlipidemia, unspecified: Secondary | ICD-10-CM | POA: Diagnosis not present

## 2012-04-18 LAB — HEPATIC FUNCTION PANEL
ALT: 18 U/L (ref 0–35)
AST: 20 U/L (ref 0–37)
Albumin: 4 g/dL (ref 3.5–5.2)
Alkaline Phosphatase: 65 U/L (ref 39–117)
Bilirubin, Direct: 0.1 mg/dL (ref 0.0–0.3)
Total Protein: 7 g/dL (ref 6.0–8.3)

## 2012-04-19 ENCOUNTER — Other Ambulatory Visit: Payer: Self-pay | Admitting: Internal Medicine

## 2012-04-22 ENCOUNTER — Ambulatory Visit (INDEPENDENT_AMBULATORY_CARE_PROVIDER_SITE_OTHER): Payer: Medicare Other | Admitting: Internal Medicine

## 2012-04-22 ENCOUNTER — Encounter: Payer: Self-pay | Admitting: Internal Medicine

## 2012-04-22 VITALS — BP 124/80 | Temp 97.0°F | Wt 179.0 lb

## 2012-04-22 DIAGNOSIS — I10 Essential (primary) hypertension: Secondary | ICD-10-CM

## 2012-04-22 DIAGNOSIS — E785 Hyperlipidemia, unspecified: Secondary | ICD-10-CM | POA: Diagnosis not present

## 2012-04-22 DIAGNOSIS — E559 Vitamin D deficiency, unspecified: Secondary | ICD-10-CM

## 2012-04-22 DIAGNOSIS — K219 Gastro-esophageal reflux disease without esophagitis: Secondary | ICD-10-CM

## 2012-04-22 DIAGNOSIS — R739 Hyperglycemia, unspecified: Secondary | ICD-10-CM

## 2012-04-22 DIAGNOSIS — R7309 Other abnormal glucose: Secondary | ICD-10-CM | POA: Diagnosis not present

## 2012-04-22 LAB — CBC WITH DIFFERENTIAL/PLATELET
Basophils Absolute: 0 10*3/uL (ref 0.0–0.1)
Eosinophils Absolute: 0.1 10*3/uL (ref 0.0–0.7)
Hemoglobin: 15.8 g/dL — ABNORMAL HIGH (ref 12.0–15.0)
Lymphocytes Relative: 24 % (ref 12.0–46.0)
MCHC: 33.2 g/dL (ref 30.0–36.0)
Neutro Abs: 6.4 10*3/uL (ref 1.4–7.7)
Neutrophils Relative %: 67.6 % (ref 43.0–77.0)
Platelets: 259 10*3/uL (ref 150.0–400.0)
RDW: 14.1 % (ref 11.5–14.6)

## 2012-04-22 LAB — BASIC METABOLIC PANEL
BUN: 17 mg/dL (ref 6–23)
CO2: 28 mEq/L (ref 19–32)
Calcium: 9.4 mg/dL (ref 8.4–10.5)
Creatinine, Ser: 0.6 mg/dL (ref 0.4–1.2)
Glucose, Bld: 103 mg/dL — ABNORMAL HIGH (ref 70–99)

## 2012-04-22 NOTE — Assessment & Plan Note (Signed)
Controlled No need for treatment at this time

## 2012-04-22 NOTE — Progress Notes (Signed)
Patient ID: Brittney Fox, female   DOB: 1940-12-24, 72 y.o.   MRN: [id number] Hyperglycemia- followed by endocrinology  Lipids- no medications  GERD-- no sxs  Reviewed pmh, psh, soc hx   patient denies chest pain, shortness of breath, orthopnea. Denies lower extremity edema, abdominal pain, change in appetite, change in bowel movements. Patient denies rashes, musculoskeletal complaints. No other specific complaints in a complete review of systems.    Well-developed well-nourished female in no acute distress. HEENT exam atraumatic, normocephalic, extraocular muscles are intact. Neck is supple. No jugular venous distention no thyromegaly. Chest clear to auscultation without increased work of breathing. Cardiac exam S1 and S2 are regular. Abdominal exam active bowel sounds, soft, nontender. Extremities no edema. Neurologic exam she is alert without any motor sensory deficits. Gait is normal.

## 2012-04-22 NOTE — Assessment & Plan Note (Signed)
Followed by endocrinology No need for eval at this office

## 2012-05-01 DIAGNOSIS — N3000 Acute cystitis without hematuria: Secondary | ICD-10-CM | POA: Diagnosis not present

## 2012-05-08 DIAGNOSIS — R7309 Other abnormal glucose: Secondary | ICD-10-CM | POA: Diagnosis not present

## 2012-05-20 DIAGNOSIS — M76899 Other specified enthesopathies of unspecified lower limb, excluding foot: Secondary | ICD-10-CM | POA: Diagnosis not present

## 2012-06-13 DIAGNOSIS — L84 Corns and callosities: Secondary | ICD-10-CM | POA: Diagnosis not present

## 2012-09-09 DIAGNOSIS — M79609 Pain in unspecified limb: Secondary | ICD-10-CM | POA: Diagnosis not present

## 2012-09-09 DIAGNOSIS — IMO0002 Reserved for concepts with insufficient information to code with codable children: Secondary | ICD-10-CM | POA: Diagnosis not present

## 2012-09-09 DIAGNOSIS — M48061 Spinal stenosis, lumbar region without neurogenic claudication: Secondary | ICD-10-CM | POA: Diagnosis not present

## 2012-09-09 DIAGNOSIS — M5137 Other intervertebral disc degeneration, lumbosacral region: Secondary | ICD-10-CM | POA: Diagnosis not present

## 2012-10-07 ENCOUNTER — Ambulatory Visit (INDEPENDENT_AMBULATORY_CARE_PROVIDER_SITE_OTHER): Payer: Medicare Other | Admitting: Internal Medicine

## 2012-10-07 ENCOUNTER — Encounter: Payer: Self-pay | Admitting: Internal Medicine

## 2012-10-07 VITALS — BP 150/90 | HR 75 | Temp 98.0°F | Resp 20 | Wt 183.0 lb

## 2012-10-07 DIAGNOSIS — E119 Type 2 diabetes mellitus without complications: Secondary | ICD-10-CM | POA: Diagnosis not present

## 2012-10-07 DIAGNOSIS — M1990 Unspecified osteoarthritis, unspecified site: Secondary | ICD-10-CM

## 2012-10-07 NOTE — Patient Instructions (Addendum)
Limit your sodium (Salt) intake    It is important that you exercise regularly, at least 20 minutes 3 to 4 times per week.  If you develop chest pain or shortness of breath seek  medical attention.  You need to lose weight.  Consider a lower calorie diet and regular exercise.   Please check your hemoglobin A1c every 3 months  Venous Stasis and Chronic Venous Insufficiency As people age, the veins located in their legs may weaken and stretch. When veins weaken and lose the ability to pump blood effectively, the condition is called chronic venous insufficiency (CVI) or venous stasis. Almost all veins return blood back to the heart. This happens by:  The force of the heart pumping fresh blood pushes blood back to the heart.  Blood flowing to the heart from the force of gravity. In the deep veins of the legs, blood has to fight gravity and flow upstream back to the heart. Here, the leg muscles contract to pump blood back toward the heart. Vein walls are elastic, and many veins have small valves that only allow blood to flow in one direction. When leg muscles contract, they push inward against the elastic vein walls. This squeezes blood upward, opens the valves, and moves blood toward the heart. When leg muscles relax, the vein wall also relaxes and the valves inside the vein close to prevent blood from flowing backward. This method of pumping blood out of the legs is called the venous pump. CAUSES  The venous pump works best while walking and leg muscles are contracting. But when a person sits or stands, blood pressure in leg veins can build. Deep veins are usually able to withstand short periods of inactivity, but long periods of inactivity (and increased pressure) can stretch, weaken, and damage vein walls. High blood pressure can also stretch and damage vein walls. The veins may no longer be able to pump blood back to the heart. Venous hypertension (high blood pressure inside veins) that lasts over  time is a primary cause of CVI. CVI can also be caused by:   Deep vein thrombosis, a condition where a thrombus (blood clot) blocks blood flow in a vein.  Phlebitis, an inflammation of a superficial vein that causes a blood clot to form. Other risk factors for CVI may include:   Heredity.  Obesity.  Pregnancy.  Sedentary lifestyle.  Smoking.  Jobs requiring long periods of standing or sitting in one place.  Age and gender:  Women in their 31's and 42's and men in their 40's are more prone to developing CVI. SYMPTOMS  Symptoms of CVI may include:   Varicose veins.  Ulceration or skin breakdown.  Lipodermatosclerosis, a condition that affects the skin just above the ankle, usually on the inside surface. Over time the skin becomes brown, smooth, tight and often painful. Those with this condition have a high risk of developing skin ulcers.  Reddened or discolored skin on the leg.  Swelling. DIAGNOSIS  Your caregiver can diagnose CVI after performing a careful medical history and physical examination. To confirm the diagnosis, the following tests may also be ordered:   Duplex ultrasound.  Plethysmography (tests blood flow).  Venograms (x-ray using a special dye). TREATMENT The goals of treatment for CVI are to restore a person to an active life and to minimize pain or disability. Typically, CVI does not pose a serious threat to life or limb, and with proper treatment most people with this condition can continue to lead active  lives. In most cases, mild CVI can be treated on an outpatient basis with simple procedures. Treatment methods include:   Elastic compression socks.  Sclerotherapy, a procedure involving an injection of a material that "dissolves" the damaged veins. Other veins in the network of blood vessels take over the function of the damaged veins.  Vein stripping (an older procedure less commonly used).  Laser Ablation surgery.  Valve repair. HOME CARE  INSTRUCTIONS   Elastic compression socks must be worn every day. They can help with symptoms and lower the chances of the problem getting worse, but they do not cure the problem.  Only take over-the-counter or prescription medicines for pain, discomfort, or fever as directed by your caregiver.  Your caregiver will review your other medications with you. SEEK MEDICAL CARE IF:   You are confused about how to take your medications.  There is redness, swelling, or increasing pain in the affected area.  There is a red streak or line that extends up or down from the affected area.  There is a breakdown or loss of skin in the affected area, even if the breakdown is small.  You develop an unexplained oral temperature above 102 F (38.9 C).  There is an injury to the affected area. SEEK IMMEDIATE MEDICAL CARE IF:   There is an injury and open wound to the affected area.  Pain is not adequately relieved with pain medication prescribed or becomes severe.  An oral temperature above 102 F (38.9 C) develops.  The foot/ankle below the affected area becomes suddenly numb or the area feels weak and hard to move. MAKE SURE YOU:   Understand these instructions.  Will watch your condition.  Will get help right away if you are not doing well or get worse. Document Released: 07/23/2006 Document Revised: 06/11/2011 Document Reviewed: 09/30/2006 [facility] Patient Information 2014 [place], [place].

## 2012-10-07 NOTE — Progress Notes (Signed)
Subjective:    Patient ID: Brittney Fox, female    DOB: 02-19-1941, 72 y.o.   MRN: [id number]  HPI  72 year old patient who has a history of type 2 diabetes. This is managed with low dose metformin therapy. Her last hemoglobin A1c was 7.0 about one year ago. She has a history of osteoarthritis and is status post bilateral knee replacement surgeries. She presents with a chief complaint of intermittent right ankle swelling present for the past 3 or 4 weeks. Swelling that worsens throughout the day but resolves the by morning when she first awakens. No pulmonary complaints or history of DVT.  Wt Readings from Last 3 Encounters:  10/07/12 183 lb (83.008 kg)  04/22/12 179 lb (81.194 kg)  03/06/12 178 lb (80.74 kg)    Past Medical History  Diagnosis Date  . Hyperlipidemia   . Asthma   . Rosacea     opitcal  . Other premature beats   . Diverticulosis of colon (without mention of hemorrhage)   . Irritable bowel syndrome   . Other mucopurulent conjunctivitis   . Dysphagia, unspecified(787.20)   . Raynaud's syndrome   . Diabetes mellitus     managed by diet and exercise  . Spinal stenosis     History   Social History  . Marital Status: Married    Spouse Name: N/A    Number of Children: N/A  . Years of Education: N/A   Occupational History  . Not on file.   Social History Main Topics  . Smoking status: Former Smoker    Quit date: 10/22/1961  . Smokeless tobacco: Never Used  . Alcohol Use: No     Comment: alcohol makes her swell  . Drug Use: No  . Sexually Active: Not on file   Other Topics Concern  . Not on file   Social History Narrative  . No narrative on file    Past Surgical History  Procedure Laterality Date  . Abdominal hysterectomy  1990  . Replacement total knee  2005, 2007    bilateral  . Lens implant    . Cholecystectomy    . Dental implants    . Cataract extraction      bilateral  . Foot surgery    . Hip surgery  1973    bilateral for bursitis     Family History  Problem Relation Age of Onset  . Heart disease Father   . Diabetes Paternal Uncle   . Ovarian cancer Maternal Grandmother   . Lung cancer Sister   . Colon cancer Neg Hx   . Stomach cancer Neg Hx     Allergies  Allergen Reactions  . Codeine Phosphate     REACTION: headache  . Morphine Sulfate     REACTION: tongue swelling    Current Outpatient Prescriptions on File Prior to Visit  Medication Sig Dispense Refill  . estradiol (ESTRACE) 1 MG tablet Take 1 mg by mouth daily.      . ranitidine (ZANTAC) 75 MG tablet Take 75 mg by mouth as needed.      Marland Kitchen spironolactone-hydrochlorothiazide (ALDACTAZIDE) 25-25 MG per tablet TAKE ONE TABLET EACH DAY  30 tablet  5  . Vitamin D, Ergocalciferol, (DRISDOL) 50000 UNITS CAPS Take 50,000 Units by mouth every 7 (seven) days.       No current facility-administered medications on file prior to visit.    BP 150/90  Pulse 75  Temp(Src) 98 F (36.7 C) (Oral)  Resp 20  Wt 183  lb (83.008 kg)  BMI 30.45 kg/m2  SpO2 96%     Review of Systems  Constitutional: Negative.   HENT: Negative for hearing loss, congestion, sore throat, rhinorrhea, dental problem, sinus pressure and tinnitus.   Eyes: Negative for pain, discharge and visual disturbance.  Respiratory: Negative for cough and shortness of breath.   Cardiovascular: Negative for chest pain, palpitations and leg swelling.  Gastrointestinal: Negative for nausea, vomiting, abdominal pain, diarrhea, constipation, blood in stool and abdominal distention.  Genitourinary: Negative for dysuria, urgency, frequency, hematuria, flank pain, vaginal bleeding, vaginal discharge, difficulty urinating, vaginal pain and pelvic pain.  Musculoskeletal: Negative for joint swelling, arthralgias and gait problem.  Skin: Negative for rash.  Neurological: Negative for dizziness, syncope, speech difficulty, weakness, numbness and headaches.  Hematological: Negative for adenopathy.   Psychiatric/Behavioral: Negative for behavioral problems, dysphoric mood and agitation. The patient is not nervous/anxious.        Objective:   Physical Exam  Constitutional: She appears well-developed and well-nourished. No distress.  Repeat blood pressure 130/80  Musculoskeletal: She exhibits edema.  Trace right lateral ankle edema No calf tenderness or swelling          Assessment & Plan:   Right ankle edema. Probable mild venous insufficiency Diabetes mellitus. Will check a hemoglobin A1c  Recheck 4 months Low-salt diet recommended

## 2012-10-21 ENCOUNTER — Other Ambulatory Visit: Payer: Self-pay | Admitting: Internal Medicine

## 2012-12-03 ENCOUNTER — Telehealth: Payer: Self-pay | Admitting: Obstetrics & Gynecology

## 2012-12-03 NOTE — Telephone Encounter (Signed)
Spoke with pt about worsening frequency and bladder pain over the past 2 days. Pt reports it "hurts to sit." Pt was up 3-4 times last night to the bathroom. Pt has had bilat knee replacements and was told by Dr. Lovey Newcomer that she needs an antibiotic for infections right away so they don't "go to her knees." Sched appt tomorrow at 10:45 with SM.

## 2012-12-03 NOTE — Telephone Encounter (Signed)
Pt thinks she may have a bladder infection. Please call to shedule an appointment.

## 2012-12-04 ENCOUNTER — Encounter: Payer: Self-pay | Admitting: Obstetrics & Gynecology

## 2012-12-04 ENCOUNTER — Ambulatory Visit (INDEPENDENT_AMBULATORY_CARE_PROVIDER_SITE_OTHER): Payer: Medicare Other | Admitting: Obstetrics & Gynecology

## 2012-12-04 VITALS — Ht 64.5 in | Wt 181.0 lb

## 2012-12-04 DIAGNOSIS — R300 Dysuria: Secondary | ICD-10-CM

## 2012-12-04 DIAGNOSIS — E119 Type 2 diabetes mellitus without complications: Secondary | ICD-10-CM

## 2012-12-04 DIAGNOSIS — N390 Urinary tract infection, site not specified: Secondary | ICD-10-CM | POA: Diagnosis not present

## 2012-12-04 LAB — POCT URINALYSIS DIPSTICK
Ketones, UA: NEGATIVE
pH, UA: 6

## 2012-12-04 MED ORDER — ESTRADIOL 0.5 MG PO TABS: 0.5000 mg | ORAL_TABLET | Freq: Every day | ORAL | Status: DC

## 2012-12-04 MED ORDER — NITROFURANTOIN MONOHYD MACRO 100 MG PO CAPS: 100.0000 mg | ORAL_CAPSULE | Freq: Two times a day (BID) | ORAL | Status: DC

## 2012-12-04 NOTE — Progress Notes (Signed)
72 y.o. [id number] MarriedCaucasianF here for complaint of UTI like symptoms.  Having pressure and dysuria just like a UTI.  Urine here is negative.  No fever.  No flank pain.  Reports she had some issues with voiding yesterday.  Drank lots of water and this got better.  Last Hb A1C 6.1.  No vaginal bleeding  Has been going to [facility] for diabetes.  Wants to see someone local.  Would like my recommendation.  Wants breast exam while here today.  No new issues.  D/W pt 3D MMG.  No LMP recorded. Patient is postmenopausal.           Health Maintenance: Pap:  2013, h/o TAH/BSO History of abnormal Pap:  no MMG:  10/13 3D Colonoscopy:  12/13 with Dr. Juanda Chance, f/u 10 years BMD:   6/09, 2.0/-1.1 TDaP:  6/13 Screening Labs: PCP  Urine today: neg   Current Outpatient Prescriptions  Medication Sig Dispense Refill  . estradiol (ESTRACE) 1 MG tablet Take 1 mg by mouth daily.      . Magnesium 400 MG CAPS Take 800 mg by mouth daily.      . metFORMIN (GLUCOPHAGE) 500 MG tablet Take 500 mg by mouth daily with breakfast.       . naproxen sodium (ALEVE) 220 MG tablet Take 220 mg by mouth 2 (two) times daily with a meal.      . ranitidine (ZANTAC) 75 MG tablet Take 75 mg by mouth as needed.      Marland Kitchen spironolactone-hydrochlorothiazide (ALDACTAZIDE) 25-25 MG per tablet TAKE ONE TABLET EACH DAY  30 tablet  5  . Vitamin D, Ergocalciferol, (DRISDOL) 50000 UNITS CAPS Take 50,000 Units by mouth every 7 (seven) days.       No current facility-administered medications for this visit.    Family History  Problem Relation Age of Onset  . Heart disease Father   . Diabetes Paternal Uncle   . Ovarian cancer Maternal Grandmother   . Lung cancer Sister   . Colon cancer Neg Hx   . Stomach cancer Neg Hx     ROS:  Pertinent items are noted in HPI.  Otherwise, a comprehensive ROS was negative.  Exam:   Ht 5' 4.5" (1.638 m)  Wt 181 lb (82.101 kg)  BMI 30.6 kg/m2  Weight change: +6lbs   Height: 5' 4.5" (163.8 cm)  Ht  Readings from Last 3 Encounters:  12/04/12 5' 4.5" (1.638 m)  03/06/12 5\' 5"  (1.651 m)  02/25/12 5\' 5"  (1.651 m)    General appearance: alert, cooperative and appears stated age Lungs: clear to auscultation bilaterally Breasts: normal appearance, no masses or tenderness Heart: regular rate and rhythm Abdomen: soft, non-tender; bowel sounds normal; no masses,  no organomegaly Lymph nodes: Cervical, supraclavicular, and axillary nodes normal. No abnormal inguinal nodes palpated Neurologic: Grossly normal  Pelvic: External genitalia:  no lesions              Urethra:  normal appearing urethra with no masses, tenderness or lesions              Bartholins and Skenes: normal                 Vagina: normal appearing vagina with normal color and discharge, no lesions              Cervix: absent              Pap taken: no Bimanual Exam:  Uterus:  uterus absent  Adnexa: no mass, fullness, tenderness               Rectovaginal: Confirms               Anus:  normal sphincter tone, no lesions  A:  Dysuria Diabetes.  Will refer to Dr. Talmage Nap.  Has seen endocrinologist at [facility] but needs local physical.  P:   Urine culture pending.   Rx for macrobid 100mg  bid x 7 days to pharmacy.  Pt to start taking if symptoms return as culture will not be back for 48 hrs. Return for AEX 1 year.  An After Visit Summary was printed and given to the patient.

## 2012-12-04 NOTE — Patient Instructions (Addendum)
We will call with the urine culture results. 

## 2012-12-06 LAB — URINE CULTURE

## 2012-12-10 NOTE — Telephone Encounter (Signed)
Pt needing order for bone density scan. Would like to schedule the appointment for 1st of October.

## 2012-12-12 NOTE — Telephone Encounter (Signed)
Spoke with patient needs orders faxed to [facility] for BMD .

## 2012-12-12 NOTE — Telephone Encounter (Signed)
Order signed and on triage desk.

## 2012-12-15 ENCOUNTER — Other Ambulatory Visit: Payer: Self-pay | Admitting: Obstetrics & Gynecology

## 2012-12-15 DIAGNOSIS — Z1231 Encounter for screening mammogram for malignant neoplasm of breast: Secondary | ICD-10-CM

## 2012-12-15 DIAGNOSIS — M8580 Other specified disorders of bone density and structure, unspecified site: Secondary | ICD-10-CM

## 2012-12-16 ENCOUNTER — Other Ambulatory Visit: Payer: Self-pay | Admitting: Orthopedic Surgery

## 2012-12-16 DIAGNOSIS — M8580 Other specified disorders of bone density and structure, unspecified site: Secondary | ICD-10-CM

## 2012-12-16 NOTE — Telephone Encounter (Signed)
LM for pt that [facility]. Pt can call to make appt at her convenience.

## 2012-12-16 NOTE — Telephone Encounter (Signed)
BMD order faxed to [facility] 12/15/12 per AMY/cm

## 2012-12-19 ENCOUNTER — Ambulatory Visit (INDEPENDENT_AMBULATORY_CARE_PROVIDER_SITE_OTHER): Payer: Medicare Other | Admitting: *Deleted

## 2012-12-19 DIAGNOSIS — N390 Urinary tract infection, site not specified: Secondary | ICD-10-CM

## 2012-12-20 LAB — URINE CULTURE: Colony Count: 3000

## 2012-12-31 ENCOUNTER — Ambulatory Visit
Admission: RE | Admit: 2012-12-31 | Discharge: 2012-12-31 | Disposition: A | Discharge status: Home / Self Care | Payer: Medicare Other | Source: Ambulatory Visit | Attending: Obstetrics & Gynecology | Admitting: Obstetrics & Gynecology

## 2012-12-31 DIAGNOSIS — M899 Disorder of bone, unspecified: Secondary | ICD-10-CM | POA: Diagnosis not present

## 2012-12-31 DIAGNOSIS — Z1231 Encounter for screening mammogram for malignant neoplasm of breast: Secondary | ICD-10-CM

## 2012-12-31 DIAGNOSIS — M8580 Other specified disorders of bone density and structure, unspecified site: Secondary | ICD-10-CM

## 2013-01-01 DIAGNOSIS — I4460 Unspecified fascicular block: Secondary | ICD-10-CM | POA: Diagnosis not present

## 2013-01-01 DIAGNOSIS — R9431 Abnormal electrocardiogram [ECG] [EKG]: Secondary | ICD-10-CM | POA: Diagnosis not present

## 2013-01-01 DIAGNOSIS — E559 Vitamin D deficiency, unspecified: Secondary | ICD-10-CM | POA: Diagnosis not present

## 2013-01-01 DIAGNOSIS — Z23 Encounter for immunization: Secondary | ICD-10-CM | POA: Diagnosis not present

## 2013-01-01 DIAGNOSIS — R0609 Other forms of dyspnea: Secondary | ICD-10-CM | POA: Diagnosis not present

## 2013-01-01 DIAGNOSIS — R7309 Other abnormal glucose: Secondary | ICD-10-CM | POA: Diagnosis not present

## 2013-01-01 DIAGNOSIS — Z79899 Other long term (current) drug therapy: Secondary | ICD-10-CM | POA: Diagnosis not present

## 2013-01-01 DIAGNOSIS — E119 Type 2 diabetes mellitus without complications: Secondary | ICD-10-CM | POA: Diagnosis not present

## 2013-01-07 ENCOUNTER — Ambulatory Visit: Payer: Medicare Other

## 2013-01-07 ENCOUNTER — Other Ambulatory Visit: Payer: Medicare Other

## 2013-01-15 DIAGNOSIS — R609 Edema, unspecified: Secondary | ICD-10-CM | POA: Diagnosis not present

## 2013-01-15 DIAGNOSIS — E876 Hypokalemia: Secondary | ICD-10-CM | POA: Diagnosis not present

## 2013-01-15 DIAGNOSIS — E119 Type 2 diabetes mellitus without complications: Secondary | ICD-10-CM | POA: Diagnosis not present

## 2013-01-20 DIAGNOSIS — L719 Rosacea, unspecified: Secondary | ICD-10-CM | POA: Diagnosis not present

## 2013-01-22 ENCOUNTER — Ambulatory Visit: Payer: Self-pay | Admitting: Obstetrics & Gynecology

## 2013-01-28 ENCOUNTER — Ambulatory Visit: Payer: Self-pay | Admitting: Obstetrics & Gynecology

## 2013-01-30 ENCOUNTER — Ambulatory Visit: Payer: Self-pay | Admitting: Obstetrics & Gynecology

## 2013-02-17 DIAGNOSIS — H26499 Other secondary cataract, unspecified eye: Secondary | ICD-10-CM | POA: Diagnosis not present

## 2013-02-17 DIAGNOSIS — L719 Rosacea, unspecified: Secondary | ICD-10-CM | POA: Diagnosis not present

## 2013-03-04 ENCOUNTER — Telehealth: Payer: Self-pay | Admitting: Internal Medicine

## 2013-03-04 ENCOUNTER — Encounter: Payer: Self-pay | Admitting: Family Medicine

## 2013-03-04 NOTE — Telephone Encounter (Signed)
Called and spoke with pt and pt states.  Pt will try kaopectate for a while and to come in if this does not work per Dr. Lovell Sheehan.

## 2013-03-04 NOTE — Telephone Encounter (Signed)
Pt called to state she is unable to come in for appt today due to severe diarrhea and is unable to get off the toilet.  Pt requesting an rx to be called in to help control severe diarrhea.

## 2013-03-04 NOTE — Progress Notes (Signed)
Error   This encounter was created in error - please disregard. 

## 2013-03-04 NOTE — Telephone Encounter (Signed)
Patient Information:  Caller Name: Brittney Fox  Phone: [phone]  Patient: Brittney Fox, Brittney Fox  Gender: Female  DOB: 06/02/1940  Age: 72 Years  PCP: Birdie Sons (Adults only)  Office Follow Up:  Does the office need to follow up with this patient?: Yes  Instructions For The Office: Please call and advise   Symptoms  Reason For Call & Symptoms: Pt is calling to request Lomotil or some other medication to stop diarrhea. Pt started with diarrhea on Monday night 03/02/13. Pt reports 15 - 20 bms/day.  Pt is drinking lots of water. She is urinating. No vomiting. No fever. She has taken Immodium which has does nothing to help. Pt up all night to the bathroom.   Reviewed Health History In EMR: Yes  Reviewed Medications In EMR: Yes  Reviewed Allergies In EMR: Yes  Reviewed Surgeries / Procedures: Yes  Date of Onset of Symptoms: 03/02/2013  Guideline(s) Used:  Diarrhea  Disposition Per Guideline:   Go to Office Now  Reason For Disposition Reached:   Age > 60 years and has had > 6 diarrhea stools in past 24 hours  Advice Given:  N/A  RN Overrode Recommendation:  Patient Requests Prescription  Pt states she does not want to come into the office and would prefer to treat it at home if MD could write a script for her. Rn checked schedule/Dr. Cato Mulligan is not in today. RN went ahead and scheduled appt.

## 2013-03-04 NOTE — Telephone Encounter (Signed)
I have never seen this pt. PCP may be comfortbale with this, but if she is so sick can not come to appt maybe needs to be seen urgently - get friend or family or ride to ED if continued and worsening symptoms.

## 2013-03-06 ENCOUNTER — Encounter: Payer: Self-pay | Admitting: Internal Medicine

## 2013-03-06 ENCOUNTER — Ambulatory Visit (INDEPENDENT_AMBULATORY_CARE_PROVIDER_SITE_OTHER): Payer: Medicare Other | Admitting: Internal Medicine

## 2013-03-06 VITALS — BP 132/80 | HR 92 | Temp 98.1°F | Resp 20 | Wt 176.0 lb

## 2013-03-06 DIAGNOSIS — R197 Diarrhea, unspecified: Secondary | ICD-10-CM

## 2013-03-06 DIAGNOSIS — K589 Irritable bowel syndrome without diarrhea: Secondary | ICD-10-CM | POA: Diagnosis not present

## 2013-03-06 MED ORDER — CIPROFLOXACIN HCL 500 MG PO TABS: 500.0000 mg | ORAL_TABLET | Freq: Two times a day (BID) | ORAL | Status: DC

## 2013-03-06 MED ORDER — DIPHENOXYLATE-ATROPINE 2.5-0.025 MG PO TABS: 1.0000 | ORAL_TABLET | Freq: Four times a day (QID) | ORAL | Status: DC | PRN

## 2013-03-06 NOTE — Progress Notes (Signed)
Pre-visit discussion using our clinic review tool. No additional management support is needed unless otherwise documented below in the visit note.  

## 2013-03-06 NOTE — Patient Instructions (Signed)
Diarrhea Diarrhea is frequent loose and watery bowel movements. It can cause you to feel weak and dehydrated. Dehydration can cause you to become tired and thirsty, have a dry mouth, and have decreased urination that often is dark yellow. Diarrhea is a sign of another problem, most often an infection that will not last long. In most cases, diarrhea typically lasts 2 3 days. However, it can last longer if it is a sign of something more serious. It is important to treat your diarrhea as directed by your caregive to lessen or prevent future episodes of diarrhea. CAUSES  Some common causes include:  Gastrointestinal infections caused by viruses, bacteria, or parasites.  Food poisoning or food allergies.  Certain medicines, such as antibiotics, chemotherapy, and laxatives.  Artificial sweeteners and fructose.  Digestive disorders. HOME CARE INSTRUCTIONS  Ensure adequate fluid intake (hydration): have 1 cup (8 oz) of fluid for each diarrhea episode. Avoid fluids that contain simple sugars or sports drinks, fruit juices, whole milk products, and sodas. Your urine should be clear or pale yellow if you are drinking enough fluids. Hydrate with an oral rehydration solution that you can purchase at pharmacies, retail stores, and online. You can prepare an oral rehydration solution at home by mixing the following ingredients together:    tsp table salt.   tsp baking soda.   tsp salt substitute containing potassium chloride.  1  tablespoons sugar.  1 L (34 oz) of water.  Certain foods and beverages may increase the speed at which food moves through the gastrointestinal (GI) tract. These foods and beverages should be avoided and include:  Caffeinated and alcoholic beverages.  High-fiber foods, such as raw fruits and vegetables, nuts, seeds, and whole grain breads and cereals.  Foods and beverages sweetened with sugar alcohols, such as xylitol, sorbitol, and mannitol.  Some foods may be well  tolerated and may help thicken stool including:  Starchy foods, such as rice, toast, pasta, low-sugar cereal, oatmeal, grits, baked potatoes, crackers, and bagels.  Bananas.  Applesauce.  Add probiotic-rich foods to help increase healthy bacteria in the GI tract, such as yogurt and fermented milk products.  Wash your hands well after each diarrhea episode.  Only take over-the-counter or prescription medicines as directed by your caregiver.  Take a warm bath to relieve any burning or pain from frequent diarrhea episodes. SEEK IMMEDIATE MEDICAL CARE IF:   You are unable to keep fluids down.  You have persistent vomiting.  You have blood in your stool, or your stools are black and tarry.  You do not urinate in 6 8 hours, or there is only a small amount of very dark urine.  You have abdominal pain that increases or localizes.  You have weakness, dizziness, confusion, or lightheadedness.  You have a severe headache.  Your diarrhea gets worse or does not get better.  You have a fever or persistent symptoms for more than 2 3 days.  You have a fever and your symptoms suddenly get worse. MAKE SURE YOU:   Understand these instructions.  Will watch your condition.  Will get help right away if you are not doing well or get worse. Document Released: 03/09/2002 Document Revised: 03/05/2012 Document Reviewed: 11/25/2011 [employer] Patient Information 2014 [employer]. Diet for Diarrhea, Adult Frequent, runny stools (diarrhea) may be caused or worsened by food or drink. Diarrhea may be relieved by changing your diet. Since diarrhea can last up to 7 days, it is easy for you to lose too much fluid   from the body and become dehydrated. Fluids that are lost need to be replaced. Along with a modified diet, make sure you drink enough fluids to keep your urine clear or pale yellow. DIET INSTRUCTIONS  Ensure adequate fluid intake (hydration): have 1 cup (8 oz) of fluid for each diarrhea  episode. Avoid fluids that contain simple sugars or sports drinks, fruit juices, whole milk products, and sodas. Your urine should be clear or pale yellow if you are drinking enough fluids. Hydrate with an oral rehydration solution that you can purchase at pharmacies, retail stores, and online. You can prepare an oral rehydration solution at home by mixing the following ingredients together:    tsp table salt.   tsp baking soda.   tsp salt substitute containing potassium chloride.  1  tablespoons sugar.  1 L (34 oz) of water.  Certain foods and beverages may increase the speed at which food moves through the gastrointestinal (GI) tract. These foods and beverages should be avoided and include:  Caffeinated and alcoholic beverages.  High-fiber foods, such as raw fruits and vegetables, nuts, seeds, and whole grain breads and cereals.  Foods and beverages sweetened with sugar alcohols, such as xylitol, sorbitol, and mannitol.  Some foods may be well tolerated and may help thicken stool including:  Starchy foods, such as rice, toast, pasta, low-sugar cereal, oatmeal, grits, baked potatoes, crackers, and bagels.   Bananas.   Applesauce.  Add probiotic-rich foods to help increase healthy bacteria in the GI tract, such as yogurt and fermented milk products. RECOMMENDED FOODS AND BEVERAGES Starches Choose foods with less than 2 g of fiber per serving.  Recommended:  White, French, and pita breads, plain rolls, buns, bagels. Plain muffins, matzo. Soda, saltine, or graham crackers. Pretzels, melba toast, zwieback. Cooked cereals made with water: cornmeal, farina, cream cereals. Dry cereals: refined corn, wheat, rice. Potatoes prepared any way without skins, refined macaroni, spaghetti, noodles, refined rice.  Avoid:  Bread, rolls, or crackers made with whole wheat, multi-grains, rye, bran seeds, nuts, or coconut. Corn tortillas or taco shells. Cereals containing whole grains, multi-grains,  bran, coconut, nuts, raisins. Cooked or dry oatmeal. Coarse wheat cereals, granola. Cereals advertised as "high-fiber." Potato skins. Whole grain pasta, wild or brown rice. Popcorn. Sweet potatoes, yams. Sweet rolls, doughnuts, waffles, pancakes, sweet breads. Vegetables  Recommended: Strained tomato and vegetable juices. Most well-cooked and canned vegetables without seeds. Fresh: Tender lettuce, cucumber without the skin, cabbage, spinach, bean sprouts.  Avoid: Fresh, cooked, or canned: Artichokes, baked beans, beet greens, broccoli, Brussels sprouts, corn, kale, legumes, peas, sweet potatoes. Cooked: Green or red cabbage, spinach. Avoid large servings of any vegetables because vegetables shrink when cooked, and they contain more fiber per serving than fresh vegetables. Fruit  Recommended: Cooked or canned: Apricots, applesauce, cantaloupe, cherries, fruit cocktail, grapefruit, grapes, kiwi, mandarin oranges, peaches, pears, plums, watermelon. Fresh: Apples without skin, ripe banana, grapes, cantaloupe, cherries, grapefruit, peaches, oranges, plums. Keep servings limited to  cup or 1 piece.  Avoid: Fresh: Apples with skin, apricots, mangoes, pears, raspberries, strawberries. Prune juice, stewed or dried prunes. Dried fruits, raisins, dates. Large servings of all fresh fruits. Protein  Recommended: Ground or well-cooked tender beef, ham, veal, lamb, pork, or poultry. Eggs. Fish, oysters, shrimp, lobster, other seafoods. Liver, organ meats.  Avoid: Tough, fibrous meats with gristle. Peanut butter, smooth or chunky. Cheese, nuts, seeds, legumes, dried peas, beans, lentils. Dairy  Recommended: Yogurt, lactose-free milk, kefir, drinkable yogurt, buttermilk, soy milk, or plain hard cheese.    Avoid: Milk, chocolate milk, beverages made with milk, such as milkshakes. Soups  Recommended: Bouillon, broth, or soups made from allowed foods. Any strained soup.  Avoid: Soups made from vegetables that  are not allowed, cream or milk-based soups. Desserts and Sweets  Recommended: Sugar-free gelatin, sugar-free frozen ice pops made without sugar alcohol.  Avoid: Plain cakes and cookies, pie made with fruit, pudding, custard, cream pie. Gelatin, fruit, ice, sherbet, frozen ice pops. Ice cream, ice milk without nuts. Plain hard candy, honey, jelly, molasses, syrup, sugar, chocolate syrup, gumdrops, marshmallows. Fats and Oils  Recommended: Limit fats to less than 8 tsp per day.  Avoid: Seeds, nuts, olives, avocados. Margarine, butter, cream, mayonnaise, salad oils, plain salad dressings. Plain gravy, crisp bacon without rind. Beverages  Recommended: Water, decaffeinated teas, oral rehydration solutions, sugar-free beverages not sweetened with sugar alcohols.  Avoid: Fruit juices, caffeinated beverages (coffee, tea, soda), alcohol, sports drinks, or lemon-lime soda. Condiments  Recommended: Ketchup, mustard, horseradish, vinegar, cocoa powder. Spices in moderation: allspice, basil, bay leaves, celery powder or leaves, cinnamon, cumin powder, curry powder, ginger, mace, marjoram, onion or garlic powder, oregano, paprika, parsley flakes, ground pepper, rosemary, sage, savory, tarragon, thyme, turmeric.  Avoid: Coconut, honey. Document Released: 06/09/2003 Document Revised: 12/12/2011 Document Reviewed: 08/03/2011 [employer] Patient Information 2014 [employer].  

## 2013-03-06 NOTE — Progress Notes (Signed)
Subjective:    Patient ID: Brittney Fox, female    DOB: [birth year], 72 y.o.   MRN: [id number]  HPI Pre-visit discussion using our clinic review tool. No additional management support is needed unless otherwise documented below in the visit note.  72 year old patient who has a history of mild IBS. For the past 7 days she has had profuse watery diarrhea. She and her extended family recently spent time together during Thanksgiving but no one else has been ill. She has had some mild nausea but no vomiting. Diarrhea is watery but nonbloody. She does have nocturnal symptoms. She has tried Pepto-Bismol as well as Kaopectate with only marginal benefit. She has been able to tolerate a clear liquid diet  Past Medical History  Diagnosis Date  . Hyperlipidemia   . Asthma   . Rosacea     opitcal  . Other premature beats   . Diverticulosis of colon (without mention of hemorrhage)   . Irritable bowel syndrome   . Other mucopurulent conjunctivitis   . Dysphagia, unspecified(787.20)   . Raynaud's syndrome   . Diabetes mellitus     managed by diet and exercise  . Spinal stenosis     History   Social History  . Marital Status: Married    Spouse Name: N/A    Number of Children: 2  . Years of Education: N/A   Occupational History  . [employer]    Social History Main Topics  . Smoking status: Former Smoker    Quit date: 10/22/1961  . Smokeless tobacco: Never Used  . Alcohol Use: No     Comment: alcohol makes her swell  . Drug Use: No  . Sexual Activity: Not on file   Other Topics Concern  . Not on file   Social History Narrative  . No narrative on file    Past Surgical History  Procedure Laterality Date  . Abdominal hysterectomy  1990  . Replacement total knee  2005, 2007    bilateral  . Lens implant    . Cholecystectomy    . Dental implants    . Cataract extraction      bilateral  . Foot surgery    . Hip surgery  1973    bilateral for bursitis    Family History    Problem Relation Age of Onset  . Heart disease Father   . Diabetes Paternal Uncle   . Ovarian cancer Maternal Grandmother   . Lung cancer Sister   . Colon cancer Neg Hx   . Stomach cancer Neg Hx     Allergies  Allergen Reactions  . Codeine Phosphate     REACTION: headache  . Morphine Sulfate     REACTION: tongue swelling    Current Outpatient Prescriptions on File Prior to Visit  Medication Sig Dispense Refill  . estradiol (ESTRACE) 0.5 MG tablet Take 1 tablet (0.5 mg total) by mouth daily. Take 0.5 mg by mouth daily.  30 tablet  13  . glucose blood ([facility] BLOOD GLUCOSE TEST STRIPS) test strip Use as instructed to test 2 times daily      . magnesium oxide (MAG-OX) 400 MG tablet Take by mouth. Take 400 mg by mouth 2 (two) times daily.      . metFORMIN (GLUCOPHAGE) 500 MG tablet Take 500 mg by mouth daily with breakfast.       . naproxen sodium (ALEVE) 220 MG tablet Take 220 mg by mouth 2 (two) times daily with a meal.      .  potassium chloride (KLOR-CON) 20 MEQ packet Take 20 mEq by mouth as needed. as needed.      . ranitidine (ZANTAC) 75 MG tablet Take 75 mg by mouth as needed.      Marland Kitchen spironolactone-hydrochlorothiazide (ALDACTAZIDE) 25-25 MG per tablet TAKE ONE TABLET EACH DAY  30 tablet  5  . Vitamin D, Ergocalciferol, (DRISDOL) 50000 UNITS CAPS Take 50,000 Units by mouth every 7 (seven) days.       No current facility-administered medications on file prior to visit.    BP 132/80  Pulse 92  Temp(Src) 98.1 F (36.7 C) (Oral)  Resp 20  Wt 176 lb (79.833 kg)  SpO2 98%       Review of Systems  Constitutional: Negative.   HENT: Negative for congestion, dental problem, hearing loss, rhinorrhea, sinus pressure, sore throat and tinnitus.   Eyes: Negative for pain, discharge and visual disturbance.  Respiratory: Negative for cough and shortness of breath.   Cardiovascular: Negative for chest pain, palpitations and leg swelling.  Gastrointestinal: Positive for nausea and  diarrhea. Negative for vomiting, abdominal pain, constipation, blood in stool and abdominal distention.  Genitourinary: Negative for dysuria, urgency, frequency, hematuria, flank pain, vaginal bleeding, vaginal discharge, difficulty urinating, vaginal pain and pelvic pain.  Musculoskeletal: Negative for arthralgias, gait problem and joint swelling.  Skin: Negative for rash.  Neurological: Negative for dizziness, syncope, speech difficulty, weakness, numbness and headaches.  Hematological: Negative for adenopathy.  Psychiatric/Behavioral: Negative for behavioral problems, dysphoric mood and agitation. The patient is not nervous/anxious.        Objective:   Physical Exam  Constitutional: She is oriented to person, place, and time. She appears well-developed and well-nourished.  HENT:  Head: Normocephalic.  Right Ear: External ear normal.  Left Ear: External ear normal.  Mouth/Throat: Oropharynx is clear and moist.  Appears well-hydrated  Eyes: Conjunctivae and EOM are normal. Pupils are equal, round, and reactive to light.  Neck: Normal range of motion. Neck supple. No thyromegaly present.  Cardiovascular: Normal rate, regular rhythm, normal heart sounds and intact distal pulses.   Pulmonary/Chest: Effort normal and breath sounds normal.  Abdominal: Soft. Bowel sounds are normal. She exhibits no mass. There is no tenderness. There is no rebound and no guarding.  Musculoskeletal: Normal range of motion.  Lymphadenopathy:    She has no cervical adenopathy.  Neurological: She is alert and oriented to person, place, and time.  Skin: Skin is warm and dry. No rash noted.  Psychiatric: She has a normal mood and affect. Her behavior is normal.          Assessment & Plan:   Diarrhea.  Stool evaluation for enteric pathogens not cost effective. Due to the severity and intensity of her symptoms will treat empirically with Cipro for 5 days. We'll give a prescription for Lomotil. She will call  if she does not improve. Samples of a probiotic also dispensed (Restora)

## 2013-03-09 ENCOUNTER — Telehealth: Payer: Self-pay | Admitting: Internal Medicine

## 2013-03-09 NOTE — Telephone Encounter (Signed)
Spoke to pt told her to discontinue Cipro no further abx needed, and advance diet as tolerated per Dr.Kwiatkowski. Pt verbalized understanding and stated still having chills off and on. Asked pt if taking temperature. Pt stated no. Told pt to monitor temp if 101 call back. Pt verbalized understanding.

## 2013-03-09 NOTE — Telephone Encounter (Signed)
Please see message and advise 

## 2013-03-09 NOTE — Telephone Encounter (Signed)
Discontinue Cipro(done) No further antibiotics needed Advance diet as tolerated

## 2013-03-09 NOTE — Telephone Encounter (Signed)
Patient is calling regarding the Cipro she was placed on for a gastro intestinal issue she has.  She states that she vomited x2 with the antibiotic and she quit taking it on Friday 03/06/2013 due to it making it vomit.  She is still having chills, but no diarrhea.  She wants Lupita Leash to return her call as soon as she can.  She is afebrile today.  She thinks she may have had a fever over the weekend, however, she did not check it.  She did not call over the weekend because the diarrhea has subsided.

## 2013-03-11 ENCOUNTER — Telehealth: Payer: Self-pay | Admitting: Internal Medicine

## 2013-03-11 NOTE — Telephone Encounter (Signed)
Patient states Dr. Juanda Chance said for her to call and get on schedule for Friday. Dr Juanda Chance, you have 9 on your schedule. Do you want me to add this patient also?

## 2013-03-11 NOTE — Telephone Encounter (Signed)
Scheduled patient as per Dr. Juanda Chance. Patient notified.

## 2013-03-11 NOTE — Telephone Encounter (Signed)
Yes, we will have to, start at 1.00 pm, please

## 2013-03-12 ENCOUNTER — Encounter: Payer: Self-pay | Admitting: *Deleted

## 2013-03-13 ENCOUNTER — Ambulatory Visit (INDEPENDENT_AMBULATORY_CARE_PROVIDER_SITE_OTHER): Payer: Medicare Other | Admitting: Internal Medicine

## 2013-03-13 ENCOUNTER — Encounter: Payer: Self-pay | Admitting: Internal Medicine

## 2013-03-13 ENCOUNTER — Other Ambulatory Visit (INDEPENDENT_AMBULATORY_CARE_PROVIDER_SITE_OTHER): Payer: Medicare Other

## 2013-03-13 VITALS — BP 120/80 | HR 80 | Ht 64.5 in | Wt 176.0 lb

## 2013-03-13 DIAGNOSIS — K5289 Other specified noninfective gastroenteritis and colitis: Secondary | ICD-10-CM

## 2013-03-13 DIAGNOSIS — R197 Diarrhea, unspecified: Secondary | ICD-10-CM

## 2013-03-13 LAB — BASIC METABOLIC PANEL
BUN: 10 mg/dL (ref 6–23)
Calcium: 9.4 mg/dL (ref 8.4–10.5)
Chloride: 91 mEq/L — ABNORMAL LOW (ref 96–112)
GFR: 93.64 mL/min (ref >60.00)
Glucose, Bld: 93 mg/dL (ref 70–99)
Sodium: 134 mEq/L — ABNORMAL LOW (ref 135–145)

## 2013-03-13 LAB — SEDIMENTATION RATE: Sed Rate: 17 mm/hr (ref 0–22)

## 2013-03-13 MED ORDER — BUDESONIDE 3 MG PO CP24: ORAL_CAPSULE | ORAL | Status: DC

## 2013-03-13 NOTE — Patient Instructions (Addendum)
We have sent the following medications to your pharmacy for you to pick up at your convenience: Entocort 3 tablets daily x 1 week     2 tablets daily x 1 week                1 tablet daily x 1 week  Please call our office in 1 week with an update on your condition. Please call [phone] and ask to speak to [facility].  Your physician has requested that you go to the basement for the following lab work before leaving today: Sed Rate, lactoferrin, C Diff by PCR, BMET  Please purchase Restora over the counter and take as directed.  STOP Mag Oxide  CC:Dr Birdie Sons

## 2013-03-13 NOTE — Progress Notes (Signed)
Brittney Fox 02-Feb-1941 [id number]   History of Present Illness:  This is a 72 year old white female with a history of collagenous colitis diagnosed on a colonoscopy in August 2006. Her last colonoscopy in December 2013 showed diverticulosis. She has a history of diarrhea which at times has been profuse. This time, she had a flareup  starting at Thanksgiving while vacationing with her family and she now continues to have frequent and incontinent stools during the day as well as at night. She denies any bleeding or fever. She has been on metformin 500 mg a day, Naprosyn 220 two  twice a day and magnesium oxide. She, in the past, took Cipro and Flagyl for the diarrhea, but this time, Cipro made her sick.  In 2006, she responded to budesonide 9 mg daily. She is taking Lomotil and probiotics which seemed to help with diarrhea. She had a polyp on her colonoscopy in 1988 but no polyps in 1997.  Past Medical History  Diagnosis Date  . Hyperlipidemia   . Asthma   . Rosacea     opitcal  . Premature ventricular contractions   . Diverticulosis of colon (without mention of hemorrhage)   . Irritable bowel syndrome   . Other mucopurulent conjunctivitis   . Raynaud's syndrome   . Diabetes mellitus     managed by diet and exercise  . Spinal stenosis   . Anal fissure     as a child  . Osteoarthritis   . Lymphocytic colitis     chronic  . Fatty liver     Past Surgical History  Procedure Laterality Date  . Abdominal hysterectomy  1990  . Replacement total knee Bilateral 2005, 2007  . Lens implant    . Cholecystectomy    . Dental implants    . Cataract extraction Bilateral   . Foot surgery    . Hip surgery Bilateral 1973    Allergies  Allergen Reactions  . Codeine Phosphate     REACTION: headache  . Morphine Sulfate     REACTION: tongue swelling    The remainder of the 10 point ROS is negative except as outlined in the H&P  Physical Exam: General Appearance Well developed, in no  distress Eyes  Non icteric  HEENT  Non traumatic, normocephalic  Mouth No lesion, tongue papillated, no cheilosis Neck Supple without adenopathy, thyroid not enlarged, no carotid bruits, no JVD Lungs Clear to auscultation bilaterally COR Normal S1, normal S2, regular rhythm, no murmur, quiet precordium Abdomen Soft, mild diffuse tenderness with hyperactive  bowel sounds Rectal Normal rectal sphincter tone. Stool is Hemoccult negative. Extremities  No pedal edema. Skin No lesions Neurological Alert and oriented x 3 Psychological Normal mood and affect  Assessment and Plan:   Problem #78 72 year old white female with a history of microscopic colitis which has most likely flared up at this time. She may have postinfectious colitis. She also has a history of irritable bowel syndrome and diverticulosis. This time she was unable to tolerate Cipro. I have discussed a possible flexible sigmoidoscopy and biopsies to rule out microscopic colitis but she prefers to be treated empirically. We will start her on budesonide 9 mg daily for one week. She will call us in one week with an update and we may at that point be able to reduce it to 6 mg daily. She is a diabetic and will be checking her blood sugars while taking budesonide. We will also check a sedimentation rate and B-met. She will stay  on a low-residue diet and take Lomotil when necessary. We will also obtain a stool for lactoferrin and C. Difficile. She is up-to-date on her colonoscopy.    Brittney Fox 03/13/2013

## 2013-03-16 ENCOUNTER — Other Ambulatory Visit: Payer: Medicare Other

## 2013-03-16 DIAGNOSIS — R197 Diarrhea, unspecified: Secondary | ICD-10-CM

## 2013-03-17 LAB — FECAL LACTOFERRIN, QUANT: Lactoferrin: POSITIVE

## 2013-03-23 ENCOUNTER — Other Ambulatory Visit: Payer: Self-pay | Admitting: Internal Medicine

## 2013-03-24 ENCOUNTER — Telehealth: Payer: Self-pay | Admitting: Internal Medicine

## 2013-03-24 DIAGNOSIS — R197 Diarrhea, unspecified: Secondary | ICD-10-CM

## 2013-03-24 MED ORDER — DIPHENOXYLATE-ATROPINE 2.5-0.025 MG PO TABS: ORAL_TABLET | ORAL | Status: DC

## 2013-03-24 NOTE — Telephone Encounter (Signed)
Patient states she is feeling better. She is watching what she eats. Usually only has trouble in AM. She is asking for stool results and an rx for Lomotil.(she is going out of town and would like to have some on hand)  Please, advise.

## 2013-03-24 NOTE — Telephone Encounter (Signed)
Spoke with patient and gave her results. She is doing much better at least 80% better She will wait on collecting stool again. Rx called to pharmacy. Patient aware.

## 2013-03-24 NOTE — Telephone Encounter (Signed)
Waiting for CDiff but not completed. OK to refill Lomotil. If she is 70-80% better then don't bother with repeating the C.Diff. Her stool Lactoferin was positive, which is c/w collagenous colitis.

## 2013-04-01 ENCOUNTER — Telehealth: Payer: Self-pay | Admitting: Internal Medicine

## 2013-04-01 ENCOUNTER — Encounter: Payer: Self-pay | Admitting: Internal Medicine

## 2013-04-01 ENCOUNTER — Ambulatory Visit (INDEPENDENT_AMBULATORY_CARE_PROVIDER_SITE_OTHER): Payer: Medicare Other | Admitting: Internal Medicine

## 2013-04-01 VITALS — BP 122/80 | HR 78 | Temp 98.2°F | Ht 64.5 in | Wt 176.0 lb

## 2013-04-01 DIAGNOSIS — J209 Acute bronchitis, unspecified: Secondary | ICD-10-CM

## 2013-04-01 MED ORDER — HYDROCODONE-HOMATROPINE 5-1.5 MG/5ML PO SYRP: 5.0000 mL | ORAL_SOLUTION | Freq: Four times a day (QID) | ORAL | Status: DC | PRN

## 2013-04-01 MED ORDER — OSELTAMIVIR PHOSPHATE 75 MG PO CAPS: 75.0000 mg | ORAL_CAPSULE | Freq: Two times a day (BID) | ORAL | Status: DC

## 2013-04-01 NOTE — Telephone Encounter (Signed)
Noted  

## 2013-04-01 NOTE — Assessment & Plan Note (Signed)
liekly viral illness, prob influenza, for tamiflu x 5 days, cough med,  to f/u any worsening symptoms or concerns

## 2013-04-01 NOTE — Patient Instructions (Signed)
Please take all new medication as prescribed - the tamiflu, and the cough medicine as needed Please continue all other medications as before You can also take Mucinex (or it's generic off brand) for congestion, and tylenol as needed for pain.  Please remember to sign up for My Chart if you have not done so, as this will be important to you in the future with finding out test results, communicating by private email, and scheduling acute appointments online when needed.

## 2013-04-01 NOTE — Progress Notes (Signed)
Subjective:    Patient ID: Brittney Fox, female    DOB: [birth year], 72 y.o.   MRN: [id number]  HPI  Here with acute onset mild to mod 2-3 days ST, HA, general weakness and malaise, with prod cough clear/yellowish sputum, but Pt denies chest pain, increased sob or doe, wheezing, orthopnea, PND, increased LE swelling, palpitations, dizziness or syncope.  Past Medical History  Diagnosis Date  . Hyperlipidemia   . Asthma   . Rosacea     opitcal  . Premature ventricular contractions   . Diverticulosis of colon (without mention of hemorrhage)   . Irritable bowel syndrome   . Other mucopurulent conjunctivitis   . Raynaud's syndrome   . Diabetes mellitus     managed by diet and exercise  . Spinal stenosis   . Anal fissure     as a child  . Osteoarthritis   . Lymphocytic colitis     chronic  . Fatty liver    Past Surgical History  Procedure Laterality Date  . Abdominal hysterectomy  1990  . Replacement total knee Bilateral 2005, 2007  . Lens implant    . Cholecystectomy    . Dental implants    . Cataract extraction Bilateral   . Foot surgery    . Hip surgery Bilateral 1973    reports that she quit smoking about 51 years ago. She has never used smokeless tobacco. She reports that she does not drink alcohol or use illicit drugs. family history includes Diabetes in her paternal uncle; Heart disease in her father; Lung cancer in her sister; Ovarian cancer in her maternal grandmother. There is no history of Colon cancer or Stomach cancer. Allergies  Allergen Reactions  . Codeine Phosphate     REACTION: headache  . Morphine Sulfate     REACTION: tongue swelling   Current Outpatient Prescriptions on File Prior to Visit  Medication Sig Dispense Refill  . budesonide (ENTOCORT EC) 3 MG 24 hr capsule Take 3 capsule by mouth once daily x 1 week, then take 2 tablets by mouth once daily x 1 week, then take 1 tablet by mouth once daily x 1 week.  43 capsule  0  . chlorhexidine (PERIDEX)  0.12 % solution Use as directed 5 mLs in the mouth or throat as needed.       . diphenoxylate-atropine (LOMOTIL) 2.5-0.025 MG per tablet TAKE ONE TABLET FOUR TIMES DAILY AS NEEDED FOR DIARRHEA OR LOOSE STOOLS  30 tablet  0  . estradiol (ESTRACE) 0.5 MG tablet Take 1 tablet (0.5 mg total) by mouth daily. Take 0.5 mg by mouth daily.  30 tablet  13  . glucose blood ([facility] BLOOD GLUCOSE TEST STRIPS) test strip Use as instructed to test 2 times daily      . magnesium oxide (MAG-OX) 400 MG tablet Take by mouth. Take 400 mg by mouth 2 (two) times daily.      . metFORMIN (GLUCOPHAGE) 500 MG tablet Take 500 mg by mouth daily with breakfast.       . potassium chloride (KLOR-CON) 20 MEQ packet Take 20 mEq by mouth as needed. as needed.      . ranitidine (ZANTAC) 75 MG tablet Take 75 mg by mouth as needed.      Marland Kitchen spironolactone-hydrochlorothiazide (ALDACTAZIDE) 25-25 MG per tablet TAKE ONE TABLET EACH DAY  30 tablet  5  . Vitamin D, Ergocalciferol, (DRISDOL) 50000 UNITS CAPS Take 50,000 Units by mouth every 7 (seven) days.  No current facility-administered medications on file prior to visit.   Review of Systems All otherwise neg per pt     Objective:   Physical Exam BP 122/80  Pulse 78  Temp(Src) 98.2 F (36.8 C) (Oral)  Ht 5' 4.5" (1.638 m)  Wt 176 lb (79.833 kg)  BMI 29.75 kg/m2  SpO2 97% VS noted,  Constitutional: Pt appears well-developed and well-nourished.  HENT: Head: NCAT.  Right Ear: External ear normal.  Left Ear: External ear normal.  Eyes: Conjunctivae and EOM are normal. Pupils are equal, round, and reactive to light.  Neck: Normal range of motion. Neck supple.  Cardiovascular: Normal rate and regular rhythm.   Bilat tm's with mild erythema.  Max sinus areas non tender.  Pharynx with mild erythema, no exudate Pulmonary/Chest: Effort normal and breath sounds normal.  Neurological: Pt is alert. Not confused  Skin: Skin is warm. No erythema.  Psychiatric: Pt behavior is  normal. Thought content normal.     Assessment & Plan:

## 2013-04-01 NOTE — Telephone Encounter (Signed)
Patient Information:  Caller Name: Nimrit  Phone: [phone]  Patient: Brittney Fox, Brittney Fox  Gender: Female  DOB: Sep 15, 1940  Age: 72 Years  PCP: Birdie Sons (Adults only)  Office Follow Up:  Does the office need to follow up with this patient?: No  Instructions For The Office: N/A  RN Note:  Patient states she developed sore throat, headache, cough, congestion, fever. Onset 1630 03/31/13. Patient had Tylenol 1000mg . at 0330 04/01/13. Patient taking fluids well. Urinating normally for patient. Denies wheezing. Patient states she is expectorating yellow mucous. Care advice given per guidelines. Call back parameters reviewed. Patient verbalizes understanding. No appts. available at [facility] office. RN spoke with Cindee Lame at [place] office. Per Cindee Lame, in office, Appt. scheduled with Dr. Oliver Barre at 12:45 04/01/13 at [facility] office. Patient informed of above and agreeable.   Symptoms  Reason For Call & Symptoms: Fever, cough, congestion, headache, sore throat  Reviewed Health History In EMR: Yes  Reviewed Medications In EMR: Yes  Reviewed Allergies In EMR: Yes  Reviewed Surgeries / Procedures: Yes  Date of Onset of Symptoms: 03/31/2013  Treatments Tried: Advil, Tylenol, Chloraseptic  Treatments Tried Worked: No  Any Fever: Yes  Fever Taken: Oral  Fever Time Of Reading: 07:30:00  Fever Last Reading: 101  Guideline(s) Used:  Influenza - Seasonal  Disposition Per Guideline:   Go to Office Now  Reason For Disposition Reached:   Fever > 100.5 F (38.1 C) and over 61 years of age  Advice Given:  Treating the Symptoms of Flu  Fever, Muscle Aches, and Headache: For fever more than 101 F (38.3 C), muscle aches, and headaches, take acetaminophen every 4-6 hours (Adults 650 mg) OR ibuprofen every 6-8 hours (Adults 400-600 mg).  Sore Throat: Use throat lozenges, hard candy or warm chicken broth.  Hydrate: Drink extra liquids. If the air in your home is dry, use a humidifier.  Treating the  Symptoms of Flu  Fever, Muscle Aches, and Headache: For fever more than 101 F (38.3 C), muscle aches, and headaches, take acetaminophen every 4-6 hours (Adults 650 mg) OR ibuprofen every 6-8 hours (Adults 400-600 mg).  Sore Throat: Use throat lozenges, hard candy or warm chicken broth.  Cough: Use cough drops.  Hydrate: Drink extra liquids. If the air in your home is dry, use a humidifier.  Call Back If:  You become short of breath or worse.  For a Runny Nose With Profuse Discharge:   Nasal mucus and discharge helps to wash viruses and bacteria out of the nose and sinuses.  For a Stuffy Nose - Use Nasal Washes:  Introduction: Saline (salt water) nasal irrigation (nasal wash) is an effective and simple home remedy for treating stuffy nose and sinus congestion. The nose can be irrigated by pouring, spraying, or squirting salt water into the nose and then letting it run back out.  For all Fevers  Drink cold fluids to prevent dehydration.  Dress in 1 layer of lightweight clothing and sleep with 1 light blanket.  Pain and Fever Medicines:  For pain or fever relief, take either acetaminophen or ibuprofen.  Patient Will Follow Care Advice:  YES  Appointment Scheduled:  04/01/2013 12:45:00 Appointment Scheduled Provider:  Other

## 2013-04-01 NOTE — Progress Notes (Signed)
Pre visit review using our clinic review tool, if applicable. No additional management support is needed unless otherwise documented below in the visit note. 

## 2013-04-16 ENCOUNTER — Telehealth: Payer: Self-pay | Admitting: *Deleted

## 2013-04-16 MED ORDER — BUDESONIDE 3 MG PO CP24: ORAL_CAPSULE | ORAL | Status: DC

## 2013-04-16 NOTE — Telephone Encounter (Signed)
Rx called to pharmacy.  Patient aware  

## 2013-04-16 NOTE — Telephone Encounter (Signed)
Message copied by Hulan Saas on Thu Apr 16, 2013  4:13 PM ------      Message from: Lafayette Dragon      Created: Thu Apr 16, 2013  3:19 PM      Regarding: Chipper Oman, I spoke with  Mrs Kenna Gilbert , please send Entecort 3 mg,  Take 2 po qd x 4 weeks then 1 po qd x 4 weeks, # 60, 1 refill. ------

## 2013-04-17 DIAGNOSIS — E119 Type 2 diabetes mellitus without complications: Secondary | ICD-10-CM | POA: Diagnosis not present

## 2013-04-21 DIAGNOSIS — R609 Edema, unspecified: Secondary | ICD-10-CM | POA: Diagnosis not present

## 2013-04-21 DIAGNOSIS — E876 Hypokalemia: Secondary | ICD-10-CM | POA: Diagnosis not present

## 2013-04-21 DIAGNOSIS — E119 Type 2 diabetes mellitus without complications: Secondary | ICD-10-CM | POA: Diagnosis not present

## 2013-04-22 ENCOUNTER — Encounter (INDEPENDENT_AMBULATORY_CARE_PROVIDER_SITE_OTHER): Payer: Self-pay

## 2013-04-22 ENCOUNTER — Ambulatory Visit (INDEPENDENT_AMBULATORY_CARE_PROVIDER_SITE_OTHER): Payer: Medicare Other | Admitting: Cardiovascular Disease

## 2013-04-22 ENCOUNTER — Encounter: Payer: Self-pay | Admitting: Cardiovascular Disease

## 2013-04-22 VITALS — BP 124/80 | HR 71 | Ht 64.5 in | Wt 173.1 lb

## 2013-04-22 DIAGNOSIS — E785 Hyperlipidemia, unspecified: Secondary | ICD-10-CM

## 2013-04-22 DIAGNOSIS — R0602 Shortness of breath: Secondary | ICD-10-CM

## 2013-04-22 NOTE — Patient Instructions (Addendum)
Your physician has requested that you have an echocardiogram. Echocardiography is a painless test that uses sound waves to create images of your heart. It provides your doctor with information about the size and shape of your heart and how well your heart's chambers and valves are working. This procedure takes approximately one hour. There are no restrictions for this procedure.  Your physician has requested that you have a stress echocardiogram. For further information please visit [email or web address]. Please follow instruction sheet as given.  You need a lab appointment on the same day as the first echocardiogram that is scheduled for BNP  Your physician recommends that you continue on your current medications as directed. Please refer to the Current Medication list given to you today.  Your physician wants you to follow-up in: 1 year with Dr. Burt Knack.  You will receive a reminder letter in the mail two months in advance. If you don't receive a letter, please call our office to schedule the follow-up appointment.

## 2013-04-22 NOTE — Progress Notes (Signed)
HPI:  73 year-old woman presenting for follow-up evaluation. Her last visit was November 2011. At that time she was evaluated for chest pain. A Myoview scan showed no significant ischemia.  Last lipids were checked and June 2013 with a cholesterol of 187, triglycerides 98, HDL 51, and LDL 117.  The patient complains of shortness of breath with activity. She's had occasional pain in the upper back between the shoulder blades. This has not been exertional. She denies orthopnea, PND, or leg swelling (as long as she's taking her diuretic). She's had no palpitations. There is a significant difference in her breathing this year compared to last year. She continues to stay active and works out with a trainer at least a few days per week.  Outpatient Encounter Prescriptions as of 04/22/2013  Medication Sig  . budesonide (ENTOCORT EC) 3 MG 24 hr capsule Take 2 po daily x 4 weeks then take one po daily x 4 weeks.  . chlorhexidine (PERIDEX) 0.12 % solution Use as directed 5 mLs in the mouth or throat as needed.   . diphenoxylate-atropine (LOMOTIL) 2.5-0.025 MG per tablet TAKE ONE TABLET FOUR TIMES DAILY AS NEEDED FOR DIARRHEA OR LOOSE STOOLS  . estradiol (ESTRACE) 0.5 MG tablet Take 1 tablet (0.5 mg total) by mouth daily. Take 0.5 mg by mouth daily.  Marland Kitchen glucose blood ([facility] BLOOD GLUCOSE TEST STRIPS) test strip Use as instructed to test 2 times daily  . metFORMIN (GLUCOPHAGE) 500 MG tablet Take 500 mg by mouth daily with breakfast.   . potassium chloride (KLOR-CON) 20 MEQ packet Take 20 mEq by mouth as needed. as needed.  . ranitidine (ZANTAC) 75 MG tablet Take 75 mg by mouth as needed.  Marland Kitchen spironolactone-hydrochlorothiazide (ALDACTAZIDE) 25-25 MG per tablet TAKE ONE TABLET EACH DAY  . Vitamin D, Ergocalciferol, (DRISDOL) 50000 UNITS CAPS Take 50,000 Units by mouth every 7 (seven) days.  . [DISCONTINUED] HYDROcodone-homatropine (HYCODAN) 5-1.5 MG/5ML syrup Take 5 mLs by mouth every 6 (six) hours as needed for  cough.  . [DISCONTINUED] magnesium oxide (MAG-OX) 400 MG tablet Take by mouth. Take 400 mg by mouth 2 (two) times daily.  . [DISCONTINUED] oseltamivir (TAMIFLU) 75 MG capsule Take 1 capsule (75 mg total) by mouth 2 (two) times daily.    Codeine phosphate and Morphine sulfate  Past Medical History  Diagnosis Date  . Hyperlipidemia   . Asthma   . Rosacea     opitcal  . Premature ventricular contractions   . Diverticulosis of colon (without mention of hemorrhage)   . Irritable bowel syndrome   . Other mucopurulent conjunctivitis   . Raynaud's syndrome   . Diabetes mellitus     managed by diet and exercise  . Spinal stenosis   . Anal fissure     as a child  . Osteoarthritis   . Lymphocytic colitis     chronic  . Fatty liver     Past Surgical History  Procedure Laterality Date  . Abdominal hysterectomy  1990  . Replacement total knee Bilateral 2005, 2007  . Lens implant    . Cholecystectomy    . Dental implants    . Cataract extraction Bilateral   . Foot surgery    . Hip surgery Bilateral 1973    History   Social History  . Marital Status: Married    Spouse Name: N/A    Number of Children: 2  . Years of Education: N/A   Occupational History  . [employer]  Social History Main Topics  . Smoking status: Former Smoker    Quit date: 10/22/1961  . Smokeless tobacco: Never Used  . Alcohol Use: No     Comment: alcohol makes her swell  . Drug Use: No  . Sexual Activity: Not on file   Other Topics Concern  . Not on file   Social History Narrative  . No narrative on file    Family History  Problem Relation Age of Onset  . Heart disease Father   . Diabetes Paternal Uncle   . Ovarian cancer Maternal Grandmother   . Lung cancer Sister   . Colon cancer Neg Hx   . Stomach cancer Neg Hx     ROS:  General: no fevers/chills/night sweats Eyes: no blurry vision, diplopia, or amaurosis ENT: no sore throat or hearing loss Resp: no cough, wheezing, or  hemoptysis CV: no palpitations. Positive for edema GI: no abdominal pain, nausea, vomiting, diarrhea, or constipation GU: no dysuria, frequency, or hematuria Skin: no rash Neuro: no headache, numbness, tingling, or weakness of extremities Musculoskeletal: Bilateral knee pain Heme: no bleeding, DVT, or easy bruising Endo: no polydipsia or polyuria  BP 124/80  Pulse 71  Ht 5' 4.5" (1.638 m)  Wt 173 lb 1.9 oz (78.527 kg)  BMI 29.27 kg/m2  PHYSICAL EXAM: Pt is alert and oriented, WD, WN, in no distress. HEENT: normal Neck: JVP normal. Carotid upstrokes normal without bruits. No thyromegaly. Lungs: equal expansion, clear bilaterally CV: Apex is discrete and nondisplaced, RRR without murmur or gallop, there is a widely split but physiologic S2 Abd: soft, NT, +BS, no bruit, no hepatosplenomegaly Back: no CVA tenderness Ext: no C/C/E        DP/PT pulses intact and = Skin: warm and dry without rash Neuro: CNII-XII intact             Strength intact = bilaterally  EKG:  Sinus rhythm 71 beats per minute, left anterior fascicular block  Myoview 03/08/2010: QPS  Raw Data Images: Normal; no motion artifact; normal heart/lung ratio.  Stress Images: There is normal uptake in all areas.  Rest Images: Normal homogeneous uptake in all areas of the myocardium.  Subtraction (SDS): No evidence of ischemia.  Transient Ischemic Dilatation: 0.95 (Normal <1.22)  Lung/Heart Ratio: 0.27 (Normal <0.45)  Quantitative Gated Spect Images  QGS EDV: 82 ml  QGS ESV: 29 ml  QGS EF: 65 %  QGS cine images: Normal LV systolic function.  Findings  Normal nuclear study  Overall Impression  Exercise Capacity: Adenosine study with no exercise.  BP Response: Normal blood pressure response.  Clinical Symptoms: No chest pain  ECG Impression: No significant ST segment change suggestive of ischemia.  Overall Impression: Normal stress nuclear study.  Overall Impression Comments: No ischemia. Normal LV systolic  function.  Appended Document: Cardiology Nuclear Testing  reviewed results with patient by telephone.      ASSESSMENT AND PLAN: 1. Dyspnea with exertion. Etiology is somewhat unclear. I recommended an echocardiogram to evaluate systolic and diastolic cardiac function. No obvious valvular disease based on her normal cardiac exam. Also will check an exercise echocardiogram to evaluate for an ischemic etiology. A BNP will be checked when she comes in for her other testing. The patient has no history of lung disease, nor does she have a smoking history.  2. Type 2 diabetes. She is taking metformin.  3. Edema. Has been responsive to spironolactone and hydrochlorothiazide.

## 2013-04-23 ENCOUNTER — Ambulatory Visit (INDEPENDENT_AMBULATORY_CARE_PROVIDER_SITE_OTHER): Payer: Medicare Other | Admitting: Podiatrist

## 2013-04-23 ENCOUNTER — Other Ambulatory Visit: Payer: Self-pay | Admitting: Internal Medicine

## 2013-04-23 ENCOUNTER — Encounter: Payer: Self-pay | Admitting: Podiatrist

## 2013-04-23 VITALS — BP 124/74 | HR 69 | Resp 17 | Ht 65.0 in | Wt 167.0 lb

## 2013-04-23 DIAGNOSIS — L84 Corns and callosities: Secondary | ICD-10-CM

## 2013-04-23 DIAGNOSIS — E1140 Type 2 diabetes mellitus with diabetic neuropathy, unspecified: Secondary | ICD-10-CM

## 2013-04-23 DIAGNOSIS — E1142 Type 2 diabetes mellitus with diabetic polyneuropathy: Secondary | ICD-10-CM | POA: Diagnosis not present

## 2013-04-23 DIAGNOSIS — E1149 Type 2 diabetes mellitus with other diabetic neurological complication: Secondary | ICD-10-CM

## 2013-04-23 MED ORDER — EFINACONAZOLE 10 % EX SOLN: 1.0000 [drp] | Freq: Every day | CUTANEOUS | Status: DC

## 2013-04-23 NOTE — Patient Instructions (Signed)
Your prescription has been sent to [facility] as it is the most affordable place to fill your prescription for this particular drug.  They will contact you to be sure you want them to fill the medication for you. If you haven't heard from them in 24 hours, give them a call.  Their phone number is [phone].  Their pharmacy hours are Mon-Thurs 8-7; Fri 8-6pm.    Diabetes and Foot Care Diabetes may cause you to have problems because of poor blood supply (circulation) to your feet and legs. This may cause the skin on your feet to become thinner, break easier, and heal more slowly. Your skin may become dry, and the skin may peel and crack. You may also have nerve damage in your legs and feet causing decreased feeling in them. You may not notice minor injuries to your feet that could lead to infections or more serious problems. Taking care of your feet is one of the most important things you can do for yourself.  HOME CARE INSTRUCTIONS  Wear shoes at all times, even in the house. Do not go barefoot. Bare feet are easily injured.  Check your feet daily for blisters, cuts, and redness. If you cannot see the bottom of your feet, use a mirror or ask someone for help.  Wash your feet with warm water (do not use hot water) and mild soap. Then pat your feet and the areas between your toes until they are completely dry. Do not soak your feet as this can dry your skin.  Apply a moisturizing lotion or petroleum jelly (that does not contain alcohol and is unscented) to the skin on your feet and to dry, brittle toenails. Do not apply lotion between your toes.  Trim your toenails straight across. Do not dig under them or around the cuticle. File the edges of your nails with an emery board or nail file.  Do not cut corns or calluses or try to remove them with medicine.  Wear clean socks or stockings every day. Make sure they are not too tight. Do not wear knee-high stockings since they may decrease  blood flow to your legs.  Wear shoes that fit properly and have enough cushioning. To break in new shoes, wear them for just a few hours a day. This prevents you from injuring your feet. Always look in your shoes before you put them on to be sure there are no objects inside.  Do not cross your legs. This may decrease the blood flow to your feet.  If you find a minor scrape, cut, or break in the skin on your feet, keep it and the skin around it clean and dry. These areas may be cleansed with mild soap and water. Do not cleanse the area with peroxide, alcohol, or iodine.  When you remove an adhesive bandage, be sure not to damage the skin around it.  If you have a wound, look at it several times a day to make sure it is healing.  Do not use heating pads or hot water bottles. They may burn your skin. If you have lost feeling in your feet or legs, you may not know it is happening until it is too late.  Make sure your health care provider performs a complete foot exam at least annually or more often if you have foot problems. Report any cuts, sores, or bruises to your health care provider immediately. SEEK MEDICAL CARE IF:   You have an injury that is  not healing.  You have cuts or breaks in the skin.  You have an ingrown nail.  You notice redness on your legs or feet.  You feel burning or tingling in your legs or feet.  You have pain or cramps in your legs and feet.  Your legs or feet are numb.  Your feet always feel cold. SEEK IMMEDIATE MEDICAL CARE IF:   There is increasing redness, swelling, or pain in or around a wound.  There is a red line that goes up your leg.  Pus is coming from a wound.  You develop a fever or as directed by your health care provider.  You notice a bad smell coming from an ulcer or wound. Document Released: 03/16/2000 Document Revised: 11/19/2012 Document Reviewed: 08/26/2012 [facility] Patient Information 2014 [employer].

## 2013-04-23 NOTE — Progress Notes (Signed)
   Subjective:    Patient ID: Brittney Fox, female    DOB: Apr 05, 1940, 73 y.o.   MRN: [id number]   Dodi presents today for trimming of fourth interspace kissing corns bilaterally and for a callus on the bottom of her left foot. She's also concerned about discoloration on the right great toenail and was to have it checked out. She is a well controlled type II diabetic and states that she's been doing well. She relates she's had numbness and tingling in her feet.and early neuropathic changes are noted.    HPI    Review of Systems  All other systems reviewed and are negative.       Objective:   Physical Exam Neurovascular status is intact and unchanged to bilateral feet with strong palpable pedal pulses at 2/4 DP and PT bilateral. Capillary refill time is within normal limits bilateral. Neurological sensation is intact epicriticaly and protectively bilateral via Semmes Weinstein monofilament at 5 out of 5 sites.Sunday Corn touch is also intact bilateral. Subjectively she complains of numbness and tingling to the distal digits in toes. She does  have interdigital corns on the lateral aspect of the left fourth toe and medial aspect of the left fifth toe that become painful and symptomatic. They are pre-ulcerative in nature. She also has a dark in discoloration of the right hallux nail which appears to be fungal. There is yellow and brown discoloration present.       Assessment & Plan:  Corn fourth interspace left, onychomycosis right hallux, diabetes with early neuropathic change  Plan: Debrided the corn on the left foot. Recommended a topical nail film on the right hallux. She will utilize this and if it fails to improve the appearance of the nail she will let me know we can consider other options.

## 2013-05-05 DIAGNOSIS — E876 Hypokalemia: Secondary | ICD-10-CM | POA: Diagnosis not present

## 2013-05-15 ENCOUNTER — Encounter: Payer: Self-pay | Admitting: Cardiovascular Disease

## 2013-05-19 ENCOUNTER — Other Ambulatory Visit: Payer: Medicare Other

## 2013-05-19 ENCOUNTER — Other Ambulatory Visit (HOSPITAL_COMMUNITY): Payer: Medicare Other

## 2013-05-20 ENCOUNTER — Other Ambulatory Visit (HOSPITAL_COMMUNITY): Payer: Medicare Other

## 2013-05-28 ENCOUNTER — Other Ambulatory Visit (HOSPITAL_COMMUNITY): Payer: Medicare Other

## 2013-05-28 ENCOUNTER — Other Ambulatory Visit: Payer: Medicare Other

## 2013-05-29 ENCOUNTER — Other Ambulatory Visit: Payer: Self-pay | Admitting: Internal Medicine

## 2013-06-05 ENCOUNTER — Encounter: Payer: Self-pay | Admitting: Cardiology

## 2013-06-05 ENCOUNTER — Ambulatory Visit (INDEPENDENT_AMBULATORY_CARE_PROVIDER_SITE_OTHER): Payer: Medicare Other | Admitting: *Deleted

## 2013-06-05 ENCOUNTER — Ambulatory Visit (HOSPITAL_COMMUNITY): Payer: Medicare Other | Attending: Cardiology

## 2013-06-05 DIAGNOSIS — E785 Hyperlipidemia, unspecified: Secondary | ICD-10-CM | POA: Diagnosis not present

## 2013-06-05 DIAGNOSIS — R0602 Shortness of breath: Secondary | ICD-10-CM | POA: Insufficient documentation

## 2013-06-05 LAB — BRAIN NATRIURETIC PEPTIDE: PRO B NATRI PEPTIDE: 40 pg/mL (ref 0.0–100.0)

## 2013-06-05 NOTE — Progress Notes (Signed)
Stress echo performed.

## 2013-06-12 ENCOUNTER — Ambulatory Visit (HOSPITAL_COMMUNITY): Payer: Medicare Other | Attending: Cardiovascular Disease | Admitting: Cardiology

## 2013-06-12 ENCOUNTER — Other Ambulatory Visit: Payer: Self-pay

## 2013-06-12 DIAGNOSIS — R0602 Shortness of breath: Secondary | ICD-10-CM | POA: Insufficient documentation

## 2013-06-12 NOTE — Progress Notes (Signed)
   Patient ID: Brittney Fox, female    DOB: 1940/11/04, 73 y.o.   MRN: [id number]  HPI    Review of Systems    Physical Exam

## 2013-06-12 NOTE — Progress Notes (Signed)
Echo completed

## 2013-06-17 ENCOUNTER — Ambulatory Visit (INDEPENDENT_AMBULATORY_CARE_PROVIDER_SITE_OTHER): Payer: Medicare Other | Admitting: Gynecology

## 2013-06-17 ENCOUNTER — Encounter: Payer: Self-pay | Admitting: Obstetrics & Gynecology

## 2013-06-17 ENCOUNTER — Telehealth: Payer: Self-pay | Admitting: Obstetrics & Gynecology

## 2013-06-17 ENCOUNTER — Encounter: Payer: Self-pay | Admitting: Gynecology

## 2013-06-17 VITALS — BP 129/81 | HR 73 | Temp 98.0°F | Resp 12 | Ht 64.5 in | Wt 173.0 lb

## 2013-06-17 DIAGNOSIS — R319 Hematuria, unspecified: Secondary | ICD-10-CM | POA: Diagnosis not present

## 2013-06-17 DIAGNOSIS — M4800 Spinal stenosis, site unspecified: Secondary | ICD-10-CM

## 2013-06-17 DIAGNOSIS — N949 Unspecified condition associated with female genital organs and menstrual cycle: Secondary | ICD-10-CM

## 2013-06-17 DIAGNOSIS — M8580 Other specified disorders of bone density and structure, unspecified site: Secondary | ICD-10-CM

## 2013-06-17 DIAGNOSIS — R102 Pelvic and perineal pain: Secondary | ICD-10-CM

## 2013-06-17 LAB — POCT URINALYSIS DIPSTICK
Urobilinogen, UA: NEGATIVE
pH, UA: 5

## 2013-06-17 MED ORDER — CIPROFLOXACIN HCL 500 MG PO TABS: 500.0000 mg | ORAL_TABLET | Freq: Two times a day (BID) | ORAL | Status: DC

## 2013-06-17 MED ORDER — PHENAZOPYRIDINE HCL 200 MG PO TABS: 200.0000 mg | ORAL_TABLET | Freq: Three times a day (TID) | ORAL | Status: DC | PRN

## 2013-06-17 NOTE — Telephone Encounter (Signed)
Spoke with patient. Patient reports increased pressure in pelvic area that began on Sunday. Patient states it "felt like cystitis." Patient increased water intake which she states helped to relieve pressure. Patient had a total hysterectomy in 1990. Upon using the restroom today patient states that the "toliet bowl was full of blood." Patient denies fevers and pain. Office visit scheduled for today at 11:30 with Dr. Charlies Constable. Advised patient to call back or seek immediate medical care if bleeding worsens. Patient verbalized understanding.  Patient scheduled today with Dr. Charlies Constable  CC Dr.Miller

## 2013-06-17 NOTE — Telephone Encounter (Signed)
Patient says she just used the restroom and a lot of blood was present.

## 2013-06-17 NOTE — Addendum Note (Signed)
Addended by: Alfonzo Feller on: 06/17/2013 02:48 PM   Modules accepted: Orders

## 2013-06-17 NOTE — Progress Notes (Signed)
Subjective:     Patient ID: Brittney Fox, female   DOB: Jul 26, 1940, 73 y.o.   MRN: [id number]  HPI Comments: Pt reports onset of dysuria since Sunday-4d, no fever, chills but recnet onset of cramping.  Now noticed frank blood.  Pt has a remote history of UTI, no histoyr of kidney stones.  Pelvic Pain The patient's primary symptoms include pelvic pain. This is a new problem. The current episode started in the past 7 days. The problem occurs constantly. The problem has been gradually worsening. The pain is moderate. The problem affects both sides. She is not pregnant. Associated symptoms include discolored urine, dysuria, frequency, hematuria and urgency. Pertinent negatives include no back pain, chills, fever, flank pain or vomiting. She is postmenopausal. Her past medical history is significant for a gynecological surgery.     Review of Systems  Constitutional: Negative for fever and chills.  Gastrointestinal: Negative for vomiting.  Genitourinary: Positive for dysuria, urgency, frequency, hematuria and pelvic pain. Negative for flank pain.  Musculoskeletal: Negative for back pain.       Objective:   Physical Exam  Constitutional: She is oriented to person, place, and time. She appears well-developed and well-nourished.  Abdominal: Soft. Normal appearance. She exhibits no distension. There is no CVA tenderness.  Genitourinary: Vagina normal. No vaginal discharge found.  Neurological: She is alert and oriented to person, place, and time.  Skin: Skin is warm and dry.       Assessment:     cystitis     Plan:     Pyridium and ciprofloxin Urine culture Increase fluids

## 2013-06-17 NOTE — Patient Instructions (Signed)
Urinary Tract Infection Urinary tract infections (UTIs) can develop anywhere along your urinary tract. Your urinary tract is your body's drainage system for removing wastes and extra water. Your urinary tract includes two kidneys, two ureters, a bladder, and a urethra. Your kidneys are a pair of bean-shaped organs. Each kidney is about the size of your fist. They are located below your ribs, one on each side of your spine. CAUSES Infections are caused by microbes, which are microscopic organisms, including fungi, viruses, and bacteria. These organisms are so small that they can only be seen through a microscope. Bacteria are the microbes that most commonly cause UTIs. SYMPTOMS  Symptoms of UTIs may vary by age and gender of the patient and by the location of the infection. Symptoms in young women typically include a frequent and intense urge to urinate and a painful, burning feeling in the bladder or urethra during urination. Older women and men are more likely to be tired, shaky, and weak and have muscle aches and abdominal pain. A fever may mean the infection is in your kidneys. Other symptoms of a kidney infection include pain in your back or sides below the ribs, nausea, and vomiting. DIAGNOSIS To diagnose a UTI, your caregiver will ask you about your symptoms. Your caregiver also will ask to provide a urine sample. The urine sample will be tested for bacteria and white blood cells. White blood cells are made by your body to help fight infection. TREATMENT  Typically, UTIs can be treated with medication. Because most UTIs are caused by a bacterial infection, they usually can be treated with the use of antibiotics. The choice of antibiotic and length of treatment depend on your symptoms and the type of bacteria causing your infection. HOME CARE INSTRUCTIONS  If you were prescribed antibiotics, take them exactly as your caregiver instructs you. Finish the medication even if you feel better after you  have only taken some of the medication.  Drink enough water and fluids to keep your urine clear or pale yellow.  Avoid caffeine, tea, and carbonated beverages. They tend to irritate your bladder.  Empty your bladder often. Avoid holding urine for long periods of time.  Empty your bladder before and after sexual intercourse.  After a bowel movement, women should cleanse from front to back. Use each tissue only once. SEEK MEDICAL CARE IF:   You have back pain.  You develop a fever.  Your symptoms do not begin to resolve within 3 days. SEEK IMMEDIATE MEDICAL CARE IF:   You have severe back pain or lower abdominal pain.  You develop chills.  You have nausea or vomiting.  You have continued burning or discomfort with urination. MAKE SURE YOU:   Understand these instructions.  Will watch your condition.  Will get help right away if you are not doing well or get worse. Document Released: 12/27/2004 Document Revised: 09/18/2011 Document Reviewed: 04/27/2011 [facility] Patient Information 2014 [place].  Increase fluids, avoid carbonation and try cranberry extract

## 2013-06-18 LAB — URINALYSIS, MICROSCOPIC ONLY
Bacteria, UA: NONE SEEN
CASTS: NONE SEEN
Crystals: NONE SEEN
RBC / HPF: >50 RBC/hpf — AB (ref <3)
Squamous Epithelial / LPF: NONE SEEN
WBC, UA: >50 WBC/hpf — AB (ref <3)

## 2013-06-19 LAB — URINE CULTURE

## 2013-06-22 MED ORDER — FLUCONAZOLE 150 MG PO TABS: 150.0000 mg | ORAL_TABLET | Freq: Once | ORAL | Status: DC

## 2013-06-22 NOTE — Addendum Note (Signed)
Addended by: Alfonzo Feller on: 06/22/2013 02:42 PM   Modules accepted: Orders

## 2013-07-02 DIAGNOSIS — Z23 Encounter for immunization: Secondary | ICD-10-CM | POA: Diagnosis not present

## 2013-07-02 DIAGNOSIS — R7309 Other abnormal glucose: Secondary | ICD-10-CM | POA: Diagnosis not present

## 2013-07-16 ENCOUNTER — Ambulatory Visit (INDEPENDENT_AMBULATORY_CARE_PROVIDER_SITE_OTHER): Payer: Medicare Other | Admitting: Podiatrist

## 2013-07-16 ENCOUNTER — Encounter: Payer: Self-pay | Admitting: Podiatrist

## 2013-07-16 VITALS — BP 111/72 | HR 60 | Resp 16

## 2013-07-16 DIAGNOSIS — E1149 Type 2 diabetes mellitus with other diabetic neurological complication: Secondary | ICD-10-CM | POA: Diagnosis not present

## 2013-07-16 DIAGNOSIS — L84 Corns and callosities: Secondary | ICD-10-CM

## 2013-07-16 DIAGNOSIS — E1140 Type 2 diabetes mellitus with diabetic neuropathy, unspecified: Secondary | ICD-10-CM

## 2013-07-16 DIAGNOSIS — M2040 Other hammer toe(s) (acquired), unspecified foot: Secondary | ICD-10-CM

## 2013-07-16 DIAGNOSIS — E1142 Type 2 diabetes mellitus with diabetic polyneuropathy: Secondary | ICD-10-CM

## 2013-07-16 NOTE — Progress Notes (Signed)
Brittney Fox presents today for trimming of fourth interspace kissing corns bilaterally. She is a well controlled type II diabetic and states that she's been doing well. She relates she's had numbness and tingling in her feet.and early neuropathic changes are noted. In the past we have tried her on Metanx however she is no longer on this medication.     Objective:   Physical Exam  Neurovascular status is intact and unchanged to bilateral feet with strong palpable pedal pulses at 2/4 DP and PT bilateral. Capillary refill time is within normal limits bilateral. Neurological sensation is intact epicriticaly and protectively bilateral via Semmes Weinstein monofilament at 5 out of 5 sites.Sunday Corn touch is also intact bilateral. Subjectively she complains of numbness and tingling to the distal digits in toes. She does have interdigital corns on the lateral aspect of the left fourth toe and medial aspect of the left fifth toe that become painful and symptomatic. They are pre-ulcerative in nature. She also has a dark in discoloration of the right hallux nail which appears to be fungal. There is yellow and brown discoloration present.  Assessment & Plan:   Corn fourth interspace left, onychomycosis right hallux, diabetes with early neuropathic change  Plan: Debrided the corn on the left foot.  Injected with dexamethasone and marcaine plain to help with the discomfort. She is going to her 50th college reunion soon. We spoke about a referral to Brittney Fox if the neuropathy is continuing to bother her.

## 2013-07-16 NOTE — Patient Instructions (Signed)
Dr Roxy Horseman ([employer])  Diabetic Neuropathy Diabetic neuropathy is a nerve disease or nerve damage that is caused by diabetes mellitus. About half of all people with diabetes mellitus have some form of nerve damage. Nerve damage is more common in those who have had diabetes mellitus for many years and who generally have not had good control of their blood sugar (glucose) level. Diabetic neuropathy is a common complication of diabetes mellitus. There are three more common types of diabetic neuropathy and a fourth type that is less common and less understood:   Peripheral neuropathy This is the most common type of diabetic neuropathy. It causes damage to the nerves of the feet and legs first and then eventually the hands and arms.The damage affects the ability to sense touch.  Autonomic neuropathy This type causes damage to the autonomic nervous system, which controls the following functions:  Heartbeat.  Body temperature.  Blood pressure.  Urination.  Digestion.  Sweating.  Sexual function.  Focal neuropathy Focal neuropathy can be painful and unpredictable and occurs most often in older adults with diabetes mellitus. It involves a specific nerve or one area and often comes on suddenly. It usually does not cause long-term problems.  Radiculoplexus neuropathy Sometimes called lumbosacral radiculoplexus neuropathy, radiculoplexus neuropathy affects the nerves of the thighs, hips, buttocks, or legs. It is more common in people with type 2 diabetes mellitus and in older men. It is characterized by debilitating pain, weakness, and atrophy, usually in the thigh muscles. CAUSES  The cause of peripheral, autonomic, and focal neuropathies is diabetes mellitus that is uncontrolled and high glucose levels. The cause of radiculoplexus neuropathy is unknown. However, it is thought to be caused by inflammation related to uncontrolled glucose levels. SIGNS AND SYMPTOMS  Peripheral  Neuropathy Peripheral neuropathy develops slowly over time. When the nerves of the feet and legs no longer work there may be:   Burning, stabbing, or aching pain in the legs or feet.  Inability to feel pressure or pain in your feet. This can lead to:  Thick calluses over pressure areas.  Pressure sores.  Ulcers.  Foot deformities.  Reduced ability to feel temperature changes.  Muscle weakness. Autonomic Neuropathy The symptoms of autonomic neuropathy vary depending on which nerves are affected. Symptoms may include:  Problems with digestion, such as:  Feeling sick to your stomach (nausea).  Vomiting.  Bloating.  Constipation.  Diarrhea.  Abdominal pain.  Difficulty with urination. This occurs if you lose your ability to sense when your bladder is full. Problems include:  Urine leakage (incontinence).  Inability to empty your bladder completely (retention).  Rapid or irregular heartbeat (palpitations).  Blood pressure drops when you stand up (orthostatic hypotension). When you stand up you may feel:  Dizzy.  Weak.  Faint.  In men, inability to attain and maintain an erection.  In women, vaginal dryness and problems with decreased sexual desire and arousal.  Problems with body temperature regulation.  Increased or decreased sweating. Focal Neuropathy  Abnormal eye movements or abnormal alignment of both eyes.  Weakness in the wrist.  Foot drop. This results in an inability to lift the foot properly and abnormal walking or foot movement.  Paralysis on one side of your face (Bell palsy).  Chest or abdominal pain. Radiculoplexus Neuropathy  Sudden, severe pain in your hip, thigh, or buttocks.  Weakness and wasting of thigh muscles.  Difficulty rising from a seated position.  Abdominal swelling.  Unexplained weight loss (usually more than 10 lb [  4.5 kg]). DIAGNOSIS  Peripheral Neuropathy Your senses may be tested. Sensory function testing  can be done with:  A light touch using a monofilament.  A vibration with tuning fork.  A sharp sensation with a pin prick. Other tests that can help diagnose neuropathy are:  Nerve conduction velocity. This test checks the transmission of an electrical current through a nerve.  Electromyography. This shows how muscles respond to electrical signals transmitted by nearby nerves.  Quantitative sensory testing. This is used to assess how your nerves respond to vibrations and changes in temperature. Autonomic Neuropathy Diagnosis is often based on reported symptoms. Tell your health care provider if you experience:   Dizziness.   Constipation.   Diarrhea.   Inappropriate urination or inability to urinate.   Inability to get or maintain an erection.  Tests that may be done include:   Electrocardiography or Holter monitor. These are tests that can help show problems with the heart rate or heart rhythm.   An X-ray exam may be done. Focal Neuropathy Diagnosis is made based on your symptoms and what your health care provider finds during your exam. Other tests may be done. They may include:  Nerve conduction velocities. This checks the transmission of electrical current through a nerve.  Electromyography. This shows how muscles respond to electrical signals transmitted by nearby nerves.  Quantitative sensory testing. This test is used to assess how your nerves respond to vibration and changes in temperature. Radiculoplexus Neuropathy  Often the first thing is to eliminate any other issue or problems that might be the cause, as there is no stick test for diagnosis.  X-ray exam of your spine and lumbar region.  Spinal tap to rule out cancer.  MRI to rule out other lesions. TREATMENT  Once nerve damage occurs, it cannot be reversed. The goal of treatment is to keep the disease or nerve damage from getting worse and affecting more nerve fibers. Controlling your blood glucose  level is the key. Most people with radiculoplexus neuropathy see at least a partial improvement over time. You will need to keep your blood glucose and HbA1c levels in the target range determined by your health care provider. Things that help control blood glucose levels include:   Blood glucose monitoring.   Meal planning.   Physical activity.   Diabetes medicine.  Over time, maintaining lower blood glucose levels helps lessen symptoms. Sometimes, prescription pain medicine is needed. HOME CARE INSTRUCTIONS:  Do not smoke.  Keep your blood glucose level in the range that you and your health care provider have determined acceptable for you.  Keep your blood pressure level in the range that you and your health care provider have determined acceptable for you.  Eat a well-balanced diet.  Be active every day.  Check your feet every day. SEEK MEDICAL CARE IF:   You have burning, stabbing, or aching pain in the legs or feet.  You are unable to feel pressure or pain in your feet.  You develop problems with digestion such as:  Nausea.  Vomiting.  Bloating.  Constipation.  Diarrhea.  Abdominal pain.  You have difficulty with urination, such as:  Incontinence.  Retention.  You have palpitations.  You develop orthostatic hypotension. When you stand up you may feel:  Dizzy.  Weak.  Faint.  You cannot attain and maintain an erection (in men).  You have vaginal dryness and problems with decreased sexual desire and arousal (in women).  You have severe pain in your thighs,  legs, or buttocks.  You have unexplained weight loss. Document Released: 05/28/2001 Document Revised: 01/07/2013 Document Reviewed: 08/28/2012 [facility] Patient Information 2014 [place].

## 2013-08-19 DIAGNOSIS — E876 Hypokalemia: Secondary | ICD-10-CM | POA: Diagnosis not present

## 2013-08-19 DIAGNOSIS — E119 Type 2 diabetes mellitus without complications: Secondary | ICD-10-CM | POA: Diagnosis not present

## 2013-08-26 DIAGNOSIS — E876 Hypokalemia: Secondary | ICD-10-CM | POA: Diagnosis not present

## 2013-08-26 DIAGNOSIS — R609 Edema, unspecified: Secondary | ICD-10-CM | POA: Diagnosis not present

## 2013-08-26 DIAGNOSIS — E119 Type 2 diabetes mellitus without complications: Secondary | ICD-10-CM | POA: Diagnosis not present

## 2013-08-26 DIAGNOSIS — I83893 Varicose veins of bilateral lower extremities with other complications: Secondary | ICD-10-CM | POA: Diagnosis not present

## 2013-09-02 DIAGNOSIS — E559 Vitamin D deficiency, unspecified: Secondary | ICD-10-CM | POA: Diagnosis not present

## 2013-09-02 DIAGNOSIS — E876 Hypokalemia: Secondary | ICD-10-CM | POA: Diagnosis not present

## 2013-09-02 DIAGNOSIS — R609 Edema, unspecified: Secondary | ICD-10-CM | POA: Diagnosis not present

## 2013-09-02 DIAGNOSIS — Z801 Family history of malignant neoplasm of trachea, bronchus and lung: Secondary | ICD-10-CM | POA: Diagnosis not present

## 2013-09-02 DIAGNOSIS — R7309 Other abnormal glucose: Secondary | ICD-10-CM | POA: Diagnosis not present

## 2013-09-02 DIAGNOSIS — K589 Irritable bowel syndrome without diarrhea: Secondary | ICD-10-CM | POA: Diagnosis not present

## 2013-09-02 DIAGNOSIS — Z1331 Encounter for screening for depression: Secondary | ICD-10-CM | POA: Diagnosis not present

## 2013-09-02 DIAGNOSIS — M48061 Spinal stenosis, lumbar region without neurogenic claudication: Secondary | ICD-10-CM | POA: Diagnosis not present

## 2013-09-17 DIAGNOSIS — I8310 Varicose veins of unspecified lower extremity with inflammation: Secondary | ICD-10-CM | POA: Diagnosis not present

## 2013-09-29 ENCOUNTER — Telehealth: Payer: Self-pay | Admitting: *Deleted

## 2013-09-29 DIAGNOSIS — I8310 Varicose veins of unspecified lower extremity with inflammation: Secondary | ICD-10-CM | POA: Diagnosis not present

## 2013-09-29 DIAGNOSIS — R609 Edema, unspecified: Secondary | ICD-10-CM | POA: Diagnosis not present

## 2013-09-29 DIAGNOSIS — E559 Vitamin D deficiency, unspecified: Secondary | ICD-10-CM | POA: Diagnosis not present

## 2013-09-29 DIAGNOSIS — E876 Hypokalemia: Secondary | ICD-10-CM | POA: Diagnosis not present

## 2013-09-29 DIAGNOSIS — Z0000 Encounter for general adult medical examination without abnormal findings: Secondary | ICD-10-CM | POA: Diagnosis not present

## 2013-09-29 DIAGNOSIS — M79609 Pain in unspecified limb: Secondary | ICD-10-CM | POA: Diagnosis not present

## 2013-09-29 NOTE — Telephone Encounter (Signed)
Patient will come in for a fasting Lipid panel - FYI.

## 2013-09-30 DIAGNOSIS — R7309 Other abnormal glucose: Secondary | ICD-10-CM | POA: Diagnosis not present

## 2013-09-30 DIAGNOSIS — E119 Type 2 diabetes mellitus without complications: Secondary | ICD-10-CM | POA: Diagnosis not present

## 2013-09-30 LAB — HEMOGLOBIN A1C: HEMOGLOBIN A1C: 6.1 % — AB (ref 4.0–6.0)

## 2013-10-01 DIAGNOSIS — I8310 Varicose veins of unspecified lower extremity with inflammation: Secondary | ICD-10-CM | POA: Diagnosis not present

## 2013-10-06 DIAGNOSIS — K589 Irritable bowel syndrome without diarrhea: Secondary | ICD-10-CM | POA: Diagnosis not present

## 2013-10-06 DIAGNOSIS — Z801 Family history of malignant neoplasm of trachea, bronchus and lung: Secondary | ICD-10-CM | POA: Diagnosis not present

## 2013-10-06 DIAGNOSIS — E559 Vitamin D deficiency, unspecified: Secondary | ICD-10-CM | POA: Diagnosis not present

## 2013-10-06 DIAGNOSIS — Z6829 Body mass index (BMI) 29.0-29.9, adult: Secondary | ICD-10-CM | POA: Diagnosis not present

## 2013-10-06 DIAGNOSIS — E876 Hypokalemia: Secondary | ICD-10-CM | POA: Diagnosis not present

## 2013-10-06 DIAGNOSIS — E119 Type 2 diabetes mellitus without complications: Secondary | ICD-10-CM | POA: Diagnosis not present

## 2013-10-06 DIAGNOSIS — R609 Edema, unspecified: Secondary | ICD-10-CM | POA: Diagnosis not present

## 2013-10-06 DIAGNOSIS — Z0000 Encounter for general adult medical examination without abnormal findings: Secondary | ICD-10-CM | POA: Diagnosis not present

## 2013-10-13 DIAGNOSIS — Z1212 Encounter for screening for malignant neoplasm of rectum: Secondary | ICD-10-CM | POA: Diagnosis not present

## 2013-11-20 ENCOUNTER — Other Ambulatory Visit: Payer: Self-pay

## 2013-11-20 DIAGNOSIS — S92919A Unspecified fracture of unspecified toe(s), initial encounter for closed fracture: Secondary | ICD-10-CM | POA: Diagnosis not present

## 2013-11-20 DIAGNOSIS — Z1231 Encounter for screening mammogram for malignant neoplasm of breast: Secondary | ICD-10-CM

## 2013-11-30 ENCOUNTER — Encounter: Payer: Self-pay | Admitting: Internal Medicine

## 2013-12-14 ENCOUNTER — Other Ambulatory Visit: Payer: Self-pay | Admitting: Internal Medicine

## 2013-12-15 ENCOUNTER — Ambulatory Visit (INDEPENDENT_AMBULATORY_CARE_PROVIDER_SITE_OTHER): Payer: Medicare Other | Admitting: Obstetrics & Gynecology

## 2013-12-15 ENCOUNTER — Encounter: Payer: Self-pay | Admitting: Obstetrics & Gynecology

## 2013-12-15 VITALS — BP 130/80 | HR 64 | Temp 97.8°F | Resp 16 | Ht 64.0 in | Wt 176.2 lb

## 2013-12-15 DIAGNOSIS — Z124 Encounter for screening for malignant neoplasm of cervix: Secondary | ICD-10-CM

## 2013-12-15 DIAGNOSIS — Z01419 Encounter for gynecological examination (general) (routine) without abnormal findings: Secondary | ICD-10-CM | POA: Diagnosis not present

## 2013-12-15 DIAGNOSIS — N390 Urinary tract infection, site not specified: Secondary | ICD-10-CM

## 2013-12-15 LAB — POCT URINALYSIS DIPSTICK
Bilirubin, UA: NEGATIVE
GLUCOSE UA: NEGATIVE
Ketones, UA: NEGATIVE
LEUKOCYTES UA: NEGATIVE
NITRITE UA: NEGATIVE
Protein, UA: NEGATIVE
UROBILINOGEN UA: NEGATIVE
pH, UA: 5

## 2013-12-15 MED ORDER — ESTRADIOL 0.5 MG PO TABS: 0.5000 mg | ORAL_TABLET | Freq: Every day | ORAL | Status: DC

## 2013-12-15 MED ORDER — NITROFURANTOIN MONOHYD MACRO 100 MG PO CAPS: 100.0000 mg | ORAL_CAPSULE | Freq: Two times a day (BID) | ORAL | Status: DC

## 2013-12-15 NOTE — Progress Notes (Signed)
73 y.o. [id number] MarriedCaucasianF here for annual exam.  Having back pain for past two days and some increased dysuria.  Pt thinks she has a UTI.  She has had several this past year.  No VB.    Had significant colitis last year with Dr. Olevia Perches.  Has finally gotten over that.  Seeing Dr. Jackelyn Poling at [facility].    Having GI issues with metformin.  Off this for now.  She has an endocrinologist at [facility] in November.  Has appt with Dr. Chalmers Cater as well.    Reports new issues with short term memory in husband. He was an avid athlete in his younger years.  Played football in college as linebacker.  H/o head injuries.  At [facility].  He is fighting this all the way but pt taking on many more responsibilities at home.  Patient's last menstrual period was 04/02/1988.          Sexually active: No.  The current method of family planning is post menopausal status.    Exercising: Yes.    weights and walking Smoker:  Former smoker years ago  Health Maintenance: Pap:  11/29/10 WNL History of abnormal Pap:  no MMG:  12/31/12-normal Colonoscopy:  12/13-repeat in 10 years BMD:   6/09 TDaP:  2013 Screening Labs: PCP, Hb today: PCP, Urine today: RBC-trace   reports that she quit smoking about 52 years ago. She has never used smokeless tobacco. She reports that she drinks alcohol. She reports that she does not use illicit drugs.  Past Medical History  Diagnosis Date  . Hyperlipidemia   . Asthma   . Rosacea     opitcal  . Premature ventricular contractions   . Diverticulosis of colon (without mention of hemorrhage)   . Irritable bowel syndrome   . Other mucopurulent conjunctivitis   . Raynaud's syndrome   . Diabetes mellitus     managed by diet and exercise  . Spinal stenosis   . Anal fissure     as a child  . Osteoarthritis   . Lymphocytic colitis     chronic  . Fatty liver   . Osteopenia 09/2007    Past Surgical History  Procedure Laterality Date  . Replacement total knee Bilateral 2005, 2007   . Lens implant    . Cholecystectomy    . Dental implants    . Cataract extraction Bilateral   . Foot surgery    . Hip surgery Bilateral 1973  . Dilation and curettage of uterus  1990  . Ovarian wedge resection  1964  . Total abdominal hysterectomy w/ bilateral salpingoophorectomy  1990    Excessive Bleeding   . Knee arthroscopy Right   . Total knee arthroplasty Left 08/2003  . Total knee arthroplasty Right 08/2005  . Jaw implants  2007    Current Outpatient Prescriptions  Medication Sig Dispense Refill  . chlorhexidine (PERIDEX) 0.12 % solution Use as directed 5 mLs in the mouth or throat as needed.       . diphenoxylate-atropine (LOMOTIL) 2.5-0.025 MG per tablet TAKE ONE (1) TABLET FOUR TIMES EACH DAY AS NEEDED FOR DIARRHEA  30 tablet  0  . Efinaconazole 10 % SOLN Apply 1 drop topically daily.      Marland Kitchen estradiol (ESTRACE) 0.5 MG tablet Take 1 tablet (0.5 mg total) by mouth daily. Take 0.5 mg by mouth daily.  30 tablet  13  . fluconazole (DIFLUCAN) 150 MG tablet Take 1 tablet (150 mg total) by mouth once.  1  tablet  0  . glucose blood ([employer] BLOOD GLUCOSE TEST STRIPS) test strip Use as instructed to test 2 times daily      . glucose blood ([employer] BLOOD GLUCOSE TEST STRIPS) test strip Use as instructed to test 2 times daily      . potassium chloride (KLOR-CON) 20 MEQ packet Take 20 mEq by mouth as needed. as needed.      . ranitidine (ZANTAC) 75 MG tablet Take 75 mg by mouth as needed.      Marland Kitchen spironolactone-hydrochlorothiazide (ALDACTAZIDE) 25-25 MG per tablet TAKE ONE TABLET EACH DAY  30 tablet  5  . amoxicillin (AMOXIL) 500 MG capsule       . ciprofloxacin (CIPRO) 500 MG tablet Take 1 tablet (500 mg total) by mouth 2 (two) times daily.  14 tablet  0  . metFORMIN (GLUCOPHAGE) 500 MG tablet Take 500 mg by mouth daily with breakfast.       . metFORMIN (GLUCOPHAGE) 500 MG tablet Take by mouth.       No current facility-administered medications for this visit.    Family History  Problem  Relation Age of Onset  . Heart disease Father   . Diabetes Paternal Uncle   . Ovarian cancer Maternal Grandmother   . Lung cancer Sister   . Colon cancer Neg Hx   . Stomach cancer Neg Hx   . Breast cancer Sister 68  . Osteoporosis Mother     ROS:  Pertinent items are noted in HPI.  Otherwise, a comprehensive ROS was negative.  Exam:   BP 130/80  Pulse 64  Temp(Src) 97.8 F (36.6 C) (Oral)  Resp 16  Ht 5\' 4"  (1.626 m)  Wt 176 lb 3.2 oz (79.924 kg)  BMI 30.23 kg/m2  LMP 04/02/1988   Height: 5\' 4"  (162.6 cm)  Ht Readings from Last 3 Encounters:  12/15/13 5\' 4"  (1.626 m)  06/17/13 5' 4.5" (1.638 m)  04/23/13 5\' 5"  (1.651 m)    General appearance: alert, cooperative and appears stated age Head: Normocephalic, without obvious abnormality, atraumatic Neck: no adenopathy, supple, symmetrical, trachea midline and thyroid normal to inspection and palpation Lungs: clear to auscultation bilaterally Breasts: normal appearance, no masses or tenderness Heart: regular rate and rhythm Abdomen: soft, non-tender; bowel sounds normal; no masses,  no organomegaly Extremities: extremities normal, atraumatic, no cyanosis or edema Skin: Skin color, texture, turgor normal. No rashes or lesions Lymph nodes: Cervical, supraclavicular, and axillary nodes normal. No abnormal inguinal nodes palpated Neurologic: Grossly normal   Pelvic: External genitalia:  no lesions              Urethra:  normal appearing urethra with no masses, tenderness or lesions              Bartholins and Skenes: normal                 Vagina: normal appearing vagina with normal color and discharge, no lesions              Cervix: absent              Pap taken: Yes.   Bimanual Exam:  Uterus:  uterus absent              Adnexa: no mass, fullness, tenderness               Rectovaginal: Confirms               Anus:  normal sphincter tone, no  lesions  A:  Well Woman with normal exam PMP, on low dose HRT.  Aware of risks  of breast cancer, DVT, stroke, MI and desires to continue. Family hx of ovarian cancer H/O osteoporosis Insomnia Possible UTI Stressors with husband and memory issues  P:   Mammogram yearly. Estradiol 0.5mg  daily pap smear obtained today Urine culture pending.  macrobid 100mg  bid x 7 days.  Pt will be called with results. return annually or prn  An After Visit Summary was printed and given to the patient.

## 2013-12-17 LAB — URINE CULTURE: Colony Count: 15000

## 2013-12-17 LAB — IPS PAP SMEAR ONLY

## 2013-12-22 DIAGNOSIS — S8290XD Unspecified fracture of unspecified lower leg, subsequent encounter for closed fracture with routine healing: Secondary | ICD-10-CM | POA: Diagnosis not present

## 2013-12-22 DIAGNOSIS — Z96659 Presence of unspecified artificial knee joint: Secondary | ICD-10-CM | POA: Diagnosis not present

## 2014-01-04 ENCOUNTER — Ambulatory Visit
Admission: RE | Admit: 2014-01-04 | Discharge: 2014-01-04 | Disposition: A | Discharge status: Home / Self Care | Payer: Medicare Other | Source: Ambulatory Visit

## 2014-01-04 DIAGNOSIS — Z1231 Encounter for screening mammogram for malignant neoplasm of breast: Secondary | ICD-10-CM | POA: Diagnosis not present

## 2014-01-08 DIAGNOSIS — M549 Dorsalgia, unspecified: Secondary | ICD-10-CM | POA: Diagnosis not present

## 2014-01-13 DIAGNOSIS — Z23 Encounter for immunization: Secondary | ICD-10-CM | POA: Diagnosis not present

## 2014-01-28 DIAGNOSIS — Z961 Presence of intraocular lens: Secondary | ICD-10-CM | POA: Diagnosis not present

## 2014-01-28 DIAGNOSIS — E119 Type 2 diabetes mellitus without complications: Secondary | ICD-10-CM | POA: Diagnosis not present

## 2014-02-01 ENCOUNTER — Encounter: Payer: Self-pay | Admitting: Obstetrics & Gynecology

## 2014-02-04 ENCOUNTER — Other Ambulatory Visit: Payer: Self-pay | Admitting: Internal Medicine

## 2014-02-04 DIAGNOSIS — R7309 Other abnormal glucose: Secondary | ICD-10-CM | POA: Diagnosis not present

## 2014-04-27 ENCOUNTER — Encounter: Payer: Self-pay | Admitting: Cardiovascular Disease

## 2014-04-27 ENCOUNTER — Ambulatory Visit (INDEPENDENT_AMBULATORY_CARE_PROVIDER_SITE_OTHER): Payer: Medicare Other | Admitting: Cardiovascular Disease

## 2014-04-27 VITALS — BP 130/74 | HR 72 | Ht 65.0 in | Wt 177.0 lb

## 2014-04-27 DIAGNOSIS — R0602 Shortness of breath: Secondary | ICD-10-CM | POA: Diagnosis not present

## 2014-04-27 DIAGNOSIS — E785 Hyperlipidemia, unspecified: Secondary | ICD-10-CM | POA: Diagnosis not present

## 2014-04-27 NOTE — Progress Notes (Signed)
Cardiology Office Note Date:  04/27/2014   ID:  Brittney Fox, DOB 04-02-1941, MRN [id number]  PCP:  No PCP Per Patient  Cardiologist:  [name], MD    Chief Complaint  Patient presents with  . Shortness of Breath     History of Present Illness: Brittney Fox is a 74 y.o. female who presents for follow-up evaluation. She's been evaluated in the past for chest pain. A Myoview scan showed no significant ischemia.  The patient is doing well without symptoms of chest pain, chest pressure, or shortness of breath at present. She feels well today and denies edema, orthopnea, PND, or heart palpitations. When I saw her one year ago she did complain of shortness of breath. She underwent extensive evaluation with a 2-D echocardiogram and an exercise echocardiogram. The studies are outlined below and they were unremarkable.  Past Medical History  Diagnosis Date  . Hyperlipidemia   . Asthma   . Rosacea     opitcal  . Premature ventricular contractions   . Diverticulosis of colon (without mention of hemorrhage)   . Irritable bowel syndrome   . Other mucopurulent conjunctivitis   . Raynaud's syndrome   . Diabetes mellitus     managed by diet and exercise  . Spinal stenosis   . Anal fissure     as a child  . Osteoarthritis   . Lymphocytic colitis     chronic  . Fatty liver   . Osteopenia 09/2007    Past Surgical History  Procedure Laterality Date  . Replacement total knee Bilateral 2005, 2007  . Lens implant    . Cholecystectomy    . Dental implants    . Cataract extraction Bilateral   . Foot surgery    . Hip surgery Bilateral 1973  . Dilation and curettage of uterus  1990  . Ovarian wedge resection  1964  . Total abdominal hysterectomy w/ bilateral salpingoophorectomy  1990    Excessive Bleeding   . Knee arthroscopy Right   . Total knee arthroplasty Left 08/2003  . Total knee arthroplasty Right 08/2005  . Jaw implants  2007    Current Outpatient Prescriptions    Medication Sig Dispense Refill  . amoxicillin (AMOXIL) 500 MG capsule     . chlorhexidine (PERIDEX) 0.12 % solution Use as directed 5 mLs in the mouth or throat as needed.     . diphenoxylate-atropine (LOMOTIL) 2.5-0.025 MG per tablet TAKE ONE (1) TABLET FOUR TIMES EACH DAY AS NEEDED FOR DIARRHEA 30 tablet 0  . Efinaconazole 10 % SOLN Apply 1 drop topically daily.    Marland Kitchen estradiol (ESTRACE) 0.5 MG tablet Take 1 tablet (0.5 mg total) by mouth daily. Take 0.5 mg by mouth daily. 30 tablet 13  . fluconazole (DIFLUCAN) 150 MG tablet Take 1 tablet (150 mg total) by mouth once. 1 tablet 0  . glucose blood ([facility] BLOOD GLUCOSE TEST STRIPS) test strip Use as instructed to test 2 times daily    . glucose blood ([employer]S BLOOD GLUCOSE TEST STRIPS) test strip Use as instructed to test 2 times daily    . potassium chloride (KLOR-CON) 20 MEQ packet Take 20 mEq by mouth as needed. as needed.    . Probiotic Product (PROBIOTIC DAILY PO) Take 1 tablet by mouth daily.    . ranitidine (ZANTAC) 75 MG tablet Take 75 mg by mouth as needed.    Marland Kitchen spironolactone-hydrochlorothiazide (ALDACTAZIDE) 25-25 MG per tablet TAKE ONE TABLET EACH DAY 30 tablet 5  No current facility-administered medications for this visit.    Allergies:   Codeine phosphate; Morphine sulfate; and Penicillins   Social History:  The patient  reports that she quit smoking about 52 years ago. She has never used smokeless tobacco. She reports that she drinks alcohol. She reports that she does not use illicit drugs.   Family History:  The patient's family history includes Breast cancer (age of onset: 18) in her sister; Diabetes in her paternal uncle; Heart disease in her father; Lung cancer in her sister; Osteoporosis in her mother; Ovarian cancer in her maternal grandmother. There is no history of Colon cancer or Stomach cancer.    ROS:  Please see the history of present illness.  Otherwise, review of systems is positive for anxiety and headaches.  All  other systems are reviewed and negative.    PHYSICAL EXAM: VS:  BP 130/74 mmHg  Pulse 72  Ht 5\' 5"  (1.651 m)  Wt 177 lb (80.287 kg)  BMI 29.45 kg/m2  LMP 04/02/1988 , BMI Body mass index is 29.45 kg/(m^2). GEN: Well nourished, well developed, in no acute distress HEENT: normal Neck: no JVD, carotid bruits, or masses Cardiac: RRR; no murmurs, rubs, or gallops,no edema  Respiratory:  clear to auscultation bilaterally, normal work of breathing GI: soft, nontender, nondistended, + BS MS: no deformity or atrophy Skin: warm and dry, no rash Neuro:  Strength and sensation are intact Psych: euthymic mood, full affect  EKG:  EKG is ordered today. The ekg ordered today shows NSR with LAFB, no change from tracing last year.  Recent Labs: 06/05/2013: Pro B Natriuretic peptide (BNP) 40.0   Lipid Panel     Component Value Date/Time   CHOL 187 09/04/2011 0928   TRIG 98.0 09/04/2011 0928   TRIG 88 02/06/2006 1028   HDL 50.80 09/04/2011 0928   CHOLHDL 4 09/04/2011 0928   VLDL 19.6 09/04/2011 0928   LDLCALC 117* 09/04/2011 0928   LDLDIRECT 150.2 11/28/2010 1131      Wt Readings from Last 3 Encounters:  04/27/14 177 lb (80.287 kg)  12/15/13 176 lb 3.2 oz (79.924 kg)  06/17/13 173 lb (78.472 kg)     Other studies Reviewed: Myoview 03/08/2010: QPS  Raw Data Images: Normal; no motion artifact; normal heart/lung ratio.  Stress Images: There is normal uptake in all areas.  Rest Images: Normal homogeneous uptake in all areas of the myocardium.  Subtraction (SDS): No evidence of ischemia.  Transient Ischemic Dilatation: 0.95 (Normal <1.22)  Lung/Heart Ratio: 0.27 (Normal <0.45)  Quantitative Gated Spect Images  QGS EDV: 82 ml  QGS ESV: 29 ml  QGS EF: 65 %  QGS cine images: Normal LV systolic function.  Findings  Normal nuclear study  Overall Impression  Exercise Capacity: Adenosine study with no exercise.  BP Response: Normal blood pressure response.  Clinical  Symptoms: No chest pain  ECG Impression: No significant ST segment change suggestive of ischemia.  Overall Impression: Normal stress nuclear study.  Overall Impression Comments: No ischemia. Normal LV systolic function.  Appended Document: Cardiology Nuclear Testing  reviewed results with patient by telephone.   2D Echo 06/12/2013: Study Conclusions  - Left ventricle: The cavity size was normal. There was mild concentric hypertrophy. Systolic function was normal. The estimated ejection fraction was in the range of 55% to 60%. Wall motion was normal; there were no regional wall motion abnormalities. Left ventricular diastolic function parameters were normal. There was no evidence of elevated ventricular filling pressure by Doppler parameters. -  Aortic valve: Trileaflet; normal thickness leaflets. No regurgitation. - Aortic root: The aortic root was normal in size. - Mitral valve: Trivial regurgitation. - Left atrium: The atrium was normal in size. - Right ventricle: The cavity size was mildly dilated. Wall thickness was normal. - Right atrium: The atrium was mildly dilated. - Tricuspid valve: Mild-moderate regurgitation. - Pulmonic valve: Transvalvular velocity was within the normal range. There was no evidence for stenosis. Trivial regurgitation. - Pulmonary arteries: Systolic pressure was mildly increased. PA peak pressure: 43mm Hg (S). - Inferior vena cava: The vessel was dilated; the respirophasic diameter changes were blunted (< 50%); findings are consistent with elevated central venous pressure. - Pericardium, extracardiac: A trivial pericardial effusion was identified. - Impressions: Normal left ventricular systolic and diastolic function. Mildly dilated right ventricle with preserved function and mildly elevated pressure. (RVSP 46 mmHg) but normal left ventricular filling pressure suggestive of pulmonary etiology of shortness  of breath. . Impressions:  - Normal left ventricular systolic and diastolic function. Mildly dilated right ventricle with preserved function and mildly elevated pressure. (RVSP 46 mmHg) but normal left ventricular filling pressure suggestive of pulmonary etiology of shortness of breath.   06/05/2013: Study Conclusions - Stress ECG conclusions: Bigeminal PVC's were noted throughout the exercise treadmill with a 6 beat run of wide complex tachycardia There were no stress arrhythmias or conduction abnormalities. The stress ECG was negative for ischemia. Duke scoring: exercise time of 82min; maximum ST deviation of 42mm; no angina; resulting score is 5. This score predicts a moderate risk of cardiac events. - Staged echo: Normal echo stress Impressions:  - Normal study after maximal exercise.  ASSESSMENT AND PLAN: 1.  Shortness of breath with exertion. Symptoms are minimal. Last year's evaluation as above without evidence of ischemia or LV dysfunction. Lung fields are clear on exam of the patient has no history of tobacco use. Discussed initiation of an exercise program.  2. Type 2 diabetes. The patient was taking metformin but has GI intolerance to this. She is working on diet and exercise.  Current medicines are reviewed with the patient today.  The patient does not have concerns regarding medicines.  The following changes have been made:  no change  Labs/ tests ordered today include: none    Orders Placed This Encounter  Procedures  . EKG 12-Lead    Disposition:   FU with me in 12 months  Signed, [name], MD  04/27/2014 6:25 PM    [place], [place], [place]  [address] Phone: [phone]; Fax: [phone]

## 2014-04-27 NOTE — Patient Instructions (Signed)
Your physician wants you to follow-up in: 1 YEAR with Dr Cooper.  You will receive a reminder letter in the mail two months in advance. If you don't receive a letter, please call our office to schedule the follow-up appointment.  Your physician recommends that you continue on your current medications as directed. Please refer to the Current Medication list given to you today.  

## 2014-05-20 ENCOUNTER — Encounter: Payer: Self-pay | Admitting: Podiatrist

## 2014-05-20 ENCOUNTER — Ambulatory Visit (INDEPENDENT_AMBULATORY_CARE_PROVIDER_SITE_OTHER): Payer: Medicare Other | Admitting: Podiatrist

## 2014-05-20 DIAGNOSIS — L84 Corns and callosities: Secondary | ICD-10-CM | POA: Diagnosis not present

## 2014-05-20 DIAGNOSIS — G629 Polyneuropathy, unspecified: Secondary | ICD-10-CM

## 2014-05-23 NOTE — Progress Notes (Signed)
Brittney Fox presents today for trimming of fourth interspace kissing corns bilaterally. She is a well controlled type II diabetic and states that she's been doing well. She relates she's had numbness and tingling in her feet.and early neuropathic changes are noted. In the past we have tried her on Metanx however she is no longer on this medication- she relates she has not had any worsening of her symptoms.     Objective:   Physical Exam  Neurovascular status is intact and unchanged to bilateral feet with strong palpable pedal pulses at 2/4 DP and PT bilateral. Capillary refill time is within normal limits bilateral. Neurological sensation is intact epicriticaly and protectively bilateral via Semmes Weinstein monofilament at 5 out of 5 sites.Sunday Corn touch is also intact bilateral. Subjectively she complains of numbness and tingling to the distal digits in toes. She does have interdigital corns on the lateral aspect of the left fourth toe and medial aspect of the left fifth toe that become painful and symptomatic. They are pre-ulcerative in nature. She also has a dark in discoloration of the right hallux nail which appears to be fungal. There is yellow and brown discoloration present.  Assessment & Plan:   Corn fourth interspace left, onychomycosis right hallux, diabetes with early neuropathic change  Plan: Debrided the corn on the left foot. wll consider referral to Dr. Ace Gins if the neuropathy is continuing to bother her.

## 2014-07-22 DIAGNOSIS — R300 Dysuria: Secondary | ICD-10-CM | POA: Diagnosis not present

## 2014-08-04 ENCOUNTER — Ambulatory Visit (INDEPENDENT_AMBULATORY_CARE_PROVIDER_SITE_OTHER): Payer: Medicare Other | Admitting: Podiatrist

## 2014-08-04 ENCOUNTER — Encounter: Payer: Self-pay | Admitting: Podiatrist

## 2014-08-04 VITALS — BP 112/72 | HR 72 | Resp 14

## 2014-08-04 DIAGNOSIS — L84 Corns and callosities: Secondary | ICD-10-CM | POA: Diagnosis not present

## 2014-08-04 DIAGNOSIS — M2042 Other hammer toe(s) (acquired), left foot: Secondary | ICD-10-CM

## 2014-08-04 DIAGNOSIS — E1140 Type 2 diabetes mellitus with diabetic neuropathy, unspecified: Secondary | ICD-10-CM

## 2014-08-04 DIAGNOSIS — G629 Polyneuropathy, unspecified: Secondary | ICD-10-CM

## 2014-08-05 NOTE — Progress Notes (Signed)
Chief Complaint  Patient presents with  . Callouses    "trim left  5th toe, right 4th toe," can't wear close in shoes"    Brittney Fox presents today for trimming of fourth interspace kissing corns bilaterally. She is a well controlled type II diabetic and states that she's been doing well. She relates she's had numbness and tingling in her feet.and early neuropathic changes are noted. In the past we have tried her on Metanx however she is no longer on this medication- she relates she has not had any worsening of her symptoms.     Objective:   Physical Exam  Neurovascular status is intact and unchanged to bilateral feet with strong palpable pedal pulses at 2/4 DP and PT bilateral. Capillary refill time is within normal limits bilateral. Neurological sensation is intact epicriticaly and protectively bilateral via Semmes Weinstein monofilament at 5 out of 5 sites.Sunday Corn touch is also intact bilateral. Subjectively she complains of numbness and tingling to the distal digits in toes. She does have interdigital corns on the lateral aspect of the left fourth toe and medial aspect of the left fifth toe that become painful and symptomatic. They are pre-ulcerative in nature. She also has a dark in discoloration of the right hallux nail which appears to be fungal. There is yellow and brown discoloration present.  Assessment & Plan:   Corn fourth interspace left, onychomycosis right hallux, diabetes with early neuropathic change  Plan: Debrided the corn on the left foot.

## 2014-09-02 DIAGNOSIS — R7309 Other abnormal glucose: Secondary | ICD-10-CM | POA: Diagnosis not present

## 2014-10-06 DIAGNOSIS — E119 Type 2 diabetes mellitus without complications: Secondary | ICD-10-CM | POA: Diagnosis not present

## 2014-10-06 DIAGNOSIS — E559 Vitamin D deficiency, unspecified: Secondary | ICD-10-CM | POA: Diagnosis not present

## 2014-10-12 DIAGNOSIS — R197 Diarrhea, unspecified: Secondary | ICD-10-CM | POA: Diagnosis not present

## 2014-10-12 DIAGNOSIS — E119 Type 2 diabetes mellitus without complications: Secondary | ICD-10-CM | POA: Diagnosis not present

## 2014-10-12 DIAGNOSIS — Z1212 Encounter for screening for malignant neoplasm of rectum: Secondary | ICD-10-CM | POA: Diagnosis not present

## 2014-10-12 DIAGNOSIS — E559 Vitamin D deficiency, unspecified: Secondary | ICD-10-CM | POA: Diagnosis not present

## 2014-10-12 DIAGNOSIS — E876 Hypokalemia: Secondary | ICD-10-CM | POA: Diagnosis not present

## 2014-10-12 DIAGNOSIS — L84 Corns and callosities: Secondary | ICD-10-CM | POA: Diagnosis not present

## 2014-10-12 DIAGNOSIS — Z23 Encounter for immunization: Secondary | ICD-10-CM | POA: Diagnosis not present

## 2014-10-12 DIAGNOSIS — Z1389 Encounter for screening for other disorder: Secondary | ICD-10-CM | POA: Diagnosis not present

## 2014-10-12 DIAGNOSIS — K589 Irritable bowel syndrome without diarrhea: Secondary | ICD-10-CM | POA: Diagnosis not present

## 2014-10-12 DIAGNOSIS — Z0000 Encounter for general adult medical examination without abnormal findings: Secondary | ICD-10-CM | POA: Diagnosis not present

## 2014-10-12 DIAGNOSIS — Z6829 Body mass index (BMI) 29.0-29.9, adult: Secondary | ICD-10-CM | POA: Diagnosis not present

## 2014-10-12 DIAGNOSIS — M2042 Other hammer toe(s) (acquired), left foot: Secondary | ICD-10-CM | POA: Diagnosis not present

## 2014-10-12 DIAGNOSIS — M2041 Other hammer toe(s) (acquired), right foot: Secondary | ICD-10-CM | POA: Diagnosis not present

## 2014-10-12 DIAGNOSIS — Z801 Family history of malignant neoplasm of trachea, bronchus and lung: Secondary | ICD-10-CM | POA: Diagnosis not present

## 2014-10-12 DIAGNOSIS — R600 Localized edema: Secondary | ICD-10-CM | POA: Diagnosis not present

## 2014-11-08 DIAGNOSIS — L821 Other seborrheic keratosis: Secondary | ICD-10-CM | POA: Diagnosis not present

## 2014-11-08 DIAGNOSIS — L814 Other melanin hyperpigmentation: Secondary | ICD-10-CM | POA: Diagnosis not present

## 2014-11-08 DIAGNOSIS — L812 Freckles: Secondary | ICD-10-CM | POA: Diagnosis not present

## 2014-11-08 DIAGNOSIS — L570 Actinic keratosis: Secondary | ICD-10-CM | POA: Diagnosis not present

## 2014-11-22 ENCOUNTER — Ambulatory Visit (INDEPENDENT_AMBULATORY_CARE_PROVIDER_SITE_OTHER): Payer: Medicare Other | Admitting: Podiatry

## 2014-11-22 ENCOUNTER — Encounter: Payer: Self-pay | Admitting: Podiatry

## 2014-11-22 DIAGNOSIS — L84 Corns and callosities: Secondary | ICD-10-CM | POA: Diagnosis not present

## 2014-11-22 DIAGNOSIS — M779 Enthesopathy, unspecified: Secondary | ICD-10-CM

## 2014-11-22 MED ORDER — TRIAMCINOLONE ACETONIDE 10 MG/ML IJ SUSP
10.0000 mg | Freq: Once | INTRAMUSCULAR | Status: AC
  Administered 2014-11-22: 10 mg

## 2014-11-23 NOTE — Progress Notes (Signed)
Subjective:     Patient ID: Brittney Fox, female   DOB: 02-22-1941, 74 y.o.   MRN: [id number]  HPI patient presents with lesion on the fourth toe left foot over right foot with inflammation and fluid at the interphalangeal joint of both toes lateral side and pain when palpated   Review of Systems     Objective:   Physical Exam Inflammatory capsulitis fourth digit bilateral interphalangeal joint with fluid buildup noted    Assessment:     Reviewed inflammatory capsulitis and at this time did careful discussion of condition    Plan:     Reviewed surgery and Joycelyn Schmid continue to try conservative. I went ahead and I injected the interphalangeal joint 1 mg dexamethasone 1 mg Kenalog 2 mg Xylocaine and debrided lesion left and applied padding. Reappoint to recheck

## 2014-12-08 DIAGNOSIS — Z23 Encounter for immunization: Secondary | ICD-10-CM | POA: Diagnosis not present

## 2014-12-16 ENCOUNTER — Other Ambulatory Visit: Payer: Self-pay

## 2014-12-16 DIAGNOSIS — Z1231 Encounter for screening mammogram for malignant neoplasm of breast: Secondary | ICD-10-CM

## 2014-12-30 ENCOUNTER — Other Ambulatory Visit: Payer: Self-pay | Admitting: *Deleted

## 2014-12-30 MED ORDER — ESTRADIOL 0.5 MG PO TABS: 0.5000 mg | ORAL_TABLET | Freq: Every day | ORAL | Status: DC

## 2014-12-30 NOTE — Telephone Encounter (Signed)
Medication refill request: Estradiol 0.5 mg Last AEX:  12/15/13 SM Next AEX: ? Last MMG (if hormonal medication request): 01/04/14 BIRADS1:Neg. Has appt scheduled 01/17/15  Refill authorized: 12/15/13 #30/ 13 R.   Called patient scheduled AEX 02/22/15 @2 :45pm.  Routed to Dr. Quincy Simmonds.

## 2015-01-17 ENCOUNTER — Ambulatory Visit
Admission: RE | Admit: 2015-01-17 | Discharge: 2015-01-17 | Disposition: A | Discharge status: Home / Self Care | Payer: Medicare Other | Source: Ambulatory Visit

## 2015-01-17 DIAGNOSIS — Z1231 Encounter for screening mammogram for malignant neoplasm of breast: Secondary | ICD-10-CM

## 2015-01-25 ENCOUNTER — Encounter: Payer: Self-pay | Admitting: Obstetrics & Gynecology

## 2015-01-25 ENCOUNTER — Ambulatory Visit (INDEPENDENT_AMBULATORY_CARE_PROVIDER_SITE_OTHER): Payer: Medicare Other | Admitting: Obstetrics & Gynecology

## 2015-01-25 VITALS — BP 114/70 | HR 72 | Resp 16 | Ht 64.25 in | Wt 174.0 lb

## 2015-01-25 DIAGNOSIS — Z0000 Encounter for general adult medical examination without abnormal findings: Secondary | ICD-10-CM | POA: Diagnosis not present

## 2015-01-25 DIAGNOSIS — Z01419 Encounter for gynecological examination (general) (routine) without abnormal findings: Secondary | ICD-10-CM

## 2015-01-25 LAB — POCT URINALYSIS DIPSTICK
BILIRUBIN UA: NEGATIVE
GLUCOSE UA: NEGATIVE
KETONES UA: NEGATIVE
Leukocytes, UA: NEGATIVE
NITRITE UA: NEGATIVE
Protein, UA: NEGATIVE
RBC UA: NEGATIVE
Urobilinogen, UA: NEGATIVE
pH, UA: 5

## 2015-01-25 MED ORDER — ESTRADIOL 0.5 MG PO TABS: 0.5000 mg | ORAL_TABLET | Freq: Every day | ORAL | Status: DC

## 2015-01-25 NOTE — Progress Notes (Signed)
74 y.o. [id number] MarriedCaucasianF here for annual exam.  Doing well.  Pt biggest issue is some neuropathy in her feet.  Seeing a podiatrist.  Denies vaginal bleeding.  PCP:  Dr. Jackelyn Poling.  Last appt was in June.  Seeing him every six months.    Patient's last menstrual period was 04/02/1988.          Sexually active: No.  The current method of family planning is status post hysterectomy.    Exercising: Yes.    walking Smoker:  Former smoker  Health Maintenance: Pap:  12/15/13 WNL History of abnormal Pap:  no MMG:  01/17/15 3D-BiRads 1 negative Colonoscopy:  2013-repeat 10 years BMD: 2014-very mild osteopenia in spine, normal hips    TDaP: 2013 Screening Labs: PCP, Hb today: PCP, Urine today: negative   reports that she quit smoking about 53 years ago. She has never used smokeless tobacco. She reports that she does not drink alcohol or use illicit drugs.  Past Medical History  Diagnosis Date  . Hyperlipidemia   . Asthma   . Rosacea     opitcal  . Premature ventricular contractions   . Diverticulosis of colon (without mention of hemorrhage)   . Irritable bowel syndrome   . Other mucopurulent conjunctivitis   . Raynaud's syndrome   . Diabetes mellitus     managed by diet and exercise  . Spinal stenosis   . Anal fissure     as a child  . Osteoarthritis   . Lymphocytic colitis     chronic  . Fatty liver   . Osteopenia 09/2007    Past Surgical History  Procedure Laterality Date  . Replacement total knee Bilateral 2005, 2007  . Lens implant    . Cholecystectomy    . Dental implants    . Cataract extraction Bilateral   . Foot surgery    . Hip surgery Bilateral 1973  . Dilation and curettage of uterus  1990  . Ovarian wedge resection  1964  . Total abdominal hysterectomy w/ bilateral salpingoophorectomy  1990    Excessive Bleeding   . Knee arthroscopy Right   . Total knee arthroplasty Left 08/2003  . Total knee arthroplasty Right 08/2005  . Jaw implants  2007     Current Outpatient Prescriptions  Medication Sig Dispense Refill  . acetaminophen (TYLENOL) 650 MG CR tablet Take 650 mg by mouth every 8 (eight) hours as needed for pain.    . chlorhexidine (PERIDEX) 0.12 % solution Use as directed 5 mLs in the mouth or throat as needed.     . diphenoxylate-atropine (LOMOTIL) 2.5-0.025 MG per tablet TAKE ONE (1) TABLET FOUR TIMES EACH DAY AS NEEDED FOR DIARRHEA 30 tablet 0  . Efinaconazole 10 % SOLN Apply 1 drop topically daily.    Marland Kitchen estradiol (ESTRACE) 0.5 MG tablet Take 1 tablet (0.5 mg total) by mouth daily. Take 0.5 mg by mouth daily. 30 tablet 1  . glucose blood ([facility] BLOOD GLUCOSE TEST STRIPS) test strip Use as instructed to test 2 times daily    . potassium chloride (KLOR-CON) 20 MEQ packet Take 20 mEq by mouth as needed. as needed.    . Probiotic Product (PROBIOTIC DAILY PO) Take 1 tablet by mouth daily.    . ranitidine (ZANTAC) 75 MG tablet Take 75 mg by mouth as needed.    Marland Kitchen spironolactone-hydrochlorothiazide (ALDACTAZIDE) 25-25 MG per tablet TAKE ONE TABLET EACH DAY 30 tablet 5  . amoxicillin (AMOXIL) 500 MG capsule  No current facility-administered medications for this visit.    Family History  Problem Relation Age of Onset  . Heart disease Father   . Diabetes Paternal Uncle   . Ovarian cancer Maternal Grandmother   . Lung cancer Sister   . Colon cancer Neg Hx   . Stomach cancer Neg Hx   . Breast cancer Sister 72  . Osteoporosis Mother     ROS:  Pertinent items are noted in HPI.  Otherwise, a comprehensive ROS was negative.  Exam:   BP 114/70 mmHg  Pulse 72  Resp 16  Ht 5' 4.25" (1.632 m)  Wt 174 lb (78.926 kg)  BMI 29.63 kg/m2  LMP 04/02/1988     Height: 5' 4.25" (163.2 cm)  Ht Readings from Last 3 Encounters:  01/25/15 5' 4.25" (1.632 m)  04/27/14 5\' 5"  (1.651 m)  12/15/13 5\' 4"  (1.626 m)    General appearance: alert, cooperative and appears stated age Head: Normocephalic, without obvious abnormality,  atraumatic Neck: no adenopathy, supple, symmetrical, trachea midline and thyroid normal to inspection and palpation Lungs: clear to auscultation bilaterally Breasts: normal appearance, no masses or tenderness Heart: regular rate and rhythm Abdomen: soft, non-tender; bowel sounds normal; no masses,  no organomegaly Extremities: extremities normal, atraumatic, no cyanosis or edema Skin: Skin color, texture, turgor normal. No rashes or lesions Lymph nodes: Cervical, supraclavicular, and axillary nodes normal. No abnormal inguinal nodes palpated Neurologic: Grossly normal   Pelvic: External genitalia:  no lesions              Urethra:  normal appearing urethra with no masses, tenderness or lesions              Bartholins and Skenes: normal                 Vagina: normal appearing vagina with normal color and discharge, no lesions              Cervix: absent              Pap taken: No. Bimanual Exam:  Uterus:  uterus absent              Adnexa: normal adnexa and no mass, fullness, tenderness               Rectovaginal: Confirms               Anus:  normal sphincter tone, no lesions  Chaperone was present for exam.  A:  Well Woman with normal exam PMP, on low dose HRT.  Family hx of ovarian cancer H/O osteoporosis Insomnia Recurrent UTIs.  Negative urine today. Social stressors--husband with memory issues.  Moved to [place] this past year.    P: Mammogram yearly Estradiol 0.5mg  daily.  #30/13 RF.  Aware of risks of breast cancer, DVT, stroke, MI and continues to desire  Pap smear last year.  No pap today. return annually or prn

## 2015-01-27 DIAGNOSIS — E119 Type 2 diabetes mellitus without complications: Secondary | ICD-10-CM | POA: Diagnosis not present

## 2015-01-27 DIAGNOSIS — R7303 Prediabetes: Secondary | ICD-10-CM | POA: Diagnosis not present

## 2015-02-22 ENCOUNTER — Ambulatory Visit: Payer: Medicare Other | Admitting: Obstetrics & Gynecology

## 2015-03-17 DIAGNOSIS — E119 Type 2 diabetes mellitus without complications: Secondary | ICD-10-CM | POA: Diagnosis not present

## 2015-03-17 DIAGNOSIS — Z961 Presence of intraocular lens: Secondary | ICD-10-CM | POA: Diagnosis not present

## 2015-04-15 DIAGNOSIS — T310 Burns involving less than 10% of body surface: Secondary | ICD-10-CM | POA: Diagnosis not present

## 2015-04-15 DIAGNOSIS — E784 Other hyperlipidemia: Secondary | ICD-10-CM | POA: Diagnosis not present

## 2015-04-15 DIAGNOSIS — E119 Type 2 diabetes mellitus without complications: Secondary | ICD-10-CM | POA: Diagnosis not present

## 2015-04-15 DIAGNOSIS — R600 Localized edema: Secondary | ICD-10-CM | POA: Diagnosis not present

## 2015-04-15 DIAGNOSIS — Z6829 Body mass index (BMI) 29.0-29.9, adult: Secondary | ICD-10-CM | POA: Diagnosis not present

## 2015-04-27 DIAGNOSIS — Z6829 Body mass index (BMI) 29.0-29.9, adult: Secondary | ICD-10-CM | POA: Diagnosis not present

## 2015-04-27 DIAGNOSIS — E785 Hyperlipidemia, unspecified: Secondary | ICD-10-CM | POA: Diagnosis not present

## 2015-04-27 DIAGNOSIS — E119 Type 2 diabetes mellitus without complications: Secondary | ICD-10-CM | POA: Diagnosis not present

## 2015-06-03 ENCOUNTER — Encounter: Payer: Self-pay | Admitting: Cardiovascular Disease

## 2015-06-03 ENCOUNTER — Ambulatory Visit (INDEPENDENT_AMBULATORY_CARE_PROVIDER_SITE_OTHER): Payer: Medicare Other | Admitting: Cardiovascular Disease

## 2015-06-03 VITALS — BP 128/74 | HR 67 | Ht 64.75 in | Wt 172.1 lb

## 2015-06-03 DIAGNOSIS — E785 Hyperlipidemia, unspecified: Secondary | ICD-10-CM | POA: Diagnosis not present

## 2015-06-03 NOTE — Progress Notes (Signed)
Cardiology Office Note Date:  06/03/2015   ID:  Brittney Fox, DOB 15-May-1940, MRN [id number]  PCP:  Brittney Hatchet, MD  Cardiologist:  Brittney Mocha, MD    No chief complaint on file.    History of Present Illness: Brittney Fox is a 75 y.o. female who presents for follow-up evaluation. She's had a history of chest pain and exertional dyspnea. She's had nuclear stress testing in the past and a straining no significant ischemia. In 2015 she presented with aggressive shortness of breath and had an echocardiogram as well as an exercise echocardiogram done. The study showed no significant abnormalities. A BNP was normal. She has felt to have noncardiac symptoms.  Overall the patient is doing well. She denies any progression of her shortness of breath. She denies orthopnea, PND, or recurrent chest pain. She's had no lightheadedness or syncope. 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   Past Medical History  Diagnosis Date  . Hyperlipidemia   . Asthma   . Rosacea     opitcal  . Premature ventricular contractions   . Diverticulosis of colon (without mention of hemorrhage)   . Irritable bowel syndrome   . Other mucopurulent conjunctivitis   . Raynaud's syndrome   . Diabetes mellitus     managed by diet and exercise  . Spinal stenosis   . Anal fissure     as a child  . Osteoarthritis   . Lymphocytic colitis     chronic  . Fatty liver   . Osteopenia 09/2007    Past Surgical History  Procedure Laterality Date  . Replacement total knee Bilateral 2005, 2007  . Lens implant    . Cholecystectomy    . Dental implants    . Cataract extraction Bilateral   . Foot surgery    . Hip surgery Bilateral 1973  . Dilation and curettage of uterus  1990  . Ovarian wedge resection  1964  . Total abdominal hysterectomy w/ bilateral salpingoophorectomy  1990    Excessive Bleeding   . Knee arthroscopy Right   . Total knee arthroplasty Left 08/2003  . Total knee arthroplasty Right  08/2005  . Jaw implants  2007    Current Outpatient Prescriptions  Medication Sig Dispense Refill  . acetaminophen (TYLENOL) 650 MG CR tablet Take 650 mg by mouth every 8 (eight) hours as needed for pain.    Marland Kitchen amoxicillin (AMOXIL) 500 MG capsule     . chlorhexidine (PERIDEX) 0.12 % solution Use as directed 5 mLs in the mouth or throat as needed.     . diphenoxylate-atropine (LOMOTIL) 2.5-0.025 MG per tablet TAKE ONE (1) TABLET FOUR TIMES EACH DAY AS NEEDED FOR DIARRHEA 30 tablet 0  . Efinaconazole 10 % SOLN Apply 1 drop topically daily.    Marland Kitchen estradiol (ESTRACE) 0.5 MG tablet Take 1 tablet (0.5 mg total) by mouth daily. Take 0.5 mg by mouth daily. 30 tablet 13  . glucose blood ([facility] BLOOD GLUCOSE TEST STRIPS) test strip Use as instructed to test 2 times daily    . potassium chloride (KLOR-CON) 20 MEQ packet Take 20 mEq by mouth as needed. as needed.    . Probiotic Product (PROBIOTIC DAILY PO) Take 1 tablet by mouth daily.    . ranitidine (ZANTAC) 75 MG tablet Take 75 mg by mouth as needed.    Marland Kitchen spironolactone-hydrochlorothiazide (ALDACTAZIDE) 25-25 MG per tablet TAKE ONE TABLET EACH DAY 30 tablet 5   No current facility-administered medications for this visit.  Allergies:   Codeine phosphate; Morphine sulfate; and Penicillins   Social History:  The patient  reports that she quit smoking about 53 years ago. She has never used smokeless tobacco. She reports that she does not drink alcohol or use illicit drugs.   Family History:  The patient's  family history includes Breast cancer (age of onset: 29) in her sister; Diabetes in her paternal uncle; Heart disease in her father; Lung cancer in her sister; Osteoporosis in her mother; Ovarian cancer in her maternal grandmother. There is no history of Colon cancer or Stomach cancer.    ROS:  Please see the history of present illness.  Otherwise, review of systems is positive for leg swelling, mild shortness of breath with activity, anxiety,  headaches.  All other systems are reviewed and negative.    PHYSICAL EXAM: VS:  BP 128/74 mmHg  Pulse 67  Ht 5' 4.75" (1.645 m)  Wt 78.073 kg (172 lb 1.9 oz)  BMI 28.85 kg/m2  LMP 04/02/1988 , BMI Body mass index is 28.85 kg/(m^2). GEN: Well nourished, well developed, in no acute distress HEENT: normal Neck: no JVD, no masses. No carotid bruits Cardiac: RRR without murmur or gallop                Respiratory:  clear to auscultation bilaterally, normal work of breathing GI: soft, nontender, nondistended, + BS MS: no deformity or atrophy Ext: trace pretibial edema, pedal pulses 2+= bilaterally Skin: warm and dry, no rash Neuro:  Strength and sensation are intact Psych: euthymic mood, full affect  EKG:  EKG is ordered today. The ekg ordered today shows normal sinus rhythm 67 bpm, nonspecific T wave abnormality, left axis deviation  Recent Labs: No results found for requested labs within last 365 days.   Lipid Panel     Component Value Date/Time   CHOL 187 09/04/2011 0928   TRIG 98.0 09/04/2011 0928   TRIG 88 02/06/2006 1028   HDL 50.80 09/04/2011 0928   CHOLHDL 4 09/04/2011 0928   CHOLHDL 4.7 CALC 02/06/2006 1028   VLDL 19.6 09/04/2011 0928   LDLCALC 117* 09/04/2011 0928   LDLDIRECT 150.2 11/28/2010 1131      Wt Readings from Last 3 Encounters:  06/03/15 78.073 kg (172 lb 1.9 oz)  01/25/15 78.926 kg (174 lb)  04/27/14 80.287 kg (177 lb)     Cardiac Studies Reviewed: Stress Echo 06/05/2013: Study Conclusions  - Stress ECG conclusions: Bigeminal PVC's were noted throughout the exercise treadmill with a 6 beat run of wide complex tachycardia There were no stress arrhythmias or conduction abnormalities. The stress ECG was negative for ischemia. [facility] scoring: exercise time of 45min; maximum ST deviation of 61mm; no angina; resulting score is 5. This score predicts a moderate risk of cardiac events. - Staged echo: Normal echo stress Impressions:  - Normal  study after maximal exercise.  ASSESSMENT AND PLAN: 1.  Hyperlipidemia: LDL has been greater than 100 in the past. She's been diagnosed with type 2 diabetes. Will update a lipid panel with consideration toward starting a statin drug at low dose. She has not been inclined to take a statin drug but has received similar counseling from her endocrinologist at [facility].  2. Shortness of breath: Stable and not particularly lifestyle limiting. Previous cardiac evaluation reviewed and negative.  Current medicines are reviewed with the patient today.  The patient does not have concerns regarding medicines.  Labs/ tests ordered today include:  No orders of the defined types were placed in  this encounter.    Disposition:   FU one year  Signed, Brittney Mocha, MD  06/03/2015 9:08 AM    [facility] [address], [place], [place]  [address] Phone: [phone]; Fax: [phone]

## 2015-06-03 NOTE — Patient Instructions (Signed)
Medication Instructions:  Your physician recommends that you continue on your current medications as directed. Please refer to the Current Medication list given to you today.  Labwork: Your physician recommends that you return for a FASTING LIPID and CMP--nothing to eat or drink after midnight, lab opens at 7:30 AM  Testing/Procedures: No new orders.   Follow-Up: Your physician wants you to follow-up in: 1 YEAR with Dr Cooper.  You will receive a reminder letter in the mail two months in advance. If you don't receive a letter, please call our office to schedule the follow-up appointment.   Any Other Special Instructions Will Be Listed Below (If Applicable).     If you need a refill on your cardiac medications before your next appointment, please call your pharmacy.   

## 2015-06-09 ENCOUNTER — Other Ambulatory Visit (INDEPENDENT_AMBULATORY_CARE_PROVIDER_SITE_OTHER): Payer: Medicare Other | Admitting: *Deleted

## 2015-06-09 DIAGNOSIS — E785 Hyperlipidemia, unspecified: Secondary | ICD-10-CM

## 2015-06-09 LAB — LIPID PANEL
CHOLESTEROL: 207 mg/dL — AB (ref 125–200)
HDL: 45 mg/dL — ABNORMAL LOW (ref >=46)
LDL Cholesterol: 137 mg/dL — ABNORMAL HIGH (ref <130)
TRIGLYCERIDES: 123 mg/dL (ref <150)
Total CHOL/HDL Ratio: 4.6 Ratio (ref <=5.0)
VLDL: 25 mg/dL (ref <30)

## 2015-06-09 LAB — COMPREHENSIVE METABOLIC PANEL
ALK PHOS: 58 U/L (ref 33–130)
ALT: 19 U/L (ref 6–29)
AST: 17 U/L (ref 10–35)
Albumin: 4.3 g/dL (ref 3.6–5.1)
BILIRUBIN TOTAL: 1 mg/dL (ref 0.2–1.2)
BUN: 11 mg/dL (ref 7–25)
CALCIUM: 9.5 mg/dL (ref 8.6–10.4)
CO2: 30 mmol/L (ref 20–31)
Chloride: 96 mmol/L — ABNORMAL LOW (ref 98–110)
Creat: 0.58 mg/dL — ABNORMAL LOW (ref 0.60–0.93)
Glucose, Bld: 127 mg/dL — ABNORMAL HIGH (ref 65–99)
POTASSIUM: 3.6 mmol/L (ref 3.5–5.3)
Sodium: 136 mmol/L (ref 135–146)
TOTAL PROTEIN: 7.1 g/dL (ref 6.1–8.1)

## 2015-06-13 ENCOUNTER — Telehealth: Payer: Self-pay | Admitting: *Deleted

## 2015-06-13 ENCOUNTER — Other Ambulatory Visit: Payer: Self-pay

## 2015-06-13 DIAGNOSIS — E785 Hyperlipidemia, unspecified: Secondary | ICD-10-CM

## 2015-06-13 MED ORDER — ATORVASTATIN CALCIUM 10 MG PO TABS: 10.0000 mg | ORAL_TABLET | Freq: Every day | ORAL | Status: DC

## 2015-06-13 MED ORDER — POTASSIUM CHLORIDE CRYS ER 10 MEQ PO TBCR: 20.0000 meq | EXTENDED_RELEASE_TABLET | ORAL | Status: DC

## 2015-06-13 MED ORDER — POTASSIUM CHLORIDE 20 MEQ PO PACK: 20.0000 meq | PACK | ORAL | Status: DC | PRN

## 2015-06-13 NOTE — Telephone Encounter (Signed)
Patient called to get the results of her recent labs. She also was asking about an rx for potassium powder. She can be reached at [phone].

## 2015-06-13 NOTE — Telephone Encounter (Signed)
I spoke with the pt and made her aware of lab results.   Notes Recorded by [name], MD on 06/11/2015 at 5:28 PM Lipids reviewed. With diabetes, recommended LDL < 100 mg/dL. Agree she should be started on a low-dose statin, atorvastatin 10 mg daily  Rx sent to the pharmacy and the pt will have repeat lipid and liver in 3 months.

## 2015-07-04 ENCOUNTER — Encounter: Payer: Self-pay | Admitting: Podiatry

## 2015-07-04 ENCOUNTER — Ambulatory Visit (INDEPENDENT_AMBULATORY_CARE_PROVIDER_SITE_OTHER): Payer: Medicare Other | Admitting: Podiatry

## 2015-07-04 DIAGNOSIS — M779 Enthesopathy, unspecified: Secondary | ICD-10-CM | POA: Diagnosis not present

## 2015-07-04 DIAGNOSIS — L84 Corns and callosities: Secondary | ICD-10-CM | POA: Diagnosis not present

## 2015-07-04 MED ORDER — TRIAMCINOLONE ACETONIDE 10 MG/ML IJ SUSP
10.0000 mg | Freq: Once | INTRAMUSCULAR | Status: AC
  Administered 2015-07-04: 10 mg

## 2015-07-06 NOTE — Progress Notes (Signed)
Subjective:     Patient ID: Brittney Fox, female   DOB: 1941-04-02, 75 y.o.   MRN: [id number]  HPI patient presents with lesion and pain fourth digit of the left foot with fluid at the interphalangeal joint and keratotic lesion formation. Patient also has small lesion on the right fifth   Review of Systems     Objective:   Physical Exam Neurovascular status unchanged with inflammatory changes around the interphalangeal joint fourth toe left foot with keratotic lesion formation and pain    Assessment:     Inflammatory capsulitis digit 4 left with fluid buildup and lesion formation    Plan:     Reviewed condition and did careful interphalangeal joint injection fourth digit left and debris did lesion with no iatrogenic bleeding noted. Applied padding

## 2015-07-18 DIAGNOSIS — R51 Headache: Secondary | ICD-10-CM | POA: Diagnosis not present

## 2015-07-18 DIAGNOSIS — Z6829 Body mass index (BMI) 29.0-29.9, adult: Secondary | ICD-10-CM | POA: Diagnosis not present

## 2015-07-18 DIAGNOSIS — E119 Type 2 diabetes mellitus without complications: Secondary | ICD-10-CM | POA: Diagnosis not present

## 2015-07-18 DIAGNOSIS — E784 Other hyperlipidemia: Secondary | ICD-10-CM | POA: Diagnosis not present

## 2015-08-11 DIAGNOSIS — E119 Type 2 diabetes mellitus without complications: Secondary | ICD-10-CM | POA: Diagnosis not present

## 2015-08-24 DIAGNOSIS — M25572 Pain in left ankle and joints of left foot: Secondary | ICD-10-CM | POA: Diagnosis not present

## 2015-09-13 ENCOUNTER — Other Ambulatory Visit (INDEPENDENT_AMBULATORY_CARE_PROVIDER_SITE_OTHER): Payer: Medicare Other | Admitting: *Deleted

## 2015-09-13 DIAGNOSIS — E785 Hyperlipidemia, unspecified: Secondary | ICD-10-CM | POA: Diagnosis not present

## 2015-09-13 LAB — LIPID PANEL
Cholesterol: 144 mg/dL (ref 125–200)
HDL: 59 mg/dL (ref >=46)
LDL CALC: 68 mg/dL (ref <130)
Total CHOL/HDL Ratio: 2.4 Ratio (ref <=5.0)
Triglycerides: 87 mg/dL (ref <150)
VLDL: 17 mg/dL (ref <30)

## 2015-09-13 LAB — HEPATIC FUNCTION PANEL
ALT: 25 U/L (ref 6–29)
AST: 20 U/L (ref 10–35)
Albumin: 4.2 g/dL (ref 3.6–5.1)
Alkaline Phosphatase: 74 U/L (ref 33–130)
BILIRUBIN DIRECT: 0.2 mg/dL (ref <=0.2)
BILIRUBIN INDIRECT: 0.5 mg/dL (ref 0.2–1.2)
BILIRUBIN TOTAL: 0.7 mg/dL (ref 0.2–1.2)
Total Protein: 6.7 g/dL (ref 6.1–8.1)

## 2015-09-21 DIAGNOSIS — N390 Urinary tract infection, site not specified: Secondary | ICD-10-CM | POA: Diagnosis not present

## 2015-09-21 DIAGNOSIS — R300 Dysuria: Secondary | ICD-10-CM | POA: Diagnosis not present

## 2015-10-11 DIAGNOSIS — N390 Urinary tract infection, site not specified: Secondary | ICD-10-CM | POA: Diagnosis not present

## 2015-10-11 DIAGNOSIS — E119 Type 2 diabetes mellitus without complications: Secondary | ICD-10-CM | POA: Diagnosis not present

## 2015-10-11 DIAGNOSIS — E559 Vitamin D deficiency, unspecified: Secondary | ICD-10-CM | POA: Diagnosis not present

## 2015-10-11 DIAGNOSIS — E784 Other hyperlipidemia: Secondary | ICD-10-CM | POA: Diagnosis not present

## 2015-10-14 DIAGNOSIS — Z1212 Encounter for screening for malignant neoplasm of rectum: Secondary | ICD-10-CM | POA: Diagnosis not present

## 2015-10-18 DIAGNOSIS — I10 Essential (primary) hypertension: Secondary | ICD-10-CM | POA: Diagnosis not present

## 2015-10-18 DIAGNOSIS — R600 Localized edema: Secondary | ICD-10-CM | POA: Diagnosis not present

## 2015-10-18 DIAGNOSIS — E559 Vitamin D deficiency, unspecified: Secondary | ICD-10-CM | POA: Diagnosis not present

## 2015-10-18 DIAGNOSIS — E119 Type 2 diabetes mellitus without complications: Secondary | ICD-10-CM | POA: Diagnosis not present

## 2015-10-18 DIAGNOSIS — E784 Other hyperlipidemia: Secondary | ICD-10-CM | POA: Diagnosis not present

## 2015-10-18 DIAGNOSIS — Z6830 Body mass index (BMI) 30.0-30.9, adult: Secondary | ICD-10-CM | POA: Diagnosis not present

## 2015-10-18 DIAGNOSIS — Z0000 Encounter for general adult medical examination without abnormal findings: Secondary | ICD-10-CM | POA: Diagnosis not present

## 2015-10-18 DIAGNOSIS — Z1389 Encounter for screening for other disorder: Secondary | ICD-10-CM | POA: Diagnosis not present

## 2015-12-19 DIAGNOSIS — H16143 Punctate keratitis, bilateral: Secondary | ICD-10-CM | POA: Diagnosis not present

## 2015-12-19 DIAGNOSIS — H0289 Other specified disorders of eyelid: Secondary | ICD-10-CM | POA: Diagnosis not present

## 2015-12-20 ENCOUNTER — Other Ambulatory Visit: Payer: Self-pay | Admitting: Internal Medicine

## 2015-12-20 DIAGNOSIS — Z1231 Encounter for screening mammogram for malignant neoplasm of breast: Secondary | ICD-10-CM

## 2016-01-18 ENCOUNTER — Ambulatory Visit
Admission: RE | Admit: 2016-01-18 | Discharge: 2016-01-18 | Disposition: A | Discharge status: Home / Self Care | Payer: Medicare Other | Source: Ambulatory Visit | Attending: Internal Medicine | Admitting: Internal Medicine

## 2016-01-18 DIAGNOSIS — Z1231 Encounter for screening mammogram for malignant neoplasm of breast: Secondary | ICD-10-CM | POA: Diagnosis not present

## 2016-01-24 DIAGNOSIS — Z6379 Other stressful life events affecting family and household: Secondary | ICD-10-CM | POA: Diagnosis not present

## 2016-01-24 DIAGNOSIS — K589 Irritable bowel syndrome without diarrhea: Secondary | ICD-10-CM | POA: Diagnosis not present

## 2016-01-24 DIAGNOSIS — E119 Type 2 diabetes mellitus without complications: Secondary | ICD-10-CM | POA: Diagnosis not present

## 2016-01-24 DIAGNOSIS — G4709 Other insomnia: Secondary | ICD-10-CM | POA: Diagnosis not present

## 2016-01-24 DIAGNOSIS — R51 Headache: Secondary | ICD-10-CM | POA: Diagnosis not present

## 2016-01-24 DIAGNOSIS — Z6830 Body mass index (BMI) 30.0-30.9, adult: Secondary | ICD-10-CM | POA: Diagnosis not present

## 2016-01-24 DIAGNOSIS — E784 Other hyperlipidemia: Secondary | ICD-10-CM | POA: Diagnosis not present

## 2016-01-24 DIAGNOSIS — Z23 Encounter for immunization: Secondary | ICD-10-CM | POA: Diagnosis not present

## 2016-01-24 DIAGNOSIS — R1915 Other abnormal bowel sounds: Secondary | ICD-10-CM | POA: Diagnosis not present

## 2016-01-24 DIAGNOSIS — I10 Essential (primary) hypertension: Secondary | ICD-10-CM | POA: Diagnosis not present

## 2016-01-26 ENCOUNTER — Ambulatory Visit (INDEPENDENT_AMBULATORY_CARE_PROVIDER_SITE_OTHER): Payer: Medicare Other | Admitting: Obstetrics & Gynecology

## 2016-01-26 ENCOUNTER — Encounter: Payer: Self-pay | Admitting: Obstetrics & Gynecology

## 2016-01-26 VITALS — BP 120/76 | HR 70 | Resp 14 | Ht 63.75 in | Wt 174.0 lb

## 2016-01-26 DIAGNOSIS — Z124 Encounter for screening for malignant neoplasm of cervix: Secondary | ICD-10-CM

## 2016-01-26 DIAGNOSIS — Z0000 Encounter for general adult medical examination without abnormal findings: Secondary | ICD-10-CM

## 2016-01-26 DIAGNOSIS — D45 Polycythemia vera: Secondary | ICD-10-CM

## 2016-01-26 DIAGNOSIS — Z01419 Encounter for gynecological examination (general) (routine) without abnormal findings: Secondary | ICD-10-CM

## 2016-01-26 DIAGNOSIS — E559 Vitamin D deficiency, unspecified: Secondary | ICD-10-CM

## 2016-01-26 LAB — POCT URINALYSIS DIPSTICK
Bilirubin, UA: NEGATIVE
Blood, UA: NEGATIVE
GLUCOSE UA: NEGATIVE
KETONES UA: NEGATIVE
LEUKOCYTES UA: NEGATIVE
Nitrite, UA: NEGATIVE
PROTEIN UA: NEGATIVE
Urobilinogen, UA: NEGATIVE
pH, UA: 5

## 2016-01-26 LAB — CBC
HCT: 45.6 % — ABNORMAL HIGH (ref 35.0–45.0)
HEMOGLOBIN: 15.5 g/dL (ref 11.7–15.5)
MCH: 29.2 pg (ref 27.0–33.0)
MCHC: 34 g/dL (ref 32.0–36.0)
MCV: 85.9 fL (ref 80.0–100.0)
MPV: 10.2 fL (ref 7.5–12.5)
PLATELETS: 307 10*3/uL (ref 140–400)
RBC: 5.31 MIL/uL — AB (ref 3.80–5.10)
RDW: 13.5 % (ref 11.0–15.0)
WBC: 8 10*3/uL (ref 3.8–10.8)

## 2016-01-26 MED ORDER — ESTRADIOL 0.5 MG PO TABS: 0.5000 mg | ORAL_TABLET | Freq: Every day | ORAL | 4 refills | Status: DC

## 2016-01-26 NOTE — Progress Notes (Signed)
75 y.o. G63P2002 Married Caucasian F here for annual exam.  Doing well.  Still working in Personal assistant.  Denies vagina bleeding.    PCP:  Dr. Jackelyn Poling.  Is being seen every three months.  Last appt was last month.  Last HbA1C was 6.6.  Did blood work then.    Patient's last menstrual period was 04/02/1988.          Sexually active: No.  The current method of family planning is post menopausal status.    Exercising: Yes.    walking Smoker:  Former smoker- quit date 10/22/61   Health Maintenance: Pap:  12/15/13 negative  History of abnormal Pap:  no MMG:  01/19/16 BIRADS 1 negative  Colonoscopy:  03/06/12 diverticulosis- repeat 10 years BMD:   12/31/12 osteopenia  TDaP:  09/04/11 Pneumonia vaccine(s):  01/21/08 Zostavax:   06/19/07  Hep C testing: not indicated Screening Labs: drawn today, Urine today: normal    reports that she quit smoking about 54 years ago. She has never used smokeless tobacco. She reports that she does not drink alcohol or use drugs.  Past Medical History:  Diagnosis Date  . Anal fissure    as a child  . Asthma   . Diabetes mellitus    managed by diet and exercise  . Diverticulosis of colon (without mention of hemorrhage)   . Fatty liver   . Hyperlipidemia   . Irritable bowel syndrome   . Lymphocytic colitis    chronic  . Osteoarthritis   . Osteopenia 09/2007  . Other mucopurulent conjunctivitis   . Premature ventricular contractions   . Raynaud's syndrome   . Rosacea    opitcal  . Spinal stenosis     Past Surgical History:  Procedure Laterality Date  . CATARACT EXTRACTION Bilateral   . CHOLECYSTECTOMY    . dental implants    . DILATION AND CURETTAGE OF UTERUS  1990  . FOOT SURGERY    . HIP SURGERY Bilateral 1973  . Jaw Implants  2007  . KNEE ARTHROSCOPY Right   . lens implant    . ovarian wedge resection  1964  . REPLACEMENT TOTAL KNEE Bilateral 2005, 2007  . TOTAL ABDOMINAL HYSTERECTOMY W/ BILATERAL SALPINGOOPHORECTOMY  1990   Excessive  Bleeding   . TOTAL KNEE ARTHROPLASTY Left 08/2003  . TOTAL KNEE ARTHROPLASTY Right 08/2005    Current Outpatient Prescriptions  Medication Sig Dispense Refill  . acetaminophen (TYLENOL) 650 MG CR tablet Take 650 mg by mouth every 8 (eight) hours as needed for pain.    Marland Kitchen atorvastatin (LIPITOR) 10 MG tablet Take 1 tablet (10 mg total) by mouth daily. 90 tablet 3  . chlorhexidine (PERIDEX) 0.12 % solution Use as directed 5 mLs in the mouth or throat as needed.     . Cholecalciferol (VITAMIN D-1000 MAX ST) 1000 units tablet Take by mouth.    . diphenoxylate-atropine (LOMOTIL) 2.5-0.025 MG per tablet TAKE ONE (1) TABLET FOUR TIMES EACH DAY AS NEEDED FOR DIARRHEA 30 tablet 0  . Efinaconazole 10 % SOLN Apply 1 drop topically daily.    Marland Kitchen estradiol (ESTRACE) 0.5 MG tablet Take 1 tablet (0.5 mg total) by mouth daily. Take 0.5 mg by mouth daily. 30 tablet 13  . glucose blood ([facility]S BLOOD GLUCOSE TEST STRIPS) test strip Use as instructed to test 2 times daily    . metFORMIN (GLUCOPHAGE-XR) 500 MG 24 hr tablet Take by mouth.    . potassium chloride (K-DUR,KLOR-CON) 10 MEQ tablet Take 2 tablets (  20 mEq total) by mouth as directed. 60 tablet 6  . Probiotic Product (PROBIOTIC DAILY PO) Take 1 tablet by mouth daily.    . ranitidine (ZANTAC) 75 MG tablet Take 75 mg by mouth as needed.    Marland Kitchen spironolactone-hydrochlorothiazide (ALDACTAZIDE) 25-25 MG per tablet TAKE ONE TABLET EACH DAY 30 tablet 5   No current facility-administered medications for this visit.     Family History  Problem Relation Age of Onset  . Heart disease Father   . Diabetes Paternal Uncle   . Ovarian cancer Maternal Grandmother   . Lung cancer Sister   . Colon cancer Neg Hx   . Stomach cancer Neg Hx   . Breast cancer Sister 68  . Osteoporosis Mother     ROS:  Pertinent items are noted in HPI.  Otherwise, a comprehensive ROS was negative.  Exam:   BP 120/76 (BP Location: Right Arm, Patient Position: Sitting, Cuff Size: Normal)    Pulse 70   Resp 14   Ht 5' 3.75" (1.619 m)   Wt 174 lb (78.9 kg)   LMP 04/02/1988   BMI 30.10 kg/m   Weight change: stable  Ht Readings from Last 3 Encounters:  01/26/16 5' 3.75" (1.619 m)  06/03/15 5' 4.75" (1.645 m)  01/25/15 5' 4.25" (1.632 m)   General appearance: alert, cooperative and appears stated age Head: Normocephalic, without obvious abnormality, atraumatic Neck: no adenopathy, supple, symmetrical, trachea midline and thyroid normal to inspection and palpation Lungs: clear to auscultation bilaterally Breasts: normal appearance, no masses or tenderness Heart: regular rate and rhythm Abdomen: soft, non-tender; bowel sounds normal; no masses,  no organomegaly Extremities: extremities normal, atraumatic, no cyanosis or edema Skin: Skin color, texture, turgor normal. No rashes or lesions Lymph nodes: Cervical, supraclavicular, and axillary nodes normal. No abnormal inguinal nodes palpated Neurologic: Grossly normal   Pelvic: External genitalia:  no lesions              Urethra:  normal appearing urethra with no masses, tenderness or lesions              Bartholins and Skenes: normal                 Vagina: normal appearing vagina with normal color and discharge, no lesions              Cervix: no lesions              Pap taken: Yes.   Bimanual Exam:  Uterus:  normal size, contour, position, consistency, mobility, non-tender              Adnexa: normal adnexa and no mass, fullness, tenderness               Rectovaginal: Confirms               Anus:  normal sphincter tone, no lesions  Chaperone was present for exam.  A:  Well Woman with normal exam PMP, on low dose HRT Family hx of ovarian cancer H/O osteopenia Insomnia H/O recurrent UTI Social stressor with husband (memory issues).  Living in [place] now  P: Mammogram guidelines reviewed.  Pt doing yearly. Estradiol 0.5mg  1/2 daily.  #30/13 RF.  Aware of risks of breast cancer, DVT, stroke, MI and  continues to desire  Pap obtained today.  Pt is aware of guidelines but continues to desire Pap smears Labs/vaccine with Dr. Jackelyn Poling. CBC and Vit D return annually or prn

## 2016-01-27 LAB — IPS PAP SMEAR ONLY

## 2016-01-27 LAB — VITAMIN D 25 HYDROXY (VIT D DEFICIENCY, FRACTURES): VIT D 25 HYDROXY: 31 ng/mL (ref 30–100)

## 2016-02-02 DIAGNOSIS — Z6830 Body mass index (BMI) 30.0-30.9, adult: Secondary | ICD-10-CM | POA: Diagnosis not present

## 2016-02-02 DIAGNOSIS — H6093 Unspecified otitis externa, bilateral: Secondary | ICD-10-CM | POA: Diagnosis not present

## 2016-02-02 DIAGNOSIS — H6121 Impacted cerumen, right ear: Secondary | ICD-10-CM | POA: Diagnosis not present

## 2016-02-08 ENCOUNTER — Encounter: Payer: Self-pay | Admitting: Podiatry

## 2016-02-08 ENCOUNTER — Ambulatory Visit (INDEPENDENT_AMBULATORY_CARE_PROVIDER_SITE_OTHER): Payer: Medicare Other | Admitting: Podiatry

## 2016-02-08 DIAGNOSIS — M779 Enthesopathy, unspecified: Secondary | ICD-10-CM

## 2016-02-08 DIAGNOSIS — M2042 Other hammer toe(s) (acquired), left foot: Secondary | ICD-10-CM

## 2016-02-08 MED ORDER — TRIAMCINOLONE ACETONIDE 10 MG/ML IJ SUSP
10.0000 mg | Freq: Once | INTRAMUSCULAR | Status: AC
  Administered 2016-02-08: 10 mg

## 2016-02-08 NOTE — Progress Notes (Signed)
Subjective:     Patient ID: Brittney Fox, female   DOB: Sep 27, 1940, 75 y.o.   MRN: [id number]  HPI patient presents with ambulation fourth digits both feet with fluid buildup that she states did very well for at least 6 months   Review of Systems     Objective:   Physical Exam Neurovascular status intact with inflammatory changes of the interphalangeal joint digit for bilateral with fluid buildup and pain    Assessment:     Inflammatory capsulitis fourth digit bilateral    Plan:     Interphalangeal joint injections administered bilateral to milligrams dexamethasone 1 mg Kenalog 3 mg Xylocaine and applied padding

## 2016-02-29 DIAGNOSIS — M47817 Spondylosis without myelopathy or radiculopathy, lumbosacral region: Secondary | ICD-10-CM | POA: Diagnosis not present

## 2016-02-29 DIAGNOSIS — M47896 Other spondylosis, lumbar region: Secondary | ICD-10-CM | POA: Diagnosis not present

## 2016-02-29 DIAGNOSIS — M5136 Other intervertebral disc degeneration, lumbar region: Secondary | ICD-10-CM | POA: Diagnosis not present

## 2016-02-29 DIAGNOSIS — M48061 Spinal stenosis, lumbar region without neurogenic claudication: Secondary | ICD-10-CM | POA: Diagnosis not present

## 2016-03-01 DIAGNOSIS — E119 Type 2 diabetes mellitus without complications: Secondary | ICD-10-CM | POA: Diagnosis not present

## 2016-03-22 DIAGNOSIS — H0014 Chalazion left upper eyelid: Secondary | ICD-10-CM | POA: Diagnosis not present

## 2016-03-22 DIAGNOSIS — E119 Type 2 diabetes mellitus without complications: Secondary | ICD-10-CM | POA: Diagnosis not present

## 2016-03-22 DIAGNOSIS — Z961 Presence of intraocular lens: Secondary | ICD-10-CM | POA: Diagnosis not present

## 2016-04-25 DIAGNOSIS — K589 Irritable bowel syndrome without diarrhea: Secondary | ICD-10-CM | POA: Diagnosis not present

## 2016-04-25 DIAGNOSIS — E119 Type 2 diabetes mellitus without complications: Secondary | ICD-10-CM | POA: Diagnosis not present

## 2016-04-25 DIAGNOSIS — M1990 Unspecified osteoarthritis, unspecified site: Secondary | ICD-10-CM | POA: Diagnosis not present

## 2016-04-25 DIAGNOSIS — Z6829 Body mass index (BMI) 29.0-29.9, adult: Secondary | ICD-10-CM | POA: Diagnosis not present

## 2016-05-18 ENCOUNTER — Ambulatory Visit: Payer: Medicare Other | Admitting: Obstetrics & Gynecology

## 2016-07-06 ENCOUNTER — Other Ambulatory Visit: Payer: Self-pay | Admitting: Cardiovascular Disease

## 2016-07-06 DIAGNOSIS — E785 Hyperlipidemia, unspecified: Secondary | ICD-10-CM

## 2016-07-10 ENCOUNTER — Ambulatory Visit (INDEPENDENT_AMBULATORY_CARE_PROVIDER_SITE_OTHER): Payer: Medicare Other | Admitting: Podiatry

## 2016-07-10 DIAGNOSIS — M779 Enthesopathy, unspecified: Secondary | ICD-10-CM

## 2016-07-10 DIAGNOSIS — L84 Corns and callosities: Secondary | ICD-10-CM

## 2016-07-10 MED ORDER — TRIAMCINOLONE ACETONIDE 10 MG/ML IJ SUSP
10.0000 mg | Freq: Once | INTRAMUSCULAR | Status: AC
  Administered 2016-07-10: 10 mg

## 2016-07-11 ENCOUNTER — Encounter: Payer: Self-pay | Admitting: Cardiovascular Disease

## 2016-07-12 NOTE — Progress Notes (Signed)
Subjective:     Patient ID: Brittney Fox, female   DOB: 10/02/1940, 76 y.o.   MRN: [id number]  HPI patient states the inner side of the fourth digit both feet that become very inflamed and hard to wear shoe gear with and she's developed a lesion on the fourth toe left foot   Review of Systems     Objective:   Physical Exam Neurovascular status intact negative Homans sign noted with inflammatory changes on the lateral side interphalangeal joint digit for bilateral with rotation of the fifth toe noted and keratotic lesion fourth left    Assessment:     Inflammatory capsulitis fourth digit bilateral with keratotic lesion interdigital and hammertoe deformity left over right    Plan:     H&P and all conditions discussed independently. At this point proximal block 60 mg Marcaine Xylocaine accomplished and I then did sterile preps and injected the interphalangeal joint bilateral with 1 mg Dexon some 1 mg Kenalog. Debrided lesion left applied padding reappoint as needed

## 2016-07-30 ENCOUNTER — Encounter: Payer: Self-pay | Admitting: Cardiovascular Disease

## 2016-07-30 ENCOUNTER — Ambulatory Visit (INDEPENDENT_AMBULATORY_CARE_PROVIDER_SITE_OTHER): Payer: Medicare Other | Admitting: Cardiovascular Disease

## 2016-07-30 VITALS — BP 126/80 | HR 68 | Ht 65.0 in | Wt 177.8 lb

## 2016-07-30 DIAGNOSIS — E785 Hyperlipidemia, unspecified: Secondary | ICD-10-CM

## 2016-07-30 DIAGNOSIS — R0602 Shortness of breath: Secondary | ICD-10-CM

## 2016-07-30 DIAGNOSIS — R600 Localized edema: Secondary | ICD-10-CM

## 2016-07-30 MED ORDER — ASPIRIN EC 81 MG PO TBEC: 81.0000 mg | DELAYED_RELEASE_TABLET | Freq: Every day | ORAL | Status: DC

## 2016-07-30 NOTE — Progress Notes (Signed)
Cardiology Office Note Date:  07/30/2016   ID:  Brittney Fox, DOB 11-Aug-1940, MRN [id number]  PCP:  Velna Hatchet, MD  Cardiologist:  [name], MD    Chief Complaint  Patient presents with  . Shortness of Breath     History of Present Illness: Brittney Fox is a 76 y.o. female who presents for follow-up evaluation. The patient is been seen with shortness of breath in the past. An echo study in 2015 showed mild pulmonary hypertension, normal LV function, and no significant diastolic dysfunction. There was no valvular disease identified. An exercise stress echo demonstrated no ischemic changes. BNP was normal.  The patient is here alone today. She's been under a lot of stress with her husband's progressive decline in health. He is not able to walk any longer. He weighs 280 pounds she has to help transfer him. 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 She denies any recent chest pain or pressure. She has stable shortness of breath with activity. No orthopnea or PND. Complains of leg swelling and spider veins in her legs. She wears compression stockings and tries to elevate her legs.   Past Medical History:  Diagnosis Date  . Anal fissure    as a child  . Asthma   . Diabetes mellitus    managed by diet and exercise  . Diverticulosis of colon (without mention of hemorrhage)   . Fatty liver   . Hyperlipidemia   . Irritable bowel syndrome   . Lymphocytic colitis    chronic  . Osteoarthritis   . Osteopenia 09/2007  . Other mucopurulent conjunctivitis   . Premature ventricular contractions   . Raynaud's syndrome   . Rosacea    opitcal  . Spinal stenosis     Past Surgical History:  Procedure Laterality Date  . CATARACT EXTRACTION Bilateral   . CHOLECYSTECTOMY    . dental implants    . DILATION AND CURETTAGE OF UTERUS  1990  . FOOT SURGERY    . HIP SURGERY Bilateral 1973  . Jaw Implants  2007  . KNEE ARTHROSCOPY Right   . lens implant    . ovarian wedge resection   1964  . REPLACEMENT TOTAL KNEE Bilateral 2005, 2007  . TOTAL ABDOMINAL HYSTERECTOMY W/ BILATERAL SALPINGOOPHORECTOMY  1990   Excessive Bleeding   . TOTAL KNEE ARTHROPLASTY Left 08/2003  . TOTAL KNEE ARTHROPLASTY Right 08/2005    Current Outpatient Prescriptions  Medication Sig Dispense Refill  . acetaminophen (TYLENOL) 650 MG CR tablet Take 650 mg by mouth every 8 (eight) hours as needed for pain.    Marland Kitchen atorvastatin (LIPITOR) 10 MG tablet TAKE ONE TABLET BY MOUTH ONCE DAILY 90 tablet 0  . B Complex Vitamins (VITAMIN B COMPLEX PO) Take 1 tablet by mouth daily.     . chlorhexidine (PERIDEX) 0.12 % solution Use as directed 5 mLs in the mouth or throat as directed.     . Cholecalciferol (VITAMIN D-1000 MAX ST) 1000 units tablet Take 2,000 Units by mouth daily.     . diphenoxylate-atropine (LOMOTIL) 2.5-0.025 MG per tablet TAKE ONE (1) TABLET FOUR TIMES EACH DAY AS NEEDED FOR DIARRHEA 30 tablet 0  . diphenoxylate-atropine (LOMOTIL) 2.5-0.025 MG tablet Take 1 tablet by mouth every morning.    Marland Kitchen estradiol (ESTRACE) 0.5 MG tablet Take 0.25 mg by mouth daily.    Marland Kitchen glucose blood ([facility] BLOOD GLUCOSE TEST STRIPS) test strip Use as instructed to test 2 times daily    . metFORMIN (  GLUCOPHAGE-XR) 500 MG 24 hr tablet Take by mouth.    . Probiotic Product (PROBIOTIC DAILY PO) Take 1 tablet by mouth daily.    . ranitidine (ZANTAC) 75 MG tablet Take 75 mg by mouth as needed.    Marland Kitchen spironolactone-hydrochlorothiazide (ALDACTAZIDE) 25-25 MG per tablet TAKE ONE TABLET EACH DAY 30 tablet 5   No current facility-administered medications for this visit.     Allergies:   Codeine phosphate; Morphine sulfate; and Penicillins   Social History:  The patient  reports that she quit smoking about 54 years ago. She has never used smokeless tobacco. She reports that she does not drink alcohol or use drugs.   Family History:  The patient's  family history includes Breast cancer (age of onset: 45) in her sister; Diabetes in her  paternal uncle; Heart disease in her father; Lung cancer in her sister; Osteoporosis in her mother; Ovarian cancer in her maternal grandmother.    ROS:  Please see the history of present illness.  Otherwise, review of systems is positive for leg swelling, diarrhea, back pain, leg pain, headaches.  All other systems are reviewed and negative.    PHYSICAL EXAM: VS:  BP 126/80   Pulse 68   Ht 5\' 5"  (1.651 m)   Wt 177 lb 12.8 oz (80.6 kg)   LMP 04/02/1988   BMI 29.59 kg/m  , BMI Body mass index is 29.59 kg/m. GEN: Well nourished, well developed, in no acute distress  HEENT: normal  Neck: no JVD, no masses. No carotid bruits Cardiac: RRR without murmur or gallop                Respiratory:  clear to auscultation bilaterally, normal work of breathing GI: soft, nontender, nondistended, + BS MS: no deformity or atrophy  Ext: 1+ bilateral pretibial edema, spider veins both legs, pedal pulses 2+= bilaterally Skin: warm and dry, no rash Neuro:  Strength and sensation are intact Psych: euthymic mood, full affect  EKG:  EKG is ordered today. The ekg ordered today shows NSR, LAFB. No change from last tracing 06/03/2015.  Recent Labs: 09/13/2015: ALT 25 01/26/2016: Hemoglobin 15.5; Platelets 307   Lipid Panel     Component Value Date/Time   CHOL 144 09/13/2015 0734   TRIG 87 09/13/2015 0734   TRIG 88 02/06/2006 1028   HDL 59 09/13/2015 0734   CHOLHDL 2.4 09/13/2015 0734   VLDL 17 09/13/2015 0734   LDLCALC 68 09/13/2015 0734   LDLDIRECT 150.2 11/28/2010 1131      Wt Readings from Last 3 Encounters:  07/30/16 177 lb 12.8 oz (80.6 kg)  01/26/16 174 lb (78.9 kg)  06/03/15 172 lb 1.9 oz (78.1 kg)     Cardiac Studies Reviewed: 2D Echo 06-12-2013: Study Conclusions  - Left ventricle: The cavity size was normal. There was mild concentric hypertrophy. Systolic function was normal. The estimated ejection fraction was in the range of 55% to 60%. Wall motion was normal; there  were no regional wall motion abnormalities. Left ventricular diastolic function parameters were normal. There was no evidence of elevated ventricular filling pressure by Doppler parameters. - Aortic valve: Trileaflet; normal thickness leaflets. No regurgitation. - Aortic root: The aortic root was normal in size. - Mitral valve: Trivial regurgitation. - Left atrium: The atrium was normal in size. - Right ventricle: The cavity size was mildly dilated. Wall thickness was normal. - Right atrium: The atrium was mildly dilated. - Tricuspid valve: Mild-moderate regurgitation. - Pulmonic valve: Transvalvular velocity was within  the normal range. There was no evidence for stenosis. Trivial regurgitation. - Pulmonary arteries: Systolic pressure was mildly increased. PA peak pressure: 38mm Hg (S). - Inferior vena cava: The vessel was dilated; the respirophasic diameter changes were blunted (< 50%); findings are consistent with elevated central venous pressure. - Pericardium, extracardiac: A trivial pericardial effusion was identified. - Impressions: Normal left ventricular systolic and diastolic function. Mildly dilated right ventricle with preserved function and mildly elevated pressure. (RVSP 46 mmHg) but normal left ventricular filling pressure suggestive of pulmonary etiology of shortness of breath. . Impressions:  - Normal left ventricular systolic and diastolic function. Mildly dilated right ventricle with preserved function and mildly elevated pressure. (RVSP 46 mmHg) but normal left ventricular filling pressure suggestive of pulmonary etiology of shortness of breath. .  Stress Echo 06/05/2013: Study Conclusions  - Stress ECG conclusions: Bigeminal PVC's were noted throughout the exercise treadmill with a 6 beat run of wide complex tachycardia There were no stress arrhythmias or conduction abnormalities. The stress ECG was  negative for ischemia. Duke scoring: exercise time of 48min; maximum ST deviation of 41mm; no angina; resulting score is 5. This score predicts a moderate risk of cardiac events. - Staged echo: Normal echo stress Impressions:  - Normal study after maximal exercise.  ASSESSMENT AND PLAN: 1.  Shortness of breath with mild pulmonary hypertension by echo: The patient appears stable. Her last echo was 3 years ago. Need to update an echo study to estimate pulmonary pressures and any change in LV/RV function.  2. Leg edema: Likely venous insufficiency related. She is given information on leg elevation and compression.  3. Hyperlipidemia: Now taking atorvastatin 10 mg. Last lipids in June 2017 showed a good response to therapy with total cholesterol 144, HDL 59, and LDL 68.  4. Type 2 diabetes: Treated with metformin.  Current medicines are reviewed with the patient today.  The patient does not have concerns regarding medicines.  Labs/ tests ordered today include:  No orders of the defined types were placed in this encounter.  Disposition:   FU one year  Signed, [name], MD  07/30/2016 11:56 AM    [facility] [address], [place], [place]  [address] Phone: [phone]; Fax: [phone]

## 2016-07-30 NOTE — Patient Instructions (Addendum)
Medication Instructions:  Your physician has recommended you make the following change in your medication:  1. START Aspirin 81mg  take one tablet by mouth daily  Labwork: No new orders.   Testing/Procedures: Your physician has requested that you have an echocardiogram. Echocardiography is a painless test that uses sound waves to create images of your heart. It provides your doctor with information about the size and shape of your heart and how well your heart's chambers and valves are working. This procedure takes approximately one hour. There are no restrictions for this procedure.  Follow-Up: Your physician wants you to follow-up in: 1 YEAR with Dr Burt Knack.  You will receive a reminder letter in the mail two months in advance. If you don't receive a letter, please call our office to schedule the follow-up appointment.  Any Other Special Instructions Will Be Listed Below (If Applicable).    For your  leg edema you  should do  the following 1. Leg elevation - I recommend the Lounge Dr. Leg rest.  See below for details  2. Salt restriction  -  Use potassium chloride instead of regular salt as a salt substitute. 3. Walk regularly 4. Weight loss     Go to [email or web address]          If you need a refill on your cardiac medications before your next appointment, please call your pharmacy.

## 2016-07-31 DIAGNOSIS — E784 Other hyperlipidemia: Secondary | ICD-10-CM | POA: Diagnosis not present

## 2016-07-31 DIAGNOSIS — I10 Essential (primary) hypertension: Secondary | ICD-10-CM | POA: Diagnosis not present

## 2016-07-31 DIAGNOSIS — E119 Type 2 diabetes mellitus without complications: Secondary | ICD-10-CM | POA: Diagnosis not present

## 2016-07-31 DIAGNOSIS — Z6829 Body mass index (BMI) 29.0-29.9, adult: Secondary | ICD-10-CM | POA: Diagnosis not present

## 2016-08-14 ENCOUNTER — Ambulatory Visit (HOSPITAL_COMMUNITY): Payer: Medicare Other | Attending: Cardiology

## 2016-08-14 ENCOUNTER — Other Ambulatory Visit: Payer: Self-pay

## 2016-08-14 DIAGNOSIS — E785 Hyperlipidemia, unspecified: Secondary | ICD-10-CM | POA: Insufficient documentation

## 2016-08-14 DIAGNOSIS — R0602 Shortness of breath: Secondary | ICD-10-CM | POA: Diagnosis not present

## 2016-10-08 ENCOUNTER — Other Ambulatory Visit: Payer: Self-pay | Admitting: Cardiovascular Disease

## 2016-10-08 DIAGNOSIS — E785 Hyperlipidemia, unspecified: Secondary | ICD-10-CM

## 2016-10-12 DIAGNOSIS — E559 Vitamin D deficiency, unspecified: Secondary | ICD-10-CM | POA: Diagnosis not present

## 2016-10-12 DIAGNOSIS — I10 Essential (primary) hypertension: Secondary | ICD-10-CM | POA: Diagnosis not present

## 2016-10-12 DIAGNOSIS — E119 Type 2 diabetes mellitus without complications: Secondary | ICD-10-CM | POA: Diagnosis not present

## 2016-10-12 DIAGNOSIS — E784 Other hyperlipidemia: Secondary | ICD-10-CM | POA: Diagnosis not present

## 2016-10-18 DIAGNOSIS — E119 Type 2 diabetes mellitus without complications: Secondary | ICD-10-CM | POA: Diagnosis not present

## 2016-10-18 DIAGNOSIS — M859 Disorder of bone density and structure, unspecified: Secondary | ICD-10-CM | POA: Diagnosis not present

## 2016-10-18 DIAGNOSIS — E559 Vitamin D deficiency, unspecified: Secondary | ICD-10-CM | POA: Diagnosis not present

## 2016-10-18 DIAGNOSIS — I10 Essential (primary) hypertension: Secondary | ICD-10-CM | POA: Diagnosis not present

## 2016-10-18 DIAGNOSIS — Z1389 Encounter for screening for other disorder: Secondary | ICD-10-CM | POA: Diagnosis not present

## 2016-10-18 DIAGNOSIS — R600 Localized edema: Secondary | ICD-10-CM | POA: Diagnosis not present

## 2016-10-18 DIAGNOSIS — Z0000 Encounter for general adult medical examination without abnormal findings: Secondary | ICD-10-CM | POA: Diagnosis not present

## 2016-10-18 DIAGNOSIS — Z6830 Body mass index (BMI) 30.0-30.9, adult: Secondary | ICD-10-CM | POA: Diagnosis not present

## 2016-10-18 DIAGNOSIS — E784 Other hyperlipidemia: Secondary | ICD-10-CM | POA: Diagnosis not present

## 2016-10-18 DIAGNOSIS — Z6379 Other stressful life events affecting family and household: Secondary | ICD-10-CM | POA: Diagnosis not present

## 2016-10-29 DIAGNOSIS — M25511 Pain in right shoulder: Secondary | ICD-10-CM | POA: Diagnosis not present

## 2016-10-29 DIAGNOSIS — S46211A Strain of muscle, fascia and tendon of other parts of biceps, right arm, initial encounter: Secondary | ICD-10-CM | POA: Diagnosis not present

## 2016-12-19 ENCOUNTER — Other Ambulatory Visit: Payer: Self-pay | Admitting: Internal Medicine

## 2016-12-19 DIAGNOSIS — Z1231 Encounter for screening mammogram for malignant neoplasm of breast: Secondary | ICD-10-CM

## 2017-01-03 DIAGNOSIS — E119 Type 2 diabetes mellitus without complications: Secondary | ICD-10-CM | POA: Diagnosis not present

## 2017-01-03 DIAGNOSIS — Z23 Encounter for immunization: Secondary | ICD-10-CM | POA: Diagnosis not present

## 2017-01-21 ENCOUNTER — Ambulatory Visit
Admission: RE | Admit: 2017-01-21 | Discharge: 2017-01-21 | Disposition: A | Discharge status: Home / Self Care | Payer: Medicare Other | Source: Ambulatory Visit | Attending: Internal Medicine | Admitting: Internal Medicine

## 2017-01-21 DIAGNOSIS — Z1231 Encounter for screening mammogram for malignant neoplasm of breast: Secondary | ICD-10-CM | POA: Diagnosis not present

## 2017-02-14 DIAGNOSIS — G5602 Carpal tunnel syndrome, left upper limb: Secondary | ICD-10-CM | POA: Diagnosis not present

## 2017-02-14 DIAGNOSIS — G5601 Carpal tunnel syndrome, right upper limb: Secondary | ICD-10-CM | POA: Diagnosis not present

## 2017-02-28 ENCOUNTER — Other Ambulatory Visit: Payer: Self-pay | Admitting: Obstetrics & Gynecology

## 2017-02-28 NOTE — Telephone Encounter (Signed)
Medication refill request: Estradiol  Last AEX:  01/27/16 SM  Next AEX: 05/10/17  Last MMG (if hormonal medication request): 01/21/17 BIRADS 1 negative  Refill authorized: 01/26/16 #90, 4 RF. Today, please advise.

## 2017-03-19 DIAGNOSIS — E119 Type 2 diabetes mellitus without complications: Secondary | ICD-10-CM | POA: Diagnosis not present

## 2017-03-19 DIAGNOSIS — E7849 Other hyperlipidemia: Secondary | ICD-10-CM | POA: Diagnosis not present

## 2017-03-19 DIAGNOSIS — R21 Rash and other nonspecific skin eruption: Secondary | ICD-10-CM | POA: Diagnosis not present

## 2017-03-19 DIAGNOSIS — I10 Essential (primary) hypertension: Secondary | ICD-10-CM | POA: Diagnosis not present

## 2017-03-19 DIAGNOSIS — Z6830 Body mass index (BMI) 30.0-30.9, adult: Secondary | ICD-10-CM | POA: Diagnosis not present

## 2017-04-04 DIAGNOSIS — E119 Type 2 diabetes mellitus without complications: Secondary | ICD-10-CM | POA: Diagnosis not present

## 2017-04-04 DIAGNOSIS — Z961 Presence of intraocular lens: Secondary | ICD-10-CM | POA: Diagnosis not present

## 2017-04-04 DIAGNOSIS — Z7984 Long term (current) use of oral hypoglycemic drugs: Secondary | ICD-10-CM | POA: Diagnosis not present

## 2017-04-13 DIAGNOSIS — M48061 Spinal stenosis, lumbar region without neurogenic claudication: Secondary | ICD-10-CM | POA: Diagnosis not present

## 2017-04-13 DIAGNOSIS — M545 Low back pain: Secondary | ICD-10-CM | POA: Diagnosis not present

## 2017-04-13 DIAGNOSIS — M5136 Other intervertebral disc degeneration, lumbar region: Secondary | ICD-10-CM | POA: Diagnosis not present

## 2017-04-20 DIAGNOSIS — M545 Low back pain: Secondary | ICD-10-CM | POA: Diagnosis not present

## 2017-04-20 DIAGNOSIS — M48061 Spinal stenosis, lumbar region without neurogenic claudication: Secondary | ICD-10-CM | POA: Diagnosis not present

## 2017-04-26 DIAGNOSIS — M48061 Spinal stenosis, lumbar region without neurogenic claudication: Secondary | ICD-10-CM | POA: Diagnosis not present

## 2017-04-26 DIAGNOSIS — M5416 Radiculopathy, lumbar region: Secondary | ICD-10-CM | POA: Diagnosis not present

## 2017-04-30 DIAGNOSIS — J029 Acute pharyngitis, unspecified: Secondary | ICD-10-CM | POA: Diagnosis not present

## 2017-04-30 DIAGNOSIS — B373 Candidiasis of vulva and vagina: Secondary | ICD-10-CM | POA: Diagnosis not present

## 2017-04-30 DIAGNOSIS — I10 Essential (primary) hypertension: Secondary | ICD-10-CM | POA: Diagnosis not present

## 2017-04-30 DIAGNOSIS — J111 Influenza due to unidentified influenza virus with other respiratory manifestations: Secondary | ICD-10-CM | POA: Diagnosis not present

## 2017-04-30 DIAGNOSIS — R05 Cough: Secondary | ICD-10-CM | POA: Diagnosis not present

## 2017-04-30 DIAGNOSIS — Z6830 Body mass index (BMI) 30.0-30.9, adult: Secondary | ICD-10-CM | POA: Diagnosis not present

## 2017-04-30 DIAGNOSIS — M1990 Unspecified osteoarthritis, unspecified site: Secondary | ICD-10-CM | POA: Diagnosis not present

## 2017-04-30 DIAGNOSIS — M79675 Pain in left toe(s): Secondary | ICD-10-CM | POA: Diagnosis not present

## 2017-04-30 DIAGNOSIS — S92912A Unspecified fracture of left toe(s), initial encounter for closed fracture: Secondary | ICD-10-CM | POA: Diagnosis not present

## 2017-05-01 DIAGNOSIS — S92515A Nondisplaced fracture of proximal phalanx of left lesser toe(s), initial encounter for closed fracture: Secondary | ICD-10-CM | POA: Diagnosis not present

## 2017-05-01 DIAGNOSIS — M79672 Pain in left foot: Secondary | ICD-10-CM | POA: Diagnosis not present

## 2017-05-06 DIAGNOSIS — M48061 Spinal stenosis, lumbar region without neurogenic claudication: Secondary | ICD-10-CM | POA: Diagnosis not present

## 2017-05-10 ENCOUNTER — Encounter: Payer: Self-pay | Admitting: Obstetrics & Gynecology

## 2017-05-10 ENCOUNTER — Ambulatory Visit (INDEPENDENT_AMBULATORY_CARE_PROVIDER_SITE_OTHER): Payer: Medicare Other | Admitting: Obstetrics & Gynecology

## 2017-05-10 VITALS — BP 128/82 | Resp 14 | Ht 64.0 in | Wt 180.2 lb

## 2017-05-10 DIAGNOSIS — M8580 Other specified disorders of bone density and structure, unspecified site: Secondary | ICD-10-CM | POA: Diagnosis not present

## 2017-05-10 DIAGNOSIS — Z01419 Encounter for gynecological examination (general) (routine) without abnormal findings: Secondary | ICD-10-CM | POA: Diagnosis not present

## 2017-05-10 DIAGNOSIS — E2839 Other primary ovarian failure: Secondary | ICD-10-CM

## 2017-05-10 DIAGNOSIS — Z124 Encounter for screening for malignant neoplasm of cervix: Secondary | ICD-10-CM

## 2017-05-10 MED ORDER — ESTRADIOL 0.5 MG PO TABS: 0.2500 mg | ORAL_TABLET | Freq: Every day | ORAL | 4 refills | Status: DC

## 2017-05-10 NOTE — Progress Notes (Signed)
77 y.o. [id number] MarriedCaucasianF here for annual exam.  Husband was just moved to [place] yesterday.  A little sad today because of this.  Husband is still very kind and nice but not sure he understands this today.  Denies vaginal bleeding.    Patient's last menstrual period was 04/02/1988.          Sexually active: No.  The current method of family planning is status post hysterectomy.    Exercising: Yes.    walking Smoker:  Former smoker   Health Maintenance: Pap:  01/26/16 negative, 12/15/13 negative   History of abnormal Pap:  no MMG:  01/21/17 BIRADS 1 negative  Colonoscopy:  03/16/12 diverticulosis- repeat 10 years  BMD:   12/31/12 osteopenia TDaP:  09/04/11 Pneumonia vaccine(s):  01/21/08 Shingrix:   06/19/07  Hep C testing: not indicated  Screening Labs: PCP, Hb today: PCP, Urine today: has sample if needed    reports that she quit smoking about 55 years ago. she has never used smokeless tobacco. She reports that she does not drink alcohol or use drugs.  Past Medical History:  Diagnosis Date  . Anal fissure    as a child  . Asthma   . Diabetes mellitus    managed by diet and exercise  . Diverticulosis of colon (without mention of hemorrhage)   . Fatty liver   . Hyperlipidemia   . Irritable bowel syndrome   . Lymphocytic colitis    chronic  . Osteoarthritis   . Osteopenia 09/2007  . Other mucopurulent conjunctivitis   . Premature ventricular contractions   . Raynaud's syndrome   . Rosacea    opitcal  . Spinal stenosis     Past Surgical History:  Procedure Laterality Date  . CATARACT EXTRACTION Bilateral   . CHOLECYSTECTOMY    . dental implants    . DILATION AND CURETTAGE OF UTERUS  1990  . FOOT SURGERY    . HIP SURGERY Bilateral 1973  . Jaw Implants  2007  . KNEE ARTHROSCOPY Right   . lens implant    . ovarian wedge resection  1964  . REPLACEMENT TOTAL KNEE Bilateral 2005, 2007  . TOTAL ABDOMINAL HYSTERECTOMY W/ BILATERAL SALPINGOOPHORECTOMY   1990   Excessive Bleeding   . TOTAL KNEE ARTHROPLASTY Left 08/2003  . TOTAL KNEE ARTHROPLASTY Right 08/2005    Current Outpatient Medications  Medication Sig Dispense Refill  . acetaminophen (TYLENOL) 650 MG CR tablet Take 650 mg by mouth every 8 (eight) hours as needed for pain.    Marland Kitchen aspirin EC 81 MG tablet Take 1 tablet (81 mg total) by mouth daily.    . B Complex Vitamins (VITAMIN B COMPLEX PO) Take 1 tablet by mouth daily.     . chlorhexidine (PERIDEX) 0.12 % solution Use as directed 5 mLs in the mouth or throat as directed.     . Cholecalciferol (VITAMIN D-1000 MAX ST) 1000 units tablet Take 2,000 Units by mouth daily.     . diphenoxylate-atropine (LOMOTIL) 2.5-0.025 MG per tablet TAKE ONE (1) TABLET FOUR TIMES EACH DAY AS NEEDED FOR DIARRHEA 30 tablet 0  . estradiol (ESTRACE) 0.5 MG tablet Take 0.25 mg by mouth daily.    Marland Kitchen glucose blood ([facility]S BLOOD GLUCOSE TEST STRIPS) test strip Use as instructed to test 2 times daily    . Probiotic Product (PROBIOTIC DAILY PO) Take 1 tablet by mouth daily.    . ranitidine (ZANTAC) 75 MG tablet Take 75 mg by mouth as needed.    Marland Kitchen  sitaGLIPtin (JANUVIA) 50 MG tablet Januvia 50 mg tablet    . spironolactone-hydrochlorothiazide (ALDACTAZIDE) 25-25 MG per tablet TAKE ONE TABLET EACH DAY 30 tablet 5  . atorvastatin (LIPITOR) 10 MG tablet TAKE ONE TABLET BY MOUTH ONCE DAILY (Patient not taking: Reported on 05/10/2017) 90 tablet 2   No current facility-administered medications for this visit.     Family History  Problem Relation Age of Onset  . Lung cancer Sister   . Osteoporosis Mother   . Heart disease Father   . Diabetes Paternal Uncle   . Ovarian cancer Maternal Grandmother   . Breast cancer Sister 75  . Colon cancer Neg Hx   . Stomach cancer Neg Hx     ROS:  Pertinent items are noted in HPI.  Otherwise, a comprehensive ROS was negative.  Exam:   BP 128/82 (BP Location: Right Arm, Patient Position: Sitting, Cuff Size: Normal)   Resp 14   Ht 5'  4" (1.626 m)   Wt 180 lb 4 oz (81.8 kg)   LMP 04/02/1988   BMI 30.94 kg/m   Weight change: +6#  Height: 5\' 4"  (162.6 cm)  Ht Readings from Last 3 Encounters:  05/10/17 5\' 4"  (1.626 m)  07/30/16 5\' 5"  (1.651 m)  01/26/16 5' 3.75" (1.619 m)    General appearance: alert, cooperative and appears stated age Head: Normocephalic, without obvious abnormality, atraumatic Neck: no adenopathy, supple, symmetrical, trachea midline and thyroid normal to inspection and palpation Lungs: clear to auscultation bilaterally Breasts: normal appearance, no masses or tenderness Heart: regular rate and rhythm Abdomen: soft, non-tender; bowel sounds normal; no masses,  no organomegaly Extremities: extremities normal, atraumatic, no cyanosis or edema Skin: Skin color, texture, turgor normal. No rashes or lesions Lymph nodes: Cervical, supraclavicular, and axillary nodes normal. No abnormal inguinal nodes palpated Neurologic: Grossly normal   Pelvic: External genitalia:  no lesions              Urethra:  normal appearing urethra with no masses, tenderness or lesions              Bartholins and Skenes: normal                 Vagina: normal appearing vagina with normal color and discharge, no lesions              Cervix: absent               Pap taken: No. Bimanual Exam:  Uterus:  uterus absent              Adnexa: no mass, fullness, tenderness               Rectovaginal: Confirms               Anus:  normal sphincter tone, no lesions  Chaperone was present for exam.  A:  Well Woman with normal exam PMP, on low dose HRT family hx of ovarian cancer Mild osteopenia H/O recurrent UTI Stressors with husband now in skilled living at [facility]  P:   Mammogram guidelines reviewed pap smear not obtained.  Done 10/17 Lab work was UTD RF for estradiol 0.20m 1/2 tab daily.  #90/4RF.  Desires to continue.  Aware of risk. return annually or prn

## 2017-05-20 DIAGNOSIS — M5416 Radiculopathy, lumbar region: Secondary | ICD-10-CM | POA: Diagnosis not present

## 2017-05-23 DIAGNOSIS — M5416 Radiculopathy, lumbar region: Secondary | ICD-10-CM | POA: Diagnosis not present

## 2017-05-27 DIAGNOSIS — M5416 Radiculopathy, lumbar region: Secondary | ICD-10-CM | POA: Diagnosis not present

## 2017-05-28 DIAGNOSIS — M5136 Other intervertebral disc degeneration, lumbar region: Secondary | ICD-10-CM | POA: Diagnosis not present

## 2017-05-28 DIAGNOSIS — M5416 Radiculopathy, lumbar region: Secondary | ICD-10-CM | POA: Diagnosis not present

## 2017-05-28 DIAGNOSIS — M48061 Spinal stenosis, lumbar region without neurogenic claudication: Secondary | ICD-10-CM | POA: Diagnosis not present

## 2017-06-03 DIAGNOSIS — M5416 Radiculopathy, lumbar region: Secondary | ICD-10-CM | POA: Diagnosis not present

## 2017-06-06 DIAGNOSIS — M5416 Radiculopathy, lumbar region: Secondary | ICD-10-CM | POA: Diagnosis not present

## 2017-06-10 DIAGNOSIS — M5416 Radiculopathy, lumbar region: Secondary | ICD-10-CM | POA: Diagnosis not present

## 2017-06-11 DIAGNOSIS — M79672 Pain in left foot: Secondary | ICD-10-CM | POA: Diagnosis not present

## 2017-06-11 DIAGNOSIS — S92515A Nondisplaced fracture of proximal phalanx of left lesser toe(s), initial encounter for closed fracture: Secondary | ICD-10-CM | POA: Diagnosis not present

## 2017-06-13 DIAGNOSIS — M5416 Radiculopathy, lumbar region: Secondary | ICD-10-CM | POA: Diagnosis not present

## 2017-06-17 DIAGNOSIS — M5416 Radiculopathy, lumbar region: Secondary | ICD-10-CM | POA: Diagnosis not present

## 2017-06-18 ENCOUNTER — Ambulatory Visit
Admission: RE | Admit: 2017-06-18 | Discharge: 2017-06-18 | Disposition: A | Discharge status: Home / Self Care | Payer: Medicare Other | Source: Ambulatory Visit | Attending: Obstetrics & Gynecology | Admitting: Obstetrics & Gynecology

## 2017-06-18 DIAGNOSIS — Z780 Asymptomatic menopausal state: Secondary | ICD-10-CM | POA: Diagnosis not present

## 2017-06-18 DIAGNOSIS — E2839 Other primary ovarian failure: Secondary | ICD-10-CM

## 2017-06-18 DIAGNOSIS — Z13820 Encounter for screening for osteoporosis: Secondary | ICD-10-CM | POA: Diagnosis not present

## 2017-06-18 DIAGNOSIS — M8580 Other specified disorders of bone density and structure, unspecified site: Secondary | ICD-10-CM

## 2017-06-20 DIAGNOSIS — M5416 Radiculopathy, lumbar region: Secondary | ICD-10-CM | POA: Diagnosis not present

## 2017-06-21 DIAGNOSIS — Z6830 Body mass index (BMI) 30.0-30.9, adult: Secondary | ICD-10-CM | POA: Diagnosis not present

## 2017-06-21 DIAGNOSIS — G4700 Insomnia, unspecified: Secondary | ICD-10-CM | POA: Diagnosis not present

## 2017-06-21 DIAGNOSIS — M8580 Other specified disorders of bone density and structure, unspecified site: Secondary | ICD-10-CM | POA: Diagnosis not present

## 2017-06-21 DIAGNOSIS — K589 Irritable bowel syndrome without diarrhea: Secondary | ICD-10-CM | POA: Diagnosis not present

## 2017-06-21 DIAGNOSIS — G5601 Carpal tunnel syndrome, right upper limb: Secondary | ICD-10-CM | POA: Diagnosis not present

## 2017-06-21 DIAGNOSIS — I10 Essential (primary) hypertension: Secondary | ICD-10-CM | POA: Diagnosis not present

## 2017-06-21 DIAGNOSIS — M79673 Pain in unspecified foot: Secondary | ICD-10-CM | POA: Diagnosis not present

## 2017-06-21 DIAGNOSIS — E119 Type 2 diabetes mellitus without complications: Secondary | ICD-10-CM | POA: Diagnosis not present

## 2017-06-21 DIAGNOSIS — E876 Hypokalemia: Secondary | ICD-10-CM | POA: Diagnosis not present

## 2017-06-21 DIAGNOSIS — M5416 Radiculopathy, lumbar region: Secondary | ICD-10-CM | POA: Diagnosis not present

## 2017-06-21 DIAGNOSIS — E559 Vitamin D deficiency, unspecified: Secondary | ICD-10-CM | POA: Diagnosis not present

## 2017-06-21 DIAGNOSIS — E785 Hyperlipidemia, unspecified: Secondary | ICD-10-CM | POA: Diagnosis not present

## 2017-06-22 DIAGNOSIS — M79671 Pain in right foot: Secondary | ICD-10-CM | POA: Diagnosis not present

## 2017-06-22 DIAGNOSIS — M109 Gout, unspecified: Secondary | ICD-10-CM | POA: Diagnosis not present

## 2017-06-25 DIAGNOSIS — M5416 Radiculopathy, lumbar region: Secondary | ICD-10-CM | POA: Diagnosis not present

## 2017-07-01 DIAGNOSIS — M5416 Radiculopathy, lumbar region: Secondary | ICD-10-CM | POA: Diagnosis not present

## 2017-07-03 ENCOUNTER — Other Ambulatory Visit: Payer: Self-pay | Admitting: Cardiovascular Disease

## 2017-07-04 DIAGNOSIS — M5416 Radiculopathy, lumbar region: Secondary | ICD-10-CM | POA: Diagnosis not present

## 2017-07-04 DIAGNOSIS — E119 Type 2 diabetes mellitus without complications: Secondary | ICD-10-CM | POA: Diagnosis not present

## 2017-07-04 NOTE — Telephone Encounter (Signed)
Pt's pharmacy is requesting a refill on potassium. This medication is no longer on pt's medication list and I do not see where that doctor D/C this medication. Please address

## 2017-07-04 NOTE — Telephone Encounter (Signed)
Called Brittney Fox and Brittney Fox stated that she still takes the potassium and that she prefers that powder and would like for Dr. Burt Knack to send in the powder instead because Brittney Fox has problems, swallowing the pills. Please address

## 2017-07-08 DIAGNOSIS — M5416 Radiculopathy, lumbar region: Secondary | ICD-10-CM | POA: Diagnosis not present

## 2017-07-09 ENCOUNTER — Encounter: Payer: Self-pay | Admitting: Physician Assistant

## 2017-07-09 NOTE — Telephone Encounter (Signed)
Patient reports she only takes potassium supplements "when she needs it" - when she feels lightheaded or starts to bruise easily. She requests a Rx for powdered K since she cannot swallow larger pills. Informed her that K has not been in her medication list for over a year and there are no recent lab results (last K in system drawn 06/2015).  Patient will come in tomorrow for 1 year follow-up with B. Bhagat. She understands K refill will be discussed at that time and labs will be drawn.  She has OV scheduled with Dr. Burt Knack in June. This may be rescheduled if needed after evaluation tomorrow.

## 2017-07-09 NOTE — Telephone Encounter (Signed)
Left message to call back to discuss medications.

## 2017-07-10 ENCOUNTER — Encounter: Payer: Self-pay | Admitting: Physician Assistant

## 2017-07-10 ENCOUNTER — Ambulatory Visit (INDEPENDENT_AMBULATORY_CARE_PROVIDER_SITE_OTHER): Payer: Medicare Other | Admitting: Physician Assistant

## 2017-07-10 VITALS — BP 126/80 | HR 89 | Ht 65.0 in | Wt 178.0 lb

## 2017-07-10 DIAGNOSIS — I272 Pulmonary hypertension, unspecified: Secondary | ICD-10-CM

## 2017-07-10 DIAGNOSIS — I5032 Chronic diastolic (congestive) heart failure: Secondary | ICD-10-CM | POA: Diagnosis not present

## 2017-07-10 DIAGNOSIS — R600 Localized edema: Secondary | ICD-10-CM | POA: Diagnosis not present

## 2017-07-10 DIAGNOSIS — R252 Cramp and spasm: Secondary | ICD-10-CM | POA: Diagnosis not present

## 2017-07-10 MED ORDER — POTASSIUM CHLORIDE ER 10 MEQ PO CPCR: 20.0000 meq | ORAL_CAPSULE | Freq: Every day | ORAL | 0 refills | Status: DC

## 2017-07-10 NOTE — Progress Notes (Signed)
Cardiology Office Note    Date:  07/10/2017   ID:  Brittney Fox, DOB [birth year], MRN [id number]  PCP:  Velna Hatchet, MD  Cardiologist:  Dr. Burt Knack  Chief Complaint: medication refills   History of Present Illness:   Brittney Fox is a 77 y.o. female with history of hyperlipidemia, type 2 diabetes, leg edema from venous insufficiency and pulmonary hypertension presents for medication and yearly follow-up.  The patient is been seen with shortness of breath in the past. An echo study in 2015 showed mild pulmonary hypertension, normal LV function, and no significant diastolic dysfunction. There was no valvular disease identified. An exercise stress echo demonstrated no ischemic changes.   She was doing well on cardiac standpoint when last seen by Dr. Burt Knack 07/30/16.  Echocardiogram 07/2013 showed normal LV function with diastolic dysfunction.  Pulmonary pressures minimally increased to 34 mmHg.  Mild mitral regurgitation.  Encouraged salt avoidance.  Here today for follow up. She is a Cabin crew.  She walks about 10,000 steps every day without dyspnea or chest pain.  She has a chronic venous insufficiency.  Severe compression stockings every day.  Compliant with low-sodium diet.  Denies orthopnea, PND, syncope, melena or blood in his stool or urine.  She takes as needed potassium supplement for muscle cramps.  She cannot swallow pills.  Using potassium powder.  Past Medical History:  Diagnosis Date  . Anal fissure    as a child  . Asthma   . Diabetes mellitus    managed by diet and exercise  . Diverticulosis of colon (without mention of hemorrhage)   . Fatty liver   . Hyperlipidemia   . Irritable bowel syndrome   . Lymphocytic colitis    chronic  . Osteoarthritis   . Osteopenia 09/2007  . Other mucopurulent conjunctivitis   . Premature ventricular contractions   . Raynaud's syndrome   . Rosacea    opitcal  . Spinal stenosis     Past Surgical History:  Procedure  Laterality Date  . CATARACT EXTRACTION Bilateral   . CHOLECYSTECTOMY    . dental implants    . DILATION AND CURETTAGE OF UTERUS  1990  . FOOT SURGERY    . HIP SURGERY Bilateral 1973  . Jaw Implants  2007  . KNEE ARTHROSCOPY Right   . lens implant    . ovarian wedge resection  1964  . REPLACEMENT TOTAL KNEE Bilateral 2005, 2007  . TOTAL ABDOMINAL HYSTERECTOMY W/ BILATERAL SALPINGOOPHORECTOMY  1990   Excessive Bleeding   . TOTAL KNEE ARTHROPLASTY Left 08/2003  . TOTAL KNEE ARTHROPLASTY Right 08/2005    Current Medications: Prior to Admission medications   Medication Sig Start Date End Date Taking? Authorizing Provider  acetaminophen (TYLENOL) 650 MG CR tablet Take 650 mg by mouth every 8 (eight) hours as needed for pain.    [provider]  aspirin EC 81 MG tablet Take 1 tablet (81 mg total) by mouth daily. 07/30/16   [name], MD  atorvastatin (LIPITOR) 10 MG tablet TAKE ONE TABLET BY MOUTH ONCE DAILY Patient not taking: Reported on 05/10/2017 10/08/16   [name], MD  B Complex Vitamins (VITAMIN B COMPLEX PO) Take 1 tablet by mouth daily.     [provider]  chlorhexidine (PERIDEX) 0.12 % solution Use as directed 5 mLs in the mouth or throat as directed.  12/22/12   [provider]  Cholecalciferol (VITAMIN D-1000 MAX ST) 1000 units tablet Take 2,000 Units by mouth daily.  [provider]  diphenoxylate-atropine (LOMOTIL) 2.5-0.025 MG per tablet TAKE ONE (1) TABLET FOUR TIMES EACH DAY AS NEEDED FOR DIARRHEA 12/14/13   Lafayette Dragon, MD  estradiol (ESTRACE) 0.5 MG tablet Take 0.5 tablets (0.25 mg total) by mouth daily. 05/10/17   Megan Salon, MD  glucose blood ([facility] BLOOD GLUCOSE TEST STRIPS) test strip Use as instructed to test 2 times daily 12/12/12   [provider]  Probiotic Product (PROBIOTIC DAILY PO) Take 1 tablet by mouth daily.    [provider]  ranitidine (ZANTAC) 75 MG tablet Take 75 mg by mouth as needed.     [provider]  sitaGLIPtin (JANUVIA) 50 MG tablet Januvia 50 mg tablet 01/27/15   [provider]  spironolactone-hydrochlorothiazide (ALDACTAZIDE) 25-25 MG per tablet TAKE ONE TABLET EACH DAY 04/23/13   Swords, Darrick Penna, MD    Allergies:   Codeine phosphate; Morphine sulfate; and Penicillins   Social History   Socioeconomic History  . Marital status: Married    Spouse name: Not on file  . Number of children: 2  . Years of education: Not on file  . Highest education level: Not on file  Occupational History  . Occupation: REAL ESTATE    Employer: [employer]  Social Needs  . Financial resource strain: Not on file  . Food insecurity:    Worry: Not on file    Inability: Not on file  . Transportation needs:    Medical: Not on file    Non-medical: Not on file  Tobacco Use  . Smoking status: Former Smoker    Last attempt to quit: 10/22/1961    Years since quitting: 55.7  . Smokeless tobacco: Never Used  Substance and Sexual Activity  . Alcohol use: No    Alcohol/week: 0.0 oz  . Drug use: No  . Sexual activity: Never    Partners: Male    Birth control/protection: Surgical    Comment: hysterectomy  Lifestyle  . Physical activity:    Days per week: Not on file    Minutes per session: Not on file  . Stress: Not on file  Relationships  . Social connections:    Talks on phone: Not on file    Gets together: Not on file    Attends religious service: Not on file    Active member of club or organization: Not on file    Attends meetings of clubs or organizations: Not on file    Relationship status: Not on file  Other Topics Concern  . Not on file  Social History Narrative  . Not on file     Family History:  The patient's family history includes Breast cancer (age of onset: 87) in her sister; Diabetes in her paternal uncle; Heart disease in her father; Lung cancer in her sister; Osteoporosis in her mother; Ovarian cancer in her maternal  grandmother.   ROS:   Please see the history of present illness.    ROS All other systems reviewed and are negative.   PHYSICAL EXAM:   VS:  BP 126/80   Pulse 89   Ht 5\' 5"  (1.651 m)   Wt 178 lb (80.7 kg)   LMP 04/02/1988   SpO2 100%   BMI 29.62 kg/m    GEN: Well nourished, well developed, in no acute distress  HEENT: normal  Neck: no JVD, carotid bruits, or masses Cardiac: RRR; mild systoli murmurs, rubs, or gallops,no edema  Respiratory:  clear to auscultation bilaterally,  normal work of breathing GI: soft, nontender, nondistended, + BS MS: no deformity or atrophy  Skin: warm and dry, no rash Neuro:  Alert and Oriented x 3, Strength and sensation are intact Psych: euthymic mood, full affect  Wt Readings from Last 3 Encounters:  07/10/17 178 lb (80.7 kg)  05/10/17 180 lb 4 oz (81.8 kg)  07/30/16 177 lb 12.8 oz (80.6 kg)      Studies/Labs Reviewed:   EKG:  EKG is ordered today.  The ekg ordered today demonstrates normal sinus rhythm with nonspecific T wave inversion.  No significant changes from prior EKG.  Recent Labs: No results found for requested labs within last 8760 hours.   Lipid Panel    Component Value Date/Time   CHOL 144 09/13/2015 0734   TRIG 87 09/13/2015 0734   TRIG 88 02/06/2006 1028   HDL 59 09/13/2015 0734   CHOLHDL 2.4 09/13/2015 0734   VLDL 17 09/13/2015 0734   LDLCALC 68 09/13/2015 0734   LDLDIRECT 150.2 11/28/2010 1131    Additional studies/ records that were reviewed today include:   Echocardiogram: 08/14/16 Study Conclusions  - Left ventricle: The cavity size was normal. There was mild focal   basal hypertrophy of the septum. Systolic function was normal.   The estimated ejection fraction was in the range of 50% to 55%.   Wall motion was normal; there were no regional wall motion   abnormalities. Features are consistent with a pseudonormal left   ventricular filling pattern, with concomitant abnormal relaxation   and increased  filling pressure (grade 2 diastolic dysfunction). - Aortic valve: There was trivial regurgitation. - Mitral valve: Calcified annulus. There was mild regurgitation. - Atrial septum: There was a possible patent foramen ovale. - Pulmonary arteries: Systolic pressure was mildly increased. PA   peak pressure: 34 mm Hg (S).  Impressions:  - Normal LV systolic function; grade 2 diastolic dysfunction; trace   AI; mild MR; mild TR with mildly elevated pulmonary pressure.   ASSESSMENT & PLAN:    1. Chronic diastolic heart failure/bilateral lower extremity edema due to venous insufficiency -No dyspnea.  She is on Spironolactone/hydrochlorothiazide for chronic lower extremity edema.  No orthopnea or PND.  Continue compression stocking, leg elevation and low-sodium diet.  2.  Hyperlipidemia -Continue statin.  Followed by PCP.  3.  Muscle cramps -Uses as needed powder potassium supplement with resolved symptoms.  She cannot swallow pills.  She has been using powder once every few months for greater than 4 years.  If insurance does not cover K  Powder, she needs to follow-up with PCP to rule out any GI etiology for dysphagia. Check BMET and Mg today.   Medication Adjustments/Labs and Tests Ordered: Current medicines are reviewed at length with the patient today.  Concerns regarding medicines are outlined above.  Medication changes, Labs and Tests ordered today are listed in the Patient Instructions below. Patient Instructions  Medication Instructions: Start Potassium 20 meq ONCE A DAY AS NEEDED  Labwork: Your physician recommends that you have lab work today: BMET, MAG   Procedures/Testing: None ordered  Follow-Up: Keep Scheduled appointment with Dr. Burt Knack  Any Additional Special Instructions Will Be Listed Below (If Applicable).     If you need a refill on your cardiac medications before your next appointment, please call your pharmacy.      Jarrett Soho, [place]    07/10/2017 3:39 PM    [facility] [address], [place], [place]  [address]  Phone: [phone]; Fax: [phone]

## 2017-07-10 NOTE — Patient Instructions (Addendum)
Medication Instructions: Start Potassium 20 meq ONCE A DAY AS NEEDED  Labwork: Your physician recommends that you have lab work today: BMET, MAG   Procedures/Testing: None ordered  Follow-Up: Keep Scheduled appointment with Dr. Burt Knack  Any Additional Special Instructions Will Be Listed Below (If Applicable).     If you need a refill on your cardiac medications before your next appointment, please call your pharmacy.

## 2017-07-11 ENCOUNTER — Telehealth: Payer: Self-pay | Admitting: Physician Assistant

## 2017-07-11 LAB — MAGNESIUM: Magnesium: 1.7 mg/dL (ref 1.6–2.3)

## 2017-07-11 LAB — BASIC METABOLIC PANEL
BUN/Creatinine Ratio: 16 (ref 12–28)
BUN: 10 mg/dL (ref 8–27)
CALCIUM: 10 mg/dL (ref 8.7–10.3)
CO2: 29 mmol/L (ref 20–29)
Chloride: 92 mmol/L — ABNORMAL LOW (ref 96–106)
Creatinine, Ser: 0.62 mg/dL (ref 0.57–1.00)
GFR calc Af Amer: 101 mL/min/{1.73_m2} (ref >59)
GFR, EST NON AFRICAN AMERICAN: 88 mL/min/{1.73_m2} (ref >59)
Glucose: 135 mg/dL — ABNORMAL HIGH (ref 65–99)
Potassium: 3.7 mmol/L (ref 3.5–5.2)
SODIUM: 138 mmol/L (ref 134–144)

## 2017-07-11 MED ORDER — POTASSIUM CHLORIDE ER 10 MEQ PO TBCR: 20.0000 meq | EXTENDED_RELEASE_TABLET | Freq: Every day | ORAL | 3 refills | Status: DC

## 2017-07-11 NOTE — Telephone Encounter (Signed)
New message    Pt c/o medication issue:  1. Name of Medication: potassium chloride (MICRO-K) 10 MEQ CR capsule  2. How are you currently taking this medication (dosage and times per day)? Take 2 capsules (20 mEq total) by mouth daily.  3. Are you having a reaction (difficulty breathing--STAT)? No  4. What is your medication issue? Pills too large for patient to swallow, please call with verbal to change to tablet

## 2017-07-11 NOTE — Progress Notes (Signed)
Pt has been made aware of normal result and verbalized understanding.  jw 07/11/17

## 2017-07-18 NOTE — Addendum Note (Signed)
Addended by: Sandrea Hammond D on: 07/18/2017 03:04 PM   Modules accepted: Orders

## 2017-07-31 ENCOUNTER — Other Ambulatory Visit: Payer: Self-pay | Admitting: Podiatry

## 2017-07-31 ENCOUNTER — Ambulatory Visit (INDEPENDENT_AMBULATORY_CARE_PROVIDER_SITE_OTHER): Payer: Medicare Other

## 2017-07-31 ENCOUNTER — Encounter: Payer: Self-pay | Admitting: Podiatry

## 2017-07-31 ENCOUNTER — Ambulatory Visit (INDEPENDENT_AMBULATORY_CARE_PROVIDER_SITE_OTHER): Payer: Medicare Other | Admitting: Podiatry

## 2017-07-31 DIAGNOSIS — M2042 Other hammer toe(s) (acquired), left foot: Secondary | ICD-10-CM | POA: Diagnosis not present

## 2017-07-31 DIAGNOSIS — M79672 Pain in left foot: Secondary | ICD-10-CM

## 2017-07-31 DIAGNOSIS — L84 Corns and callosities: Secondary | ICD-10-CM

## 2017-08-01 NOTE — Progress Notes (Signed)
Subjective:   Patient ID: Brittney Fox, female   DOB: 77 y.o.   MRN: [id number]   HPI Patient presents complaining of a traumatized fifth digit left with probable fracture of the toe and is noted to have keratotic lesion sub-left foot that become very painful when pressed.  Patient states that she did the toe about 3 months ago and it is still sore   ROS      Objective:  Physical Exam  Neurovascular status intact with patient found to have swollen fifth digit left and is noted to have keratotic lesion plantar aspect left foot with lucent core that is painful when palpated from direct palpation.  Patient has good digital perfusion well oriented x3     Assessment:  Fracture digit fifth left is probable along with porokeratotic lesion plantar left     Plan:  H&P x-ray taken and today I have advised on wider shoes and that this should eventually heal.  I then went ahead and debrided the plantar lesion left with sharp instrumentation and applied medication to soften it and advised on leaving this on for 24 hours.  Reappoint to recheck as needed  X-ray indicates there is a fracture of the base of the fifth digit left which appears to be gradually healing but is in a difficult position

## 2017-08-02 DIAGNOSIS — M5137 Other intervertebral disc degeneration, lumbosacral region: Secondary | ICD-10-CM | POA: Diagnosis not present

## 2017-08-20 DIAGNOSIS — M5416 Radiculopathy, lumbar region: Secondary | ICD-10-CM | POA: Diagnosis not present

## 2017-08-20 DIAGNOSIS — M48061 Spinal stenosis, lumbar region without neurogenic claudication: Secondary | ICD-10-CM | POA: Diagnosis not present

## 2017-09-23 ENCOUNTER — Encounter: Payer: Self-pay | Admitting: Cardiovascular Disease

## 2017-09-23 ENCOUNTER — Ambulatory Visit (INDEPENDENT_AMBULATORY_CARE_PROVIDER_SITE_OTHER): Payer: Medicare Other | Admitting: Cardiovascular Disease

## 2017-09-23 VITALS — BP 124/70 | HR 52 | Ht 65.0 in | Wt 180.6 lb

## 2017-09-23 DIAGNOSIS — I872 Venous insufficiency (chronic) (peripheral): Secondary | ICD-10-CM | POA: Diagnosis not present

## 2017-09-23 NOTE — Progress Notes (Signed)
Cardiology Office Note Date:  09/23/2017   ID:  Brittney Fox, DOB 07-04-1940, MRN [id number]  PCP:  Velna Hatchet, MD  Cardiologist:  [name], MD    Chief Complaint  Patient presents with  . Shortness of Breath     History of Present Illness: Brittney Fox is a 77 y.o. female who presents for follow-up of shortness of breath.  Patient has a history of type 2 diabetes, venous insufficiency and diastolic dysfunction identified by echo.  She is here alone today.  Her breathing is stable and she has mild shortness of breath with activity.  She denies orthopnea, PND, heart palpitations, or chest pain.  She complains of chronic leg swelling which is her biggest problem at present.  She has trouble with compression stockings.  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   Past Medical History:  Diagnosis Date  . Anal fissure    as a child  . Asthma   . Diabetes mellitus    managed by diet and exercise  . Diverticulosis of colon (without mention of hemorrhage)   . Fatty liver   . Hyperlipidemia   . Irritable bowel syndrome   . Lymphocytic colitis    chronic  . Osteoarthritis   . Osteopenia 09/2007  . Other mucopurulent conjunctivitis   . Premature ventricular contractions   . Raynaud's syndrome   . Rosacea    opitcal  . Spinal stenosis     Past Surgical History:  Procedure Laterality Date  . CATARACT EXTRACTION Bilateral   . CHOLECYSTECTOMY    . dental implants    . DILATION AND CURETTAGE OF UTERUS  1990  . FOOT SURGERY    . HIP SURGERY Bilateral 1973  . Jaw Implants  2007  . KNEE ARTHROSCOPY Right   . lens implant    . ovarian wedge resection  1964  . REPLACEMENT TOTAL KNEE Bilateral 2005, 2007  . TOTAL ABDOMINAL HYSTERECTOMY W/ BILATERAL SALPINGOOPHORECTOMY  1990   Excessive Bleeding   . TOTAL KNEE ARTHROPLASTY Left 08/2003  . TOTAL KNEE ARTHROPLASTY Right 08/2005    Current Outpatient  Medications  Medication Sig Dispense Refill  . aspirin EC 81 MG tablet Take 1 tablet (81 mg total) by mouth daily.    . B Complex Vitamins (VITAMIN B COMPLEX PO) Take 1 tablet by mouth daily.     . chlorhexidine (PERIDEX) 0.12 % solution Use as directed 5 mLs in the mouth or throat as directed.     . Cholecalciferol (VITAMIN D-1000 MAX ST) 1000 units tablet Take 2,000 Units by mouth daily.     . diphenoxylate-atropine (LOMOTIL) 2.5-0.025 MG per tablet TAKE ONE (1) TABLET FOUR TIMES EACH DAY AS NEEDED FOR DIARRHEA 30 tablet 0  . estradiol (ESTRACE) 0.5 MG tablet Take 0.5 tablets (0.25 mg total) by mouth daily. 90 tablet 4  . glucose blood ([facility]S BLOOD GLUCOSE TEST STRIPS) test strip Use as instructed to test 2 times daily    . naproxen sodium (ALEVE) 220 MG tablet Take 220 mg by mouth as directed.    . potassium chloride (K-DUR) 10 MEQ tablet Take 2 tablets (20 mEq total) by mouth daily. 90 tablet 3  . Probiotic Product (PROBIOTIC DAILY PO) Take 1 tablet by mouth daily.    . ranitidine (ZANTAC) 75 MG tablet Take 75 mg by mouth as needed.    . sitaGLIPtin (JANUVIA) 50 MG tablet Januvia 50  mg tablet    . spironolactone-hydrochlorothiazide (ALDACTAZIDE) 25-25 MG per tablet TAKE ONE TABLET EACH DAY 30 tablet 5   No current facility-administered medications for this visit.     Allergies:   Codeine phosphate; Morphine sulfate; and Penicillins   Social History:  The patient  reports that she quit smoking about 55 years ago. She has never used smokeless tobacco. She reports that she does not drink alcohol or use drugs.   Family History:  The patient's  family history includes Breast cancer (age of onset: 69) in her sister; Diabetes in her paternal uncle; Heart disease in her father; Lung cancer in her sister; Osteoporosis in her mother; Ovarian cancer in her maternal grandmother.    ROS:  Please see the history of present illness.  Otherwise, review of systems is positive for back pain, muscle pain,  easy bruising.  All other systems are reviewed and negative.    PHYSICAL EXAM: VS:  BP 124/70   Pulse (!) 52   Ht 5\' 5"  (1.651 m)   Wt 180 lb 9.6 oz (81.9 kg)   LMP 04/02/1988   SpO2 99%   BMI 30.05 kg/m  , BMI Body mass index is 30.05 kg/m. GEN: Well nourished, well developed, in no acute distress  HEENT: normal  Neck: no JVD, no masses. No carotid bruits Cardiac: RRR without murmur or gallop                Respiratory:  clear to auscultation bilaterally, normal work of breathing GI: soft, nontender, nondistended, + BS MS: no deformity or atrophy  Ext: 1+ bilateral pretibial edema, pedal pulses 2+= bilaterally Skin: warm and dry, no rash Neuro:  Strength and sensation are intact Psych: euthymic mood, full affect  EKG:  EKG is not ordered today.  Recent Labs: 07/10/2017: BUN 10; Creatinine, Ser 0.62; Magnesium 1.7; Potassium 3.7; Sodium 138   Lipid Panel     Component Value Date/Time   CHOL 144 09/13/2015 0734   TRIG 87 09/13/2015 0734   TRIG 88 02/06/2006 1028   HDL 59 09/13/2015 0734   CHOLHDL 2.4 09/13/2015 0734   VLDL 17 09/13/2015 0734   LDLCALC 68 09/13/2015 0734   LDLDIRECT 150.2 11/28/2010 1131      Wt Readings from Last 3 Encounters:  09/23/17 180 lb 9.6 oz (81.9 kg)  07/10/17 178 lb (80.7 kg)  05/10/17 180 lb 4 oz (81.8 kg)    ASSESSMENT AND PLAN: 1.  Chronic leg edema: Reviewed suggestions for leg elevation and discussed the lounge doctor device.  Suspect her symptoms are primarily related to venous insufficiency.  Will refer to vascular surgery for evaluation.  2.  Shortness of breath: Noted to have diastolic dysfunction by echo.  Symptoms are quite stable.  She continues on Spironolactone and hydrochlorothiazide.  Current medicines are reviewed with the patient today.  The patient does not have concerns regarding medicines.  Labs/ tests ordered today include:  No orders of the defined types were placed in this encounter.   Disposition:   FU one  year  Signed, [name], MD  09/23/2017 8:34 AM    [facility] [address], [place], [place]  [address] Phone: [phone]; Fax: [phone]

## 2017-09-23 NOTE — Patient Instructions (Signed)
Medication Instructions:  Your provider recommends that you continue on your current medications as directed. Please refer to the Current Medication list given to you today.    Labwork: None  Testing/Procedures: None  Follow-Up: You have been referred to [facility] ([facility]) for venous insufficiency.   Your provider wants you to follow-up in: 1 year with Dr. Burt Knack. You will receive a reminder letter in the mail two months in advance. If you don't receive a letter, please call our office to schedule the follow-up appointment.    Any Other Special Instructions Will Be Listed Below (If Applicable).     For your  leg edema you  should do  the following 1. Leg elevation - I recommend the Lounge Dr. Leg rest.  See below for details  2. Salt restriction  -  Use potassium chloride instead of regular salt as a salt substitute. 3. Walk regularly 4. Compression hose - [facility] 5. Weight loss    Available on [email or web address] Or  Go to [email or web address]

## 2017-09-26 ENCOUNTER — Encounter: Payer: Self-pay | Admitting: Cardiovascular Disease

## 2017-09-27 ENCOUNTER — Other Ambulatory Visit: Payer: Self-pay

## 2017-09-27 DIAGNOSIS — I872 Venous insufficiency (chronic) (peripheral): Secondary | ICD-10-CM

## 2017-10-15 DIAGNOSIS — E7849 Other hyperlipidemia: Secondary | ICD-10-CM | POA: Diagnosis not present

## 2017-10-15 DIAGNOSIS — I10 Essential (primary) hypertension: Secondary | ICD-10-CM | POA: Diagnosis not present

## 2017-10-15 DIAGNOSIS — E119 Type 2 diabetes mellitus without complications: Secondary | ICD-10-CM | POA: Diagnosis not present

## 2017-10-15 DIAGNOSIS — R82998 Other abnormal findings in urine: Secondary | ICD-10-CM | POA: Diagnosis not present

## 2017-10-15 DIAGNOSIS — E559 Vitamin D deficiency, unspecified: Secondary | ICD-10-CM | POA: Diagnosis not present

## 2017-10-18 DIAGNOSIS — Z1212 Encounter for screening for malignant neoplasm of rectum: Secondary | ICD-10-CM | POA: Diagnosis not present

## 2017-10-22 DIAGNOSIS — I8390 Asymptomatic varicose veins of unspecified lower extremity: Secondary | ICD-10-CM | POA: Diagnosis not present

## 2017-10-22 DIAGNOSIS — R5383 Other fatigue: Secondary | ICD-10-CM | POA: Diagnosis not present

## 2017-10-22 DIAGNOSIS — G4709 Other insomnia: Secondary | ICD-10-CM | POA: Diagnosis not present

## 2017-10-22 DIAGNOSIS — Z6379 Other stressful life events affecting family and household: Secondary | ICD-10-CM | POA: Diagnosis not present

## 2017-10-22 DIAGNOSIS — Z1389 Encounter for screening for other disorder: Secondary | ICD-10-CM | POA: Diagnosis not present

## 2017-10-22 DIAGNOSIS — Z0000 Encounter for general adult medical examination without abnormal findings: Secondary | ICD-10-CM | POA: Diagnosis not present

## 2017-10-22 DIAGNOSIS — I10 Essential (primary) hypertension: Secondary | ICD-10-CM | POA: Diagnosis not present

## 2017-10-22 DIAGNOSIS — E7849 Other hyperlipidemia: Secondary | ICD-10-CM | POA: Diagnosis not present

## 2017-10-22 DIAGNOSIS — E559 Vitamin D deficiency, unspecified: Secondary | ICD-10-CM | POA: Diagnosis not present

## 2017-10-22 DIAGNOSIS — M5416 Radiculopathy, lumbar region: Secondary | ICD-10-CM | POA: Diagnosis not present

## 2017-10-22 DIAGNOSIS — M859 Disorder of bone density and structure, unspecified: Secondary | ICD-10-CM | POA: Diagnosis not present

## 2017-10-22 DIAGNOSIS — Z6830 Body mass index (BMI) 30.0-30.9, adult: Secondary | ICD-10-CM | POA: Diagnosis not present

## 2017-11-01 DIAGNOSIS — M5126 Other intervertebral disc displacement, lumbar region: Secondary | ICD-10-CM | POA: Diagnosis not present

## 2017-11-01 DIAGNOSIS — M5137 Other intervertebral disc degeneration, lumbosacral region: Secondary | ICD-10-CM | POA: Diagnosis not present

## 2017-11-29 ENCOUNTER — Encounter: Payer: Medicare Other | Admitting: Vascular Surgery

## 2017-11-29 ENCOUNTER — Encounter (HOSPITAL_COMMUNITY): Payer: Medicare Other

## 2017-12-20 ENCOUNTER — Other Ambulatory Visit: Payer: Self-pay

## 2017-12-20 ENCOUNTER — Ambulatory Visit (INDEPENDENT_AMBULATORY_CARE_PROVIDER_SITE_OTHER): Payer: Medicare Other | Admitting: Vascular Surgery

## 2017-12-20 ENCOUNTER — Ambulatory Visit (HOSPITAL_COMMUNITY)
Admission: RE | Admit: 2017-12-20 | Discharge: 2017-12-20 | Disposition: A | Discharge status: Home / Self Care | Payer: Medicare Other | Source: Ambulatory Visit | Attending: Vascular Surgery | Admitting: Vascular Surgery

## 2017-12-20 ENCOUNTER — Encounter: Payer: Self-pay | Admitting: Vascular Surgery

## 2017-12-20 VITALS — BP 129/78 | HR 63 | Temp 97.1°F | Resp 20 | Ht 65.0 in | Wt 179.0 lb

## 2017-12-20 DIAGNOSIS — I872 Venous insufficiency (chronic) (peripheral): Secondary | ICD-10-CM

## 2017-12-20 DIAGNOSIS — M7989 Other specified soft tissue disorders: Secondary | ICD-10-CM | POA: Insufficient documentation

## 2017-12-20 DIAGNOSIS — R609 Edema, unspecified: Secondary | ICD-10-CM | POA: Insufficient documentation

## 2017-12-20 NOTE — Progress Notes (Signed)
Patient ID: Brittney Fox, female   DOB: 08-08-1940, 77 y.o.   MRN: [id number]  Reason for Consult: New Patient (Initial Visit) (Ref by Dr. Burt Knack.  Venous Insuff.  Swelling mostly in R ankle.  )   Referred by Velna Hatchet, MD  Subjective:     HPI:  Brittney Fox is a 77 y.o. female very pleasant female percents for evaluation bilateral lower extremity swelling right greater than left.  She has had bilateral knee replacements in the past this is contributed to her swelling as well as some numbness in her feet.  She has never had tissue loss or ulceration of her bilateral lower extremities.  She has no previous arterial vascular disease.  She does not wear compression stockings at this time.  Of the knee surgeries on her legs she is never had other lower extremity surgeries.  She does have varicose veins in her bilateral lower extremities but mostly spider veins.  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 Anal fissure    as a child  . Asthma   . Diabetes mellitus    managed by diet and exercise  . Diverticulosis of colon (without mention of hemorrhage)   . Fatty liver   . Hyperlipidemia   . Irritable bowel syndrome   . Lymphocytic colitis    chronic  . Osteoarthritis   . Osteopenia 09/2007  . Other mucopurulent conjunctivitis   . Premature ventricular contractions   . Raynaud's syndrome   . Rosacea    opitcal  . Spinal stenosis    Family History  Problem Relation Age of Onset  . Lung cancer Sister   . Osteoporosis Mother   . Heart disease Father   . Diabetes Paternal Uncle   . Ovarian cancer Maternal Grandmother   . Breast cancer Sister 6  . Colon cancer Neg Hx   . Stomach cancer Neg Hx    Past Surgical History:  Procedure Laterality Date  . CATARACT EXTRACTION Bilateral   . CHOLECYSTECTOMY    . dental implants    . DILATION AND CURETTAGE OF UTERUS  1990  . FOOT SURGERY    . HIP SURGERY Bilateral 1973  . Jaw  Implants  2007  . KNEE ARTHROSCOPY Right   . lens implant    . ovarian wedge resection  1964  . REPLACEMENT TOTAL KNEE Bilateral 2005, 2007  . TOTAL ABDOMINAL HYSTERECTOMY W/ BILATERAL SALPINGOOPHORECTOMY  1990   Excessive Bleeding   . TOTAL KNEE ARTHROPLASTY Left 08/2003  . TOTAL KNEE ARTHROPLASTY Right 08/2005    Short Social History:  Social History   Tobacco Use  . Smoking status: Former Smoker    Last attempt to quit: 10/22/1961    Years since quitting: 56.2  . Smokeless tobacco: Never Used  Substance Use Topics  . Alcohol use: No    Alcohol/week: 0.0 standard drinks    Allergies  Allergen Reactions  . Codeine Phosphate     REACTION: headache  . Morphine Sulfate     REACTION: tongue swelling    Current Outpatient Medications  Medication Sig Dispense Refill  . aspirin EC 81 MG tablet Take 1 tablet (81 mg total) by mouth daily.    . B Complex Vitamins (VITAMIN B COMPLEX PO) Take 1 tablet by mouth daily.     . chlorhexidine (PERIDEX) 0.12 % solution Use as directed 5 mLs in the mouth or throat as directed.     Marland Kitchen  Cholecalciferol (VITAMIN D-1000 MAX ST) 1000 units tablet Take 2,000 Units by mouth daily.     . diphenoxylate-atropine (LOMOTIL) 2.5-0.025 MG per tablet TAKE ONE (1) TABLET FOUR TIMES EACH DAY AS NEEDED FOR DIARRHEA 30 tablet 0  . estradiol (ESTRACE) 0.5 MG tablet Take 0.5 tablets (0.25 mg total) by mouth daily. 90 tablet 4  . glucose blood ([facility] BLOOD GLUCOSE TEST STRIPS) test strip Use as instructed to test 2 times daily    . naproxen sodium (ALEVE) 220 MG tablet Take 220 mg by mouth as directed.    . Probiotic Product (PROBIOTIC DAILY PO) Take 1 tablet by mouth daily.    . ranitidine (ZANTAC) 75 MG tablet Take 75 mg by mouth as needed.    . rosuvastatin (CRESTOR) 5 MG tablet Take 5 mg by mouth daily.    . sitaGLIPtin (JANUVIA) 50 MG tablet Januvia 50 mg tablet    . spironolactone-hydrochlorothiazide (ALDACTAZIDE) 25-25 MG per tablet TAKE ONE TABLET EACH DAY 30  tablet 5  . potassium chloride (K-DUR) 10 MEQ tablet Take 2 tablets (20 mEq total) by mouth daily. 90 tablet 3   No current facility-administered medications for this visit.     Review of Systems  Constitutional:  Constitutional negative. HENT: HENT negative.  Eyes: Eyes negative.  Respiratory: Respiratory negative.  Cardiovascular: Positive for leg swelling.  Musculoskeletal: Positive for leg pain.  Skin: Skin negative.  Neurological: Neurological negative. Psychiatric: Psychiatric negative.        Objective:  Objective   Vitals:   12/20/17 1414  BP: 129/78  Pulse: 63  Resp: 20  Temp: (!) 97.1 F (36.2 C)  TempSrc: Oral  SpO2: 99%  Weight: 179 lb (81.2 kg)  Height: 5\' 5"  (1.651 m)   Body mass index is 29.79 kg/m.  Physical Exam  Constitutional: She is oriented to person, place, and time. She appears well-developed.  HENT:  Head: Normocephalic.  Eyes: Pupils are equal, round, and reactive to light.  Neck: Normal range of motion. Neck supple.  Cardiovascular: Normal rate.  Pulses:      Radial pulses are 2+ on the right side, and 2+ on the left side.       Dorsalis pedis pulses are 2+ on the right side, and 2+ on the left side.       Posterior tibial pulses are 2+ on the right side, and 2+ on the left side.  Musculoskeletal: Normal range of motion. She exhibits edema.  Right leg is approximate 10% larger than left She has significant spider veins throughout her bilateral lower extremities below the knees  Neurological: She is alert and oriented to person, place, and time.  Skin: Skin is warm and dry.  Psychiatric: She has a normal mood and affect. Her behavior is normal. Judgment and thought content normal.    Data: I have independently interpreted her venous reflux study which demonstrates reflux in both of her greater saphenous veins on the right up to 5000 ms in the mid thigh and on the left 2105 ms of the saphenofemoral junction.  Vein on the right measures  0.79 cm at the junction on the left 0.77.     Assessment/Plan:     77 year old female with C3 venous disease and extensive spider veins and discomfort in the right lower extremity greater than left lower extremity with reflux in her bilateral greater saphenous veins that measure greater than 0.7 cm bilaterally.  I discussed with her possible intervention of her right greater saphenous vein at  least given that this is the most symptomatic leg but possibly the left in the future as well.  She understands that this will not cure all of her swelling but may help prevent ulceration in the future as well as skin changes.  She will need to begin wearing thigh-high compression stockings and we discussed this at length today.  She will follow-up in 3 months for evaluation and discussion of saphenous vein ablation with 1 of my partners Dr. Scot Dock or Dr. Oneida Alar.     Waynetta Sandy MD [facility]

## 2018-01-07 DIAGNOSIS — M7062 Trochanteric bursitis, left hip: Secondary | ICD-10-CM | POA: Diagnosis not present

## 2018-01-09 DIAGNOSIS — Z23 Encounter for immunization: Secondary | ICD-10-CM | POA: Diagnosis not present

## 2018-01-09 DIAGNOSIS — E119 Type 2 diabetes mellitus without complications: Secondary | ICD-10-CM | POA: Diagnosis not present

## 2018-02-05 DIAGNOSIS — M4726 Other spondylosis with radiculopathy, lumbar region: Secondary | ICD-10-CM | POA: Diagnosis not present

## 2018-02-05 DIAGNOSIS — M48062 Spinal stenosis, lumbar region with neurogenic claudication: Secondary | ICD-10-CM | POA: Diagnosis not present

## 2018-02-05 DIAGNOSIS — M4316 Spondylolisthesis, lumbar region: Secondary | ICD-10-CM | POA: Diagnosis not present

## 2018-02-05 DIAGNOSIS — M4156 Other secondary scoliosis, lumbar region: Secondary | ICD-10-CM | POA: Diagnosis not present

## 2018-02-07 ENCOUNTER — Other Ambulatory Visit: Payer: Self-pay | Admitting: Internal Medicine

## 2018-02-07 DIAGNOSIS — Z1231 Encounter for screening mammogram for malignant neoplasm of breast: Secondary | ICD-10-CM

## 2018-02-12 DIAGNOSIS — R350 Frequency of micturition: Secondary | ICD-10-CM | POA: Diagnosis not present

## 2018-02-14 DIAGNOSIS — M48062 Spinal stenosis, lumbar region with neurogenic claudication: Secondary | ICD-10-CM | POA: Diagnosis not present

## 2018-02-14 DIAGNOSIS — M48061 Spinal stenosis, lumbar region without neurogenic claudication: Secondary | ICD-10-CM | POA: Diagnosis not present

## 2018-02-14 DIAGNOSIS — M47816 Spondylosis without myelopathy or radiculopathy, lumbar region: Secondary | ICD-10-CM | POA: Diagnosis not present

## 2018-02-20 DIAGNOSIS — M549 Dorsalgia, unspecified: Secondary | ICD-10-CM | POA: Diagnosis not present

## 2018-02-20 DIAGNOSIS — M5135 Other intervertebral disc degeneration, thoracolumbar region: Secondary | ICD-10-CM | POA: Diagnosis not present

## 2018-02-20 DIAGNOSIS — M4316 Spondylolisthesis, lumbar region: Secondary | ICD-10-CM | POA: Diagnosis not present

## 2018-02-20 DIAGNOSIS — M5030 Other cervical disc degeneration, unspecified cervical region: Secondary | ICD-10-CM | POA: Diagnosis not present

## 2018-02-20 DIAGNOSIS — M48061 Spinal stenosis, lumbar region without neurogenic claudication: Secondary | ICD-10-CM | POA: Diagnosis not present

## 2018-03-04 DIAGNOSIS — M5417 Radiculopathy, lumbosacral region: Secondary | ICD-10-CM | POA: Diagnosis not present

## 2018-03-04 DIAGNOSIS — M4807 Spinal stenosis, lumbosacral region: Secondary | ICD-10-CM | POA: Diagnosis not present

## 2018-03-04 DIAGNOSIS — M48062 Spinal stenosis, lumbar region with neurogenic claudication: Secondary | ICD-10-CM | POA: Diagnosis not present

## 2018-03-04 DIAGNOSIS — M5416 Radiculopathy, lumbar region: Secondary | ICD-10-CM | POA: Diagnosis not present

## 2018-03-11 DIAGNOSIS — J329 Chronic sinusitis, unspecified: Secondary | ICD-10-CM | POA: Diagnosis not present

## 2018-03-11 DIAGNOSIS — I10 Essential (primary) hypertension: Secondary | ICD-10-CM | POA: Diagnosis not present

## 2018-03-11 DIAGNOSIS — Z6829 Body mass index (BMI) 29.0-29.9, adult: Secondary | ICD-10-CM | POA: Diagnosis not present

## 2018-03-18 DIAGNOSIS — M545 Low back pain: Secondary | ICD-10-CM | POA: Diagnosis not present

## 2018-03-18 DIAGNOSIS — E7849 Other hyperlipidemia: Secondary | ICD-10-CM | POA: Diagnosis not present

## 2018-03-18 DIAGNOSIS — I10 Essential (primary) hypertension: Secondary | ICD-10-CM | POA: Diagnosis not present

## 2018-03-18 DIAGNOSIS — Z6829 Body mass index (BMI) 29.0-29.9, adult: Secondary | ICD-10-CM | POA: Diagnosis not present

## 2018-03-18 DIAGNOSIS — E876 Hypokalemia: Secondary | ICD-10-CM | POA: Diagnosis not present

## 2018-03-18 DIAGNOSIS — E1165 Type 2 diabetes mellitus with hyperglycemia: Secondary | ICD-10-CM | POA: Diagnosis not present

## 2018-03-18 DIAGNOSIS — M859 Disorder of bone density and structure, unspecified: Secondary | ICD-10-CM | POA: Diagnosis not present

## 2018-03-18 DIAGNOSIS — R252 Cramp and spasm: Secondary | ICD-10-CM | POA: Diagnosis not present

## 2018-03-18 DIAGNOSIS — E559 Vitamin D deficiency, unspecified: Secondary | ICD-10-CM | POA: Diagnosis not present

## 2018-03-18 DIAGNOSIS — J328 Other chronic sinusitis: Secondary | ICD-10-CM | POA: Diagnosis not present

## 2018-03-20 DIAGNOSIS — M5416 Radiculopathy, lumbar region: Secondary | ICD-10-CM | POA: Diagnosis not present

## 2018-03-21 ENCOUNTER — Ambulatory Visit
Admission: RE | Admit: 2018-03-21 | Discharge: 2018-03-21 | Disposition: A | Discharge status: Home / Self Care | Payer: Medicare Other | Source: Ambulatory Visit | Attending: Internal Medicine | Admitting: Internal Medicine

## 2018-03-21 DIAGNOSIS — Z1231 Encounter for screening mammogram for malignant neoplasm of breast: Secondary | ICD-10-CM

## 2018-03-25 DIAGNOSIS — M5416 Radiculopathy, lumbar region: Secondary | ICD-10-CM | POA: Diagnosis not present

## 2018-03-25 DIAGNOSIS — M545 Low back pain: Secondary | ICD-10-CM | POA: Diagnosis not present

## 2018-03-27 DIAGNOSIS — M545 Low back pain: Secondary | ICD-10-CM | POA: Diagnosis not present

## 2018-04-01 DIAGNOSIS — M545 Low back pain: Secondary | ICD-10-CM | POA: Diagnosis not present

## 2018-04-03 DIAGNOSIS — M545 Low back pain: Secondary | ICD-10-CM | POA: Diagnosis not present

## 2018-04-09 DIAGNOSIS — M542 Cervicalgia: Secondary | ICD-10-CM | POA: Diagnosis not present

## 2018-04-09 DIAGNOSIS — Z6829 Body mass index (BMI) 29.0-29.9, adult: Secondary | ICD-10-CM | POA: Diagnosis not present

## 2018-04-10 ENCOUNTER — Encounter: Payer: Self-pay | Admitting: Vascular Surgery

## 2018-04-10 ENCOUNTER — Ambulatory Visit (INDEPENDENT_AMBULATORY_CARE_PROVIDER_SITE_OTHER): Payer: Medicare Other | Admitting: Vascular Surgery

## 2018-04-10 ENCOUNTER — Other Ambulatory Visit: Payer: Self-pay

## 2018-04-10 VITALS — BP 122/72 | HR 75 | Temp 96.5°F | Resp 16 | Ht 65.5 in | Wt 175.0 lb

## 2018-04-10 DIAGNOSIS — I872 Venous insufficiency (chronic) (peripheral): Secondary | ICD-10-CM

## 2018-04-10 DIAGNOSIS — M5416 Radiculopathy, lumbar region: Secondary | ICD-10-CM | POA: Diagnosis not present

## 2018-04-10 DIAGNOSIS — M545 Low back pain: Secondary | ICD-10-CM | POA: Diagnosis not present

## 2018-04-10 NOTE — Progress Notes (Signed)
Patient name: Brittney Fox MRN: [id number] DOB: 01-Jan-1941 Sex: female  REASON FOR VISIT:   Follow-up of chronic venous insufficiency.  HPI:   Brittney Fox is a pleasant 78 y.o. female who was seen by Dr. Servando Snare on 12/20/2017.  She presented with bilateral lower extremity swelling which is more significant on the right side.  At the time of that visit the right leg was approximately 10% larger than the left.  She had significant spider veins throughout both lower extremities below the knees.  She had a venous reflux study which demonstrated reflux in both of her great saphenous veins.  The veins were dilated.  She was placed in thigh-high compression stockings with a gradient of 20 to 30 mmHg.  She was to elevate her legs and take ibuprofen as needed for pain.  She presents for 16-month follow-up visit.  Since she was seen in our office last, she has been wearing her compression stockings without much relief of her symptoms.  She experiences significant aching pain and heaviness in both legs but especially on the right.  The symptoms are aggravated by sitting and standing and relieved with elevation.  She does elevate her legs which help.  She takes ibuprofen as needed for pain.  She has no previous history of DVT or phlebitis.  Past Medical History:  Diagnosis Date  . Anal fissure    as a child  . Asthma   . Diabetes mellitus    managed by diet and exercise  . Diverticulosis of colon (without mention of hemorrhage)   . Fatty liver   . Hyperlipidemia   . Irritable bowel syndrome   . Lymphocytic colitis    chronic  . Osteoarthritis   . Osteopenia 09/2007  . Other mucopurulent conjunctivitis   . Premature ventricular contractions   . Raynaud's syndrome   . Rosacea    opitcal  . Spinal stenosis     Family History  Problem Relation Age of Onset  . Lung cancer Sister   . Osteoporosis Mother   . Heart disease Father   . Diabetes Paternal Uncle   . Ovarian  cancer Maternal Grandmother   . Breast cancer Sister 56  . Colon cancer Neg Hx   . Stomach cancer Neg Hx     SOCIAL HISTORY: Social History   Tobacco Use  . Smoking status: Former Smoker    Last attempt to quit: 10/22/1961    Years since quitting: 56.5  . Smokeless tobacco: Never Used  Substance Use Topics  . Alcohol use: No    Alcohol/week: 0.0 standard drinks    Allergies  Allergen Reactions  . Codeine Phosphate     REACTION: headache  . Morphine Sulfate     REACTION: tongue swelling    Current Outpatient Medications  Medication Sig Dispense Refill  . aspirin EC 81 MG tablet Take 1 tablet (81 mg total) by mouth daily.    . B Complex Vitamins (VITAMIN B COMPLEX PO) Take 1 tablet by mouth daily.     . chlorhexidine (PERIDEX) 0.12 % solution Use as directed 5 mLs in the mouth or throat as directed.     . Cholecalciferol (VITAMIN D-1000 MAX ST) 1000 units tablet Take 2,000 Units by mouth daily.     . diphenoxylate-atropine (LOMOTIL) 2.5-0.025 MG per tablet TAKE ONE (1) TABLET FOUR TIMES EACH DAY AS NEEDED FOR DIARRHEA 30 tablet 0  . estradiol (ESTRACE) 0.5 MG tablet Take 0.5 tablets (0.25 mg total) by mouth  daily. 90 tablet 4  . glucose blood (CVS BLOOD GLUCOSE TEST STRIPS) test strip Use as instructed to test 2 times daily    . naproxen sodium (ALEVE) 220 MG tablet Take 220 mg by mouth as directed.    . Probiotic Product (PROBIOTIC DAILY PO) Take 1 tablet by mouth daily.    . rosuvastatin (CRESTOR) 5 MG tablet Take 5 mg by mouth daily.    . sitaGLIPtin (JANUVIA) 50 MG tablet Januvia 50 mg tablet    . spironolactone-hydrochlorothiazide (ALDACTAZIDE) 25-25 MG per tablet TAKE ONE TABLET EACH DAY 30 tablet 5  . potassium chloride (K-DUR) 10 MEQ tablet Take 2 tablets (20 mEq total) by mouth daily. 90 tablet 3  . ranitidine (ZANTAC) 75 MG tablet Take 75 mg by mouth as needed.     No current facility-administered medications for this visit.     REVIEW OF SYSTEMS:  [X]  denotes  positive finding, [ ]  denotes negative finding Cardiac  Comments:  Chest pain or chest pressure:    Shortness of breath upon exertion:    Short of breath when lying flat:    Irregular heart rhythm:        Vascular    Pain in calf, thigh, or hip brought on by ambulation:    Pain in feet at night that wakes you up from your sleep:     Blood clot in your veins:    Leg swelling:  x       Pulmonary    Oxygen at home:    Productive cough:     Wheezing:         Neurologic    Sudden weakness in arms or legs:     Sudden numbness in arms or legs:     Sudden onset of difficulty speaking or slurred speech:    Temporary loss of vision in one eye:     Problems with dizziness:         Gastrointestinal    Blood in stool:     Vomited blood:         Genitourinary    Burning when urinating:     Blood in urine:        Psychiatric    Major depression:         Hematologic    Bleeding problems:    Problems with blood clotting too easily:        Skin    Rashes or ulcers:        Constitutional    Fever or chills:     PHYSICAL EXAM:   Vitals:   04/10/18 1424  BP: 122/72  Pulse: 75  Resp: 16  Temp: (!) 96.5 F (35.8 C)  TempSrc: Oral  SpO2: 98%  Weight: 175 lb (79.4 kg)  Height: 5' 5.5" (1.664 m)    GENERAL: The patient is a well-nourished female, in no acute distress. The vital signs are documented above. CARDIAC: There is a regular rate and rhythm.  VASCULAR:  ARTERIAL: I do not detect carotid bruits. She has palpable pedal pulses bilaterally. VENOUS: She has bilateral lower extremity swelling. She has telangiectasias of both thighs and legs bilaterally. She has some hyperpigmentation. There are no significant large truncal varicosities. I did look at her right great saphenous vein myself with the SonoSite and the vein has significant reflux and is significantly dilated.  It does feed several tributaries which cannot be seen on physical exam. PULMONARY: There is good air  exchange bilaterally without wheezing or  rales. ABDOMEN: Soft and non-tender with normal pitched bowel sounds.  MUSCULOSKELETAL: There are no major deformities or cyanosis. NEUROLOGIC: No focal weakness or paresthesias are detected. SKIN: There are no ulcers or rashes noted. PSYCHIATRIC: The patient has a normal affect.  DATA:    VENOUS DUPLEX: I did review her previous venous duplex scan that was done in September.  This shows significant reflux in the right great saphenous vein which is significantly dilated.  There is no significant deep venous reflux on the right.  On the left side there was only reflux at the saphenofemoral junction.  MEDICAL ISSUES:   CHRONIC VENOUS INSUFFICIENCY: This patient has CEAP C-4 venous disease.  I have encouraged her to continue to wear her thigh-high compression stockings and elevate her legs.  We discussed the most effective position for this.  I encouraged her to exercise and avoid prolonged sitting and standing.  Currently she thinks that her symptoms are tolerable.  Certainly if her symptoms progress I think she would be a good candidate for endovenous laser ablation of the right great saphenous vein.  She will call if her symptoms progress.  Currently she has to spend a lot of time taking care of her husband and is reluctant to consider a procedure right now as this may interfere with his care.  A total of 35 minutes was spent on this visit. 20 minutes was face to face time. More than 50% of the time was spent on counseling and coordinating with the patient.    Deitra Mayo [facility] [phone]

## 2018-04-15 DIAGNOSIS — M5416 Radiculopathy, lumbar region: Secondary | ICD-10-CM | POA: Diagnosis not present

## 2018-04-17 DIAGNOSIS — M5416 Radiculopathy, lumbar region: Secondary | ICD-10-CM | POA: Diagnosis not present

## 2018-04-21 DIAGNOSIS — M722 Plantar fascial fibromatosis: Secondary | ICD-10-CM | POA: Diagnosis not present

## 2018-04-21 DIAGNOSIS — M79671 Pain in right foot: Secondary | ICD-10-CM | POA: Diagnosis not present

## 2018-04-21 DIAGNOSIS — M19079 Primary osteoarthritis, unspecified ankle and foot: Secondary | ICD-10-CM | POA: Diagnosis not present

## 2018-04-21 DIAGNOSIS — M25571 Pain in right ankle and joints of right foot: Secondary | ICD-10-CM | POA: Diagnosis not present

## 2018-04-22 DIAGNOSIS — M5416 Radiculopathy, lumbar region: Secondary | ICD-10-CM | POA: Diagnosis not present

## 2018-04-24 DIAGNOSIS — M5416 Radiculopathy, lumbar region: Secondary | ICD-10-CM | POA: Diagnosis not present

## 2018-04-29 DIAGNOSIS — M545 Low back pain: Secondary | ICD-10-CM | POA: Diagnosis not present

## 2018-05-12 DIAGNOSIS — M79671 Pain in right foot: Secondary | ICD-10-CM | POA: Diagnosis not present

## 2018-05-14 DIAGNOSIS — M79671 Pain in right foot: Secondary | ICD-10-CM | POA: Diagnosis not present

## 2018-05-20 DIAGNOSIS — M79671 Pain in right foot: Secondary | ICD-10-CM | POA: Diagnosis not present

## 2018-05-22 DIAGNOSIS — Z961 Presence of intraocular lens: Secondary | ICD-10-CM | POA: Diagnosis not present

## 2018-05-22 DIAGNOSIS — M79671 Pain in right foot: Secondary | ICD-10-CM | POA: Diagnosis not present

## 2018-05-22 DIAGNOSIS — E119 Type 2 diabetes mellitus without complications: Secondary | ICD-10-CM | POA: Diagnosis not present

## 2018-05-22 DIAGNOSIS — H26491 Other secondary cataract, right eye: Secondary | ICD-10-CM | POA: Diagnosis not present

## 2018-05-23 DIAGNOSIS — M722 Plantar fascial fibromatosis: Secondary | ICD-10-CM | POA: Diagnosis not present

## 2018-05-27 DIAGNOSIS — M79671 Pain in right foot: Secondary | ICD-10-CM | POA: Diagnosis not present

## 2018-05-27 DIAGNOSIS — M722 Plantar fascial fibromatosis: Secondary | ICD-10-CM | POA: Diagnosis not present

## 2018-05-29 DIAGNOSIS — M79671 Pain in right foot: Secondary | ICD-10-CM | POA: Diagnosis not present

## 2018-06-02 ENCOUNTER — Other Ambulatory Visit: Payer: Self-pay | Admitting: Obstetrics & Gynecology

## 2018-06-02 DIAGNOSIS — M722 Plantar fascial fibromatosis: Secondary | ICD-10-CM | POA: Diagnosis not present

## 2018-06-02 DIAGNOSIS — M25571 Pain in right ankle and joints of right foot: Secondary | ICD-10-CM | POA: Diagnosis not present

## 2018-06-02 NOTE — Telephone Encounter (Signed)
Medication refill request: estradiol  Last AEX:  05/10/17 Next AEX: 07/25/18 Last MMG (if hormonal medication request): 03/21/18 Bi-rads 1 Neg Refill authorized: #30 with 0 rf to get her to her AEX

## 2018-06-05 DIAGNOSIS — M79671 Pain in right foot: Secondary | ICD-10-CM | POA: Diagnosis not present

## 2018-06-09 DIAGNOSIS — M79671 Pain in right foot: Secondary | ICD-10-CM | POA: Diagnosis not present

## 2018-06-13 DIAGNOSIS — M79671 Pain in right foot: Secondary | ICD-10-CM | POA: Diagnosis not present

## 2018-06-19 DIAGNOSIS — M79671 Pain in right foot: Secondary | ICD-10-CM | POA: Diagnosis not present

## 2018-07-01 DIAGNOSIS — M79671 Pain in right foot: Secondary | ICD-10-CM | POA: Diagnosis not present

## 2018-07-03 DIAGNOSIS — M79671 Pain in right foot: Secondary | ICD-10-CM | POA: Diagnosis not present

## 2018-07-14 DIAGNOSIS — M722 Plantar fascial fibromatosis: Secondary | ICD-10-CM | POA: Diagnosis not present

## 2018-07-14 DIAGNOSIS — M79671 Pain in right foot: Secondary | ICD-10-CM | POA: Diagnosis not present

## 2018-07-22 IMAGING — MG 2D DIGITAL SCREENING BILATERAL MAMMOGRAM WITH CAD AND ADJUNCT TO
9 of 14 series · 9 of 30 positions shown · non-contrast
Comparison: Previous exam(s).

CLINICAL DATA: Screening.

EXAM:
2D DIGITAL SCREENING BILATERAL MAMMOGRAM WITH CAD AND ADJUNCT TOMO

[L MLO (1 of 2)]
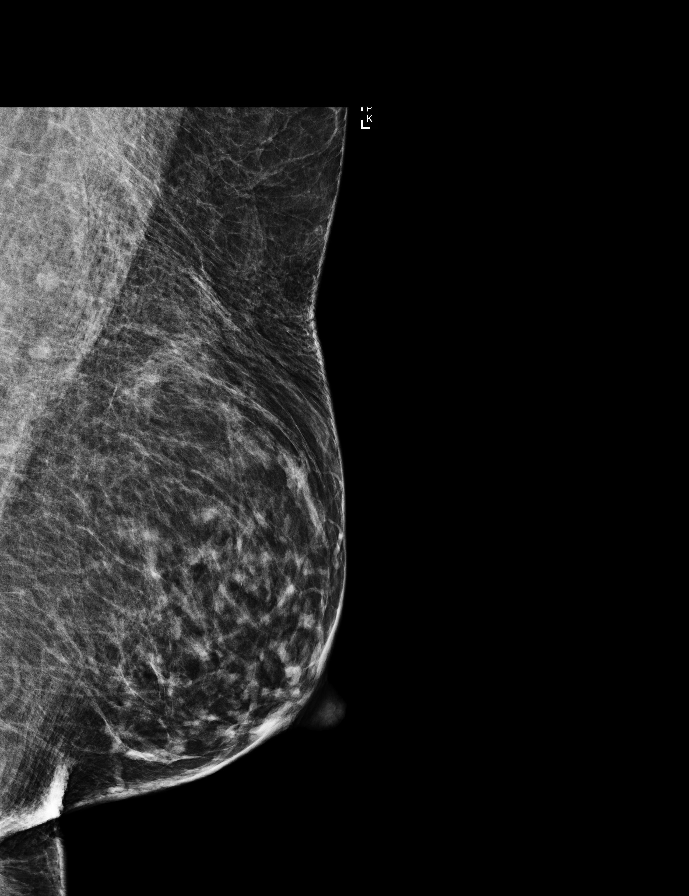

[R MLO (1 of 2)]
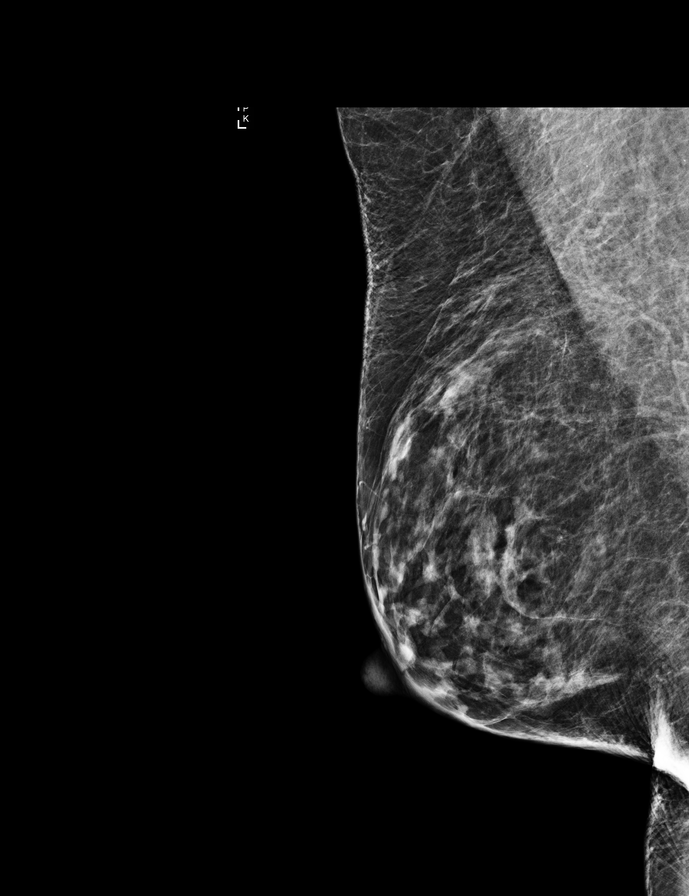

[R CC synth-2D]
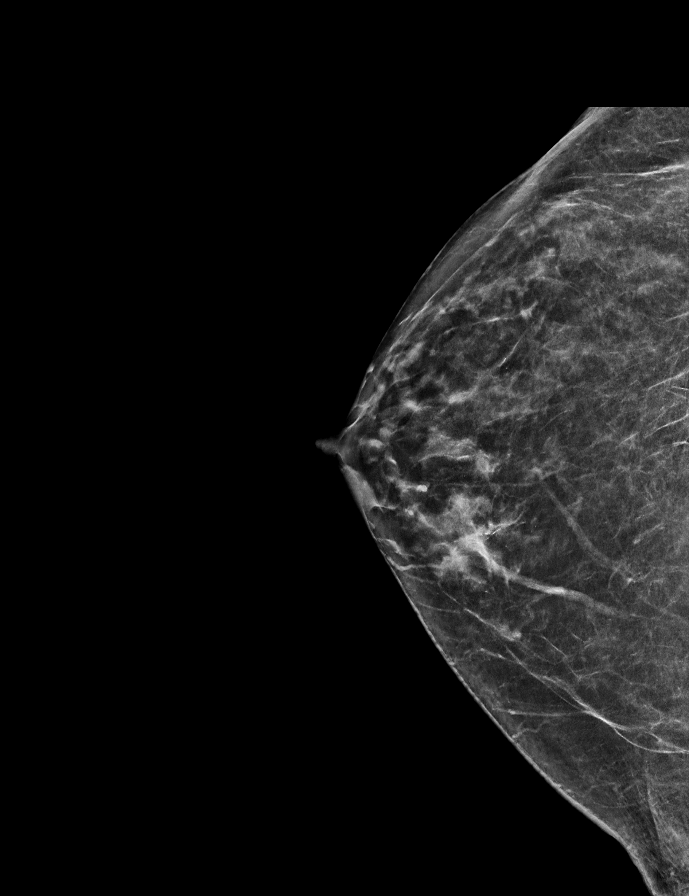

[L MLO (2 of 2)]
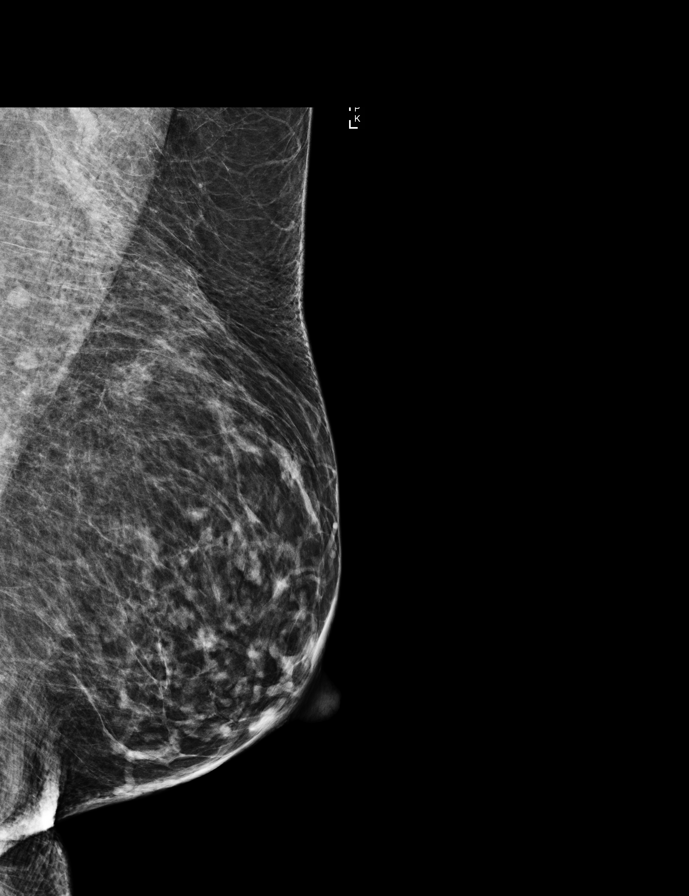

[R MLO (2 of 2)]
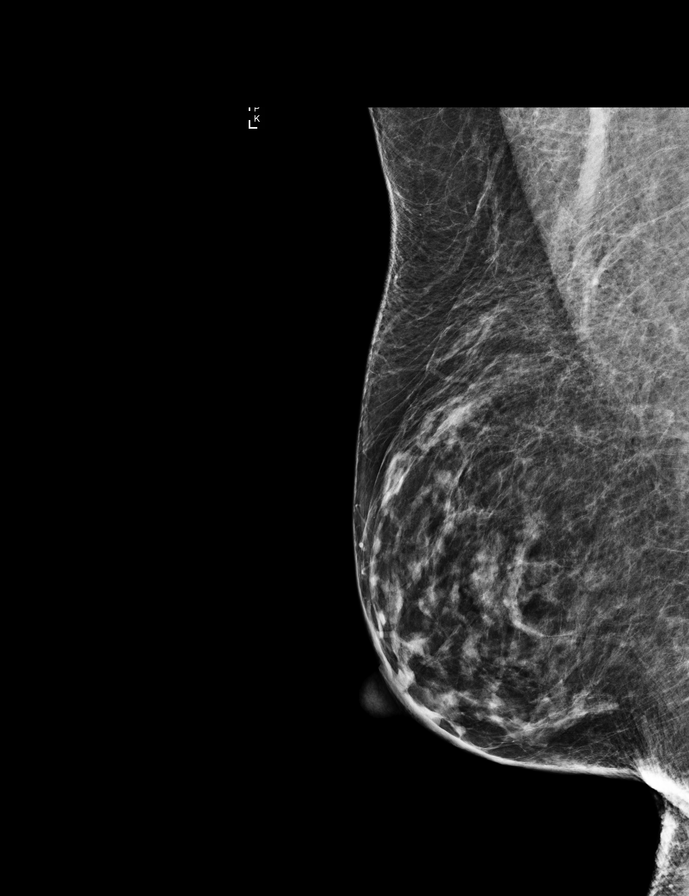

[L CC]
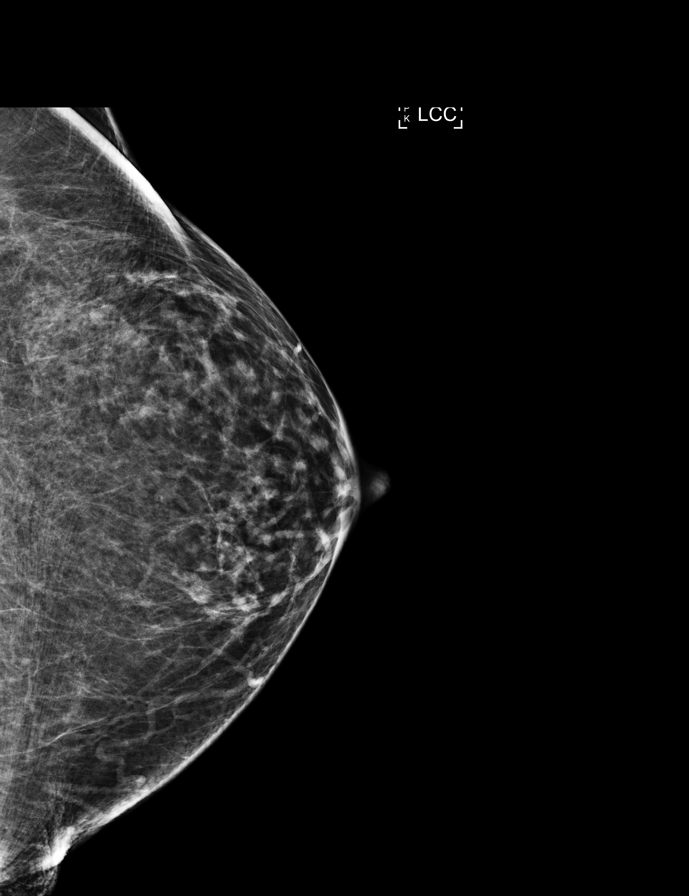

[R CC]
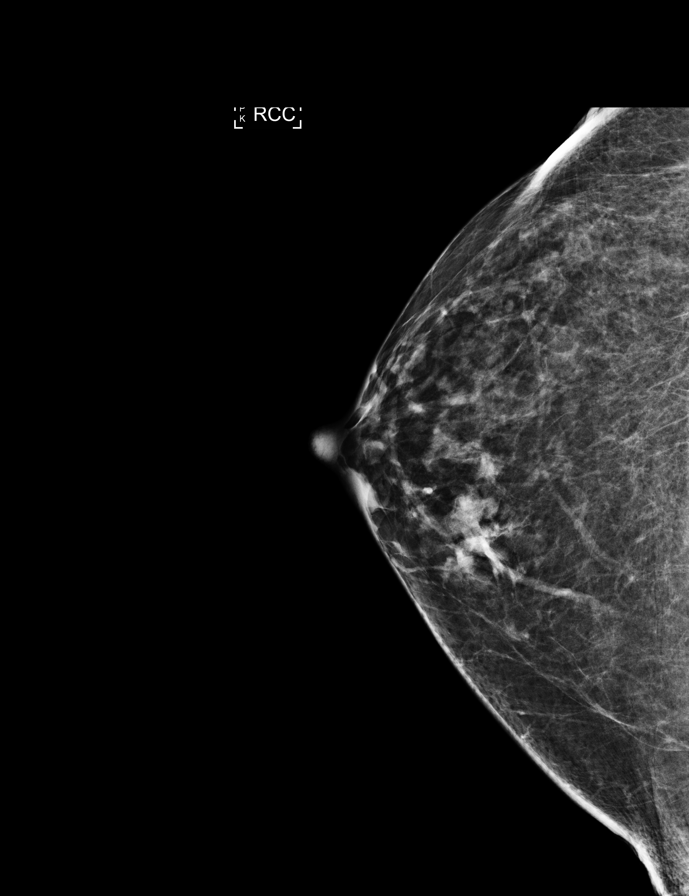

[L CC synth-2D]
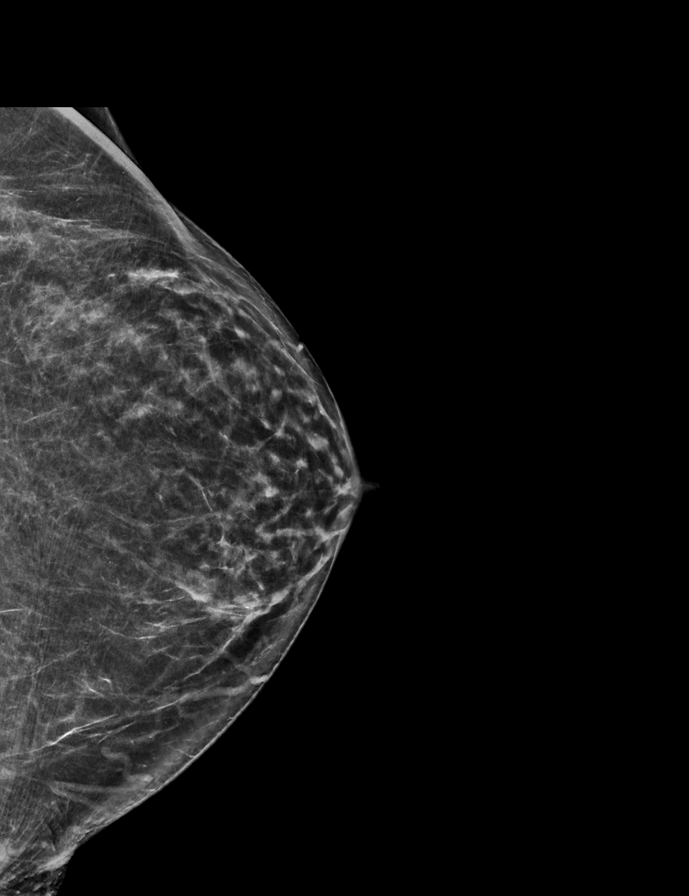

[R MLO synth-2D]
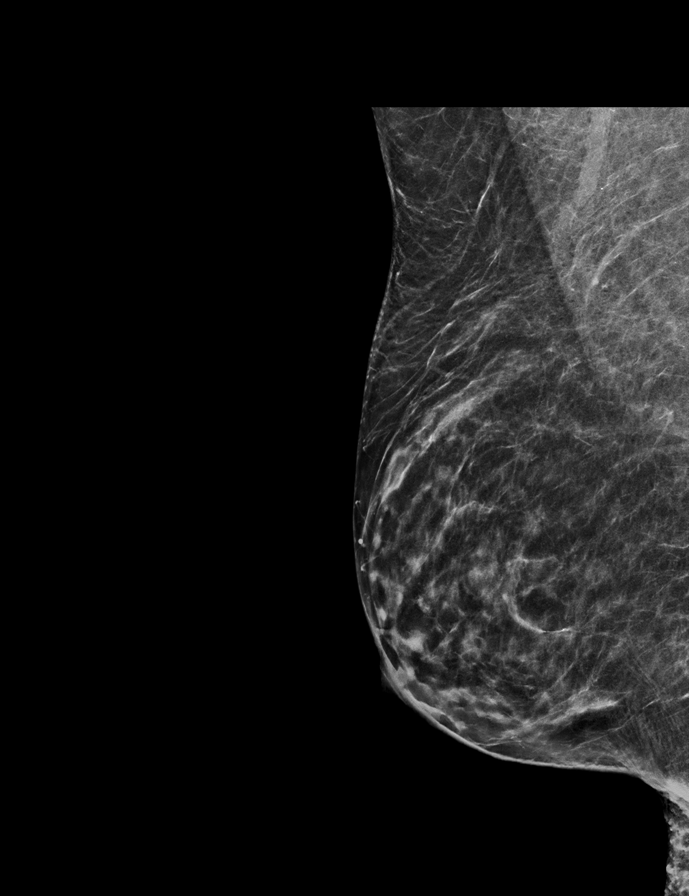

[9 of 30 positions shown; findings below may reference images not displayed]

ACR Breast Density Category b: There are scattered areas of
fibroglandular density.
FINDINGS: There are no findings suspicious for malignancy. Images were
processed with CAD.
IMPRESSION: No mammographic evidence of malignancy. A result letter of this
screening mammogram will be mailed directly to the patient.

RECOMMENDATION:
Screening mammogram in one year. (Code:97-6-RS4)

BI-RADS CATEGORY  1: Negative.

## 2018-07-23 DIAGNOSIS — R5383 Other fatigue: Secondary | ICD-10-CM | POA: Diagnosis not present

## 2018-07-23 DIAGNOSIS — E7849 Other hyperlipidemia: Secondary | ICD-10-CM | POA: Diagnosis not present

## 2018-07-23 DIAGNOSIS — E1165 Type 2 diabetes mellitus with hyperglycemia: Secondary | ICD-10-CM | POA: Diagnosis not present

## 2018-07-24 ENCOUNTER — Other Ambulatory Visit: Payer: Self-pay | Admitting: Obstetrics & Gynecology

## 2018-07-24 DIAGNOSIS — E119 Type 2 diabetes mellitus without complications: Secondary | ICD-10-CM | POA: Diagnosis not present

## 2018-07-24 NOTE — Telephone Encounter (Signed)
Medication refill request: estradiol 0.5tab Last AEX:  05/10/17 SM Next AEX: none Last MMG (if hormonal medication request): 03/21/18 BIRADS1:Neg  Refill authorized: 06/02/18 #30tabs/0R. Today #90/0R?

## 2018-07-25 ENCOUNTER — Ambulatory Visit: Payer: Medicare Other | Admitting: Obstetrics & Gynecology

## 2018-07-29 DIAGNOSIS — M25552 Pain in left hip: Secondary | ICD-10-CM | POA: Diagnosis not present

## 2018-07-29 DIAGNOSIS — M7062 Trochanteric bursitis, left hip: Secondary | ICD-10-CM | POA: Diagnosis not present

## 2018-07-30 DIAGNOSIS — E785 Hyperlipidemia, unspecified: Secondary | ICD-10-CM | POA: Diagnosis not present

## 2018-07-30 DIAGNOSIS — I10 Essential (primary) hypertension: Secondary | ICD-10-CM | POA: Diagnosis not present

## 2018-07-30 DIAGNOSIS — R5383 Other fatigue: Secondary | ICD-10-CM | POA: Diagnosis not present

## 2018-07-30 DIAGNOSIS — E559 Vitamin D deficiency, unspecified: Secondary | ICD-10-CM | POA: Diagnosis not present

## 2018-07-30 DIAGNOSIS — E1165 Type 2 diabetes mellitus with hyperglycemia: Secondary | ICD-10-CM | POA: Diagnosis not present

## 2018-08-06 ENCOUNTER — Other Ambulatory Visit: Payer: Self-pay | Admitting: Physician Assistant

## 2018-10-23 DIAGNOSIS — E1165 Type 2 diabetes mellitus with hyperglycemia: Secondary | ICD-10-CM | POA: Diagnosis not present

## 2018-10-23 DIAGNOSIS — M859 Disorder of bone density and structure, unspecified: Secondary | ICD-10-CM | POA: Diagnosis not present

## 2018-10-23 DIAGNOSIS — E7849 Other hyperlipidemia: Secondary | ICD-10-CM | POA: Diagnosis not present

## 2018-10-23 DIAGNOSIS — I10 Essential (primary) hypertension: Secondary | ICD-10-CM | POA: Diagnosis not present

## 2018-10-27 DIAGNOSIS — R82998 Other abnormal findings in urine: Secondary | ICD-10-CM | POA: Diagnosis not present

## 2018-10-27 DIAGNOSIS — I10 Essential (primary) hypertension: Secondary | ICD-10-CM | POA: Diagnosis not present

## 2018-10-29 DIAGNOSIS — R109 Unspecified abdominal pain: Secondary | ICD-10-CM | POA: Diagnosis not present

## 2018-10-29 DIAGNOSIS — E1169 Type 2 diabetes mellitus with other specified complication: Secondary | ICD-10-CM | POA: Diagnosis not present

## 2018-10-29 DIAGNOSIS — M8580 Other specified disorders of bone density and structure, unspecified site: Secondary | ICD-10-CM | POA: Diagnosis not present

## 2018-10-29 DIAGNOSIS — M1990 Unspecified osteoarthritis, unspecified site: Secondary | ICD-10-CM | POA: Diagnosis not present

## 2018-10-29 DIAGNOSIS — Z1339 Encounter for screening examination for other mental health and behavioral disorders: Secondary | ICD-10-CM | POA: Diagnosis not present

## 2018-10-29 DIAGNOSIS — K589 Irritable bowel syndrome without diarrhea: Secondary | ICD-10-CM | POA: Diagnosis not present

## 2018-10-29 DIAGNOSIS — E785 Hyperlipidemia, unspecified: Secondary | ICD-10-CM | POA: Diagnosis not present

## 2018-10-29 DIAGNOSIS — R600 Localized edema: Secondary | ICD-10-CM | POA: Diagnosis not present

## 2018-10-29 DIAGNOSIS — Z0000 Encounter for general adult medical examination without abnormal findings: Secondary | ICD-10-CM | POA: Diagnosis not present

## 2018-10-29 DIAGNOSIS — Z1331 Encounter for screening for depression: Secondary | ICD-10-CM | POA: Diagnosis not present

## 2018-10-29 DIAGNOSIS — Z20828 Contact with and (suspected) exposure to other viral communicable diseases: Secondary | ICD-10-CM | POA: Diagnosis not present

## 2018-10-29 DIAGNOSIS — I10 Essential (primary) hypertension: Secondary | ICD-10-CM | POA: Diagnosis not present

## 2018-10-29 DIAGNOSIS — G4700 Insomnia, unspecified: Secondary | ICD-10-CM | POA: Diagnosis not present

## 2018-10-29 DIAGNOSIS — E559 Vitamin D deficiency, unspecified: Secondary | ICD-10-CM | POA: Diagnosis not present

## 2018-10-30 DIAGNOSIS — E119 Type 2 diabetes mellitus without complications: Secondary | ICD-10-CM | POA: Diagnosis not present

## 2018-11-03 ENCOUNTER — Encounter: Payer: Self-pay | Admitting: Obstetrics & Gynecology

## 2018-11-03 ENCOUNTER — Ambulatory Visit (INDEPENDENT_AMBULATORY_CARE_PROVIDER_SITE_OTHER): Payer: Medicare Other | Admitting: Obstetrics & Gynecology

## 2018-11-03 ENCOUNTER — Other Ambulatory Visit: Payer: Self-pay

## 2018-11-03 VITALS — BP 118/70 | HR 76 | Temp 97.3°F | Ht 63.75 in | Wt 173.0 lb

## 2018-11-03 DIAGNOSIS — Z124 Encounter for screening for malignant neoplasm of cervix: Secondary | ICD-10-CM | POA: Diagnosis not present

## 2018-11-03 DIAGNOSIS — R059 Cough, unspecified: Secondary | ICD-10-CM

## 2018-11-03 DIAGNOSIS — Z01419 Encounter for gynecological examination (general) (routine) without abnormal findings: Secondary | ICD-10-CM | POA: Diagnosis not present

## 2018-11-03 DIAGNOSIS — R05 Cough: Secondary | ICD-10-CM

## 2018-11-03 MED ORDER — ESTRADIOL 0.5 MG PO TABS: ORAL_TABLET | ORAL | 3 refills | Status: DC

## 2018-11-03 NOTE — Progress Notes (Signed)
78 y.o. G40P2002 Married White or Caucasian female here for annual exam.  Husband is at [employer] and she hasn't seen him for five months.  Denies vaginal bleeding.    Continues to have low back pain.  Has been doing PT.  She did have an injection at [facility] that really helped.    Had telemedicine appt with endocrinology 10/30/2018.  HbA1C 6.6.     Is having some chronic dry cough.  PCP:  Dr. Burnett Corrente.  Last appt was in April.    Patient's last menstrual period was 04/02/1988.          Sexually active: No.  The current method of family planning is status post hysterectomy.    Exercising: Yes.     Smoker:  no  Health Maintenance: Pap:  01/26/16 neg   12/15/13 History of abnormal Pap:  no  MMG:  03/21/18 BIRADS1:neg Colonoscopy:  03/06/12 f/u 10 years BMD:   06/18/17 Normal  TDaP:  2013 Pneumonia vaccine(s):  done Shingrix:   Completed  Hep C testing: n/a Screening Labs: PCP   reports that she quit smoking about 57 years ago. She has never used smokeless tobacco. She reports that she does not drink alcohol or use drugs.  Past Medical History:  Diagnosis Date  . Anal fissure    as a child  . Asthma   . Diabetes mellitus    managed by diet and exercise  . Diverticulosis of colon (without mention of hemorrhage)   . Fatty liver   . Hyperlipidemia   . Irritable bowel syndrome   . Lymphocytic colitis    chronic  . Osteoarthritis   . Osteopenia 09/2007  . Other mucopurulent conjunctivitis   . Premature ventricular contractions   . Raynaud's syndrome   . Rosacea    opitcal  . Spinal stenosis     Past Surgical History:  Procedure Laterality Date  . CATARACT EXTRACTION Bilateral   . CHOLECYSTECTOMY    . dental implants    . DILATION AND CURETTAGE OF UTERUS  1990  . FOOT SURGERY    . HIP SURGERY Bilateral 1973  . Jaw Implants  2007  . KNEE ARTHROSCOPY Right   . lens implant    . ovarian wedge resection  1964  . REPLACEMENT TOTAL KNEE Bilateral 2005, 2007  . TOTAL  ABDOMINAL HYSTERECTOMY W/ BILATERAL SALPINGOOPHORECTOMY  1990   Excessive Bleeding   . TOTAL KNEE ARTHROPLASTY Left 08/2003  . TOTAL KNEE ARTHROPLASTY Right 08/2005    Current Outpatient Medications  Medication Sig Dispense Refill  . aspirin EC 81 MG tablet Take 1 tablet (81 mg total) by mouth daily.    . B Complex Vitamins (VITAMIN B COMPLEX PO) Take 1 tablet by mouth daily.     . capsicum (ZOSTRIX) 0.075 % topical cream Apply topically.    . chlorhexidine (PERIDEX) 0.12 % solution Use as directed 5 mLs in the mouth or throat as directed.     . Cholecalciferol (VITAMIN D-1000 MAX ST) 1000 units tablet Take 2,000 Units by mouth daily.     . diclofenac sodium (VOLTAREN) 1 % GEL diclofenac 1 % topical gel    . diphenoxylate-atropine (LOMOTIL) 2.5-0.025 MG per tablet TAKE ONE (1) TABLET FOUR TIMES EACH DAY AS NEEDED FOR DIARRHEA 30 tablet 0  . estradiol (ESTRACE) 0.5 MG tablet TAKE ONE-HALF TABLET DAILY 90 tablet 0  . famotidine (PEPCID) 20 MG tablet Take 20 mg by mouth 2 (two) times daily.    . fluocinonide cream (LIDEX)  0.05 % fluocinonide 0.05 % topical cream    . gabapentin (NEURONTIN) 100 MG capsule Take by mouth.    Marland Kitchen glucose blood ([facility]S BLOOD GLUCOSE TEST STRIPS) test strip Use as instructed to test 2 times daily    . JARDIANCE 10 MG TABS tablet Take 1 tablet by mouth daily.    . meclizine (ANTIVERT) 25 MG tablet meclizine 25 mg tablet    . metFORMIN (GLUCOPHAGE) 500 MG tablet metformin 500 mg tablet    . naproxen sodium (ALEVE) 220 MG tablet Take 220 mg by mouth as directed.    . potassium chloride (K-DUR) 10 MEQ tablet Take 2 tablets (20 mEq total) by mouth daily. 180 tablet 0  . Probiotic Product (PROBIOTIC DAILY PO) Take 1 tablet by mouth daily.    . rosuvastatin (CRESTOR) 5 MG tablet Take 5 mg by mouth daily.    . sitaGLIPtin (JANUVIA) 100 MG tablet Januvia 100 mg tablet    . spironolactone-hydrochlorothiazide (ALDACTAZIDE) 25-25 MG per tablet TAKE ONE TABLET EACH DAY 30 tablet 5    No current facility-administered medications for this visit.     Family History  Problem Relation Age of Onset  . Lung cancer Sister   . Osteoporosis Mother   . Heart disease Father   . Diabetes Paternal Uncle   . Ovarian cancer Maternal Grandmother   . Breast cancer Sister 22  . Colon cancer Neg Hx   . Stomach cancer Neg Hx     Review of Systems  Constitutional: Positive for fatigue.  Gastrointestinal: Positive for abdominal pain.  All other systems reviewed and are negative.   Exam:   BP 118/70   Pulse 76   Temp (!) 97.3 F (36.3 C) (Temporal)   Ht 5' 3.75" (1.619 m)   Wt 173 lb (78.5 kg)   LMP 04/02/1988   BMI 29.93 kg/m    Height: 5' 3.75" (161.9 cm)  Ht Readings from Last 3 Encounters:  11/03/18 5' 3.75" (1.619 m)  04/10/18 5' 5.5" (1.664 m)  12/20/17 5\' 5"  (1.651 m)    General appearance: alert, cooperative and appears stated age Head: Normocephalic, without obvious abnormality, atraumatic Neck: no adenopathy, supple, symmetrical, trachea midline and thyroid normal to inspection and palpation Lungs: clear to auscultation bilaterally Breasts: normal appearance, no masses or tenderness Heart: regular rate and rhythm Abdomen: soft, non-tender; bowel sounds normal; no masses,  no organomegaly Extremities: extremities normal, atraumatic, no cyanosis or edema Skin: Skin color, texture, turgor normal. No rashes or lesions Lymph nodes: Cervical, supraclavicular, and axillary nodes normal. No abnormal inguinal nodes palpated Neurologic: Grossly normal   Pelvic: External genitalia:  no lesions              Urethra:  normal appearing urethra with no masses, tenderness or lesions              Bartholins and Skenes: normal                 Vagina: normal appearing vagina with normal color and discharge, no lesions              Cervix: no lesions              Pap taken: No. Bimanual Exam:  Uterus:  normal size, contour, position, consistency, mobility, non-tender               Adnexa: normal adnexa and no mass, fullness, tenderness  Rectovaginal: Confirms               Anus:  normal sphincter tone, no lesions  Chaperone was present for exam.  A:  Well Woman with normal exam PMP, on HRT Family hx of ovarian cancer Mild osteopenia Low back pain Diabetes, type 2  P:   Mammogram guidelines reviewed  pap smear not indicated.  Last done 10/17. Lab work done with Dr. Jackelyn Poling RF for estradiol 0.5mg  1/2 tab.  She is going to start taking this every other day.  #45/4RF.  Clearly aware of risks and desires to continue.  Stroke and breast cancer discussed. Chest X ray, PA and lateral ordered Return annually or prn

## 2018-11-06 ENCOUNTER — Other Ambulatory Visit: Payer: Self-pay

## 2018-11-06 ENCOUNTER — Ambulatory Visit
Admission: RE | Admit: 2018-11-06 | Discharge: 2018-11-06 | Disposition: A | Discharge status: Home / Self Care | Payer: Medicare Other | Source: Ambulatory Visit | Attending: Obstetrics & Gynecology | Admitting: Obstetrics & Gynecology

## 2018-11-06 DIAGNOSIS — R059 Cough, unspecified: Secondary | ICD-10-CM

## 2018-11-06 DIAGNOSIS — R05 Cough: Secondary | ICD-10-CM | POA: Diagnosis not present

## 2018-11-11 ENCOUNTER — Ambulatory Visit: Payer: Medicare Other | Admitting: Obstetrics & Gynecology

## 2018-11-12 ENCOUNTER — Other Ambulatory Visit: Payer: Self-pay | Admitting: Physician Assistant

## 2018-11-17 ENCOUNTER — Encounter: Payer: Self-pay | Admitting: Cardiovascular Disease

## 2018-11-17 ENCOUNTER — Other Ambulatory Visit: Payer: Self-pay

## 2018-11-17 ENCOUNTER — Ambulatory Visit (INDEPENDENT_AMBULATORY_CARE_PROVIDER_SITE_OTHER): Payer: Medicare Other | Admitting: Cardiovascular Disease

## 2018-11-17 VITALS — BP 122/70 | HR 68 | Ht 63.75 in | Wt 175.0 lb

## 2018-11-17 DIAGNOSIS — I5189 Other ill-defined heart diseases: Secondary | ICD-10-CM | POA: Diagnosis not present

## 2018-11-17 DIAGNOSIS — I10 Essential (primary) hypertension: Secondary | ICD-10-CM

## 2018-11-17 NOTE — Patient Instructions (Signed)
Medication Instructions:  Your provider recommends that you continue on your current medications as directed. Please refer to the Current Medication list given to you today.    Labwork: None  Testing/Procedures: None  Follow-Up: Your provider wants you to follow-up in: 1 year with Dr. Cooper. You will receive a reminder letter in the mail two months in advance. If you don't receive a letter, please call our office to schedule the follow-up appointment.    

## 2018-11-17 NOTE — Progress Notes (Signed)
Cardiology Office Note:    Date:  11/17/2018   ID:  Brittney Fox, DOB 1940/08/07, MRN [id number]  PCP:  Velna Hatchet, MD  Cardiologist:  [name], MD  Electrophysiologist:  None   Referring MD: Velna Hatchet, MD   Chief Complaint  Patient presents with  . Shortness of Breath    History of Present Illness:    Brittney Fox is a 77 y.o. female with a hx of type 2 diabetes, venous insufficiency, and diastolic dysfunction, presents for follow-up of shortness of breath.  The patient is here alone today.  She is doing well from a cardiac perspective.  Shortness of breath has not been a problem of late.  No chest pain, chest pressure, orthopnea, or PND.  Leg swelling is unchanged.  She remains active, working as a Cabin crew.  States that she does a lot of walking.  Blood pressure has been well controlled.  She is had another oral hypoglycemic added for treatment of diabetes.  Past Medical History:  Diagnosis Date  . Anal fissure    as a child  . Asthma   . Diabetes mellitus    managed by diet and exercise  . Diverticulosis of colon (without mention of hemorrhage)   . Fatty liver   . Hyperlipidemia   . Irritable bowel syndrome   . Lymphocytic colitis    chronic  . Osteoarthritis   . Osteopenia 09/2007  . Other mucopurulent conjunctivitis   . Premature ventricular contractions   . Raynaud's syndrome   . Rosacea    opitcal  . Spinal stenosis     Past Surgical History:  Procedure Laterality Date  . CATARACT EXTRACTION Bilateral   . CHOLECYSTECTOMY    . dental implants    . DILATION AND CURETTAGE OF UTERUS  1990  . FOOT SURGERY    . HIP SURGERY Bilateral 1973  . Jaw Implants  2007  . KNEE ARTHROSCOPY Right   . lens implant    . ovarian wedge resection  1964  . REPLACEMENT TOTAL KNEE Bilateral 2005, 2007  . TOTAL ABDOMINAL HYSTERECTOMY W/ BILATERAL SALPINGOOPHORECTOMY  1990   Excessive Bleeding   . TOTAL KNEE ARTHROPLASTY Left 08/2003  . TOTAL  KNEE ARTHROPLASTY Right 08/2005    Current Medications: Current Meds  Medication Sig  . aspirin EC 81 MG tablet Take 1 tablet (81 mg total) by mouth daily.  . B Complex Vitamins (VITAMIN B COMPLEX PO) Take 1 tablet by mouth daily.   . capsicum (ZOSTRIX) 0.075 % topical cream Apply topically.  . chlorhexidine (PERIDEX) 0.12 % solution Use as directed 5 mLs in the mouth or throat as directed.   . Cholecalciferol (VITAMIN D-1000 MAX ST) 1000 units tablet Take 2,000 Units by mouth daily.   . diclofenac sodium (VOLTAREN) 1 % GEL diclofenac 1 % topical gel  . diphenoxylate-atropine (LOMOTIL) 2.5-0.025 MG per tablet TAKE ONE (1) TABLET FOUR TIMES EACH DAY AS NEEDED FOR DIARRHEA  . estradiol (ESTRACE) 0.5 MG tablet Take 1/2 tab every other day  . famotidine (PEPCID) 20 MG tablet Take 20 mg by mouth daily.   . fluocinonide cream (LIDEX) 0.05 % fluocinonide 0.05 % topical cream  . gabapentin (NEURONTIN) 100 MG capsule Take by mouth.  Marland Kitchen glucose blood ([facility] BLOOD GLUCOSE TEST STRIPS) test strip Use as instructed to test 2 times daily  . JARDIANCE 10 MG TABS tablet Take 1 tablet by mouth daily.  . meclizine (ANTIVERT) 25 MG tablet meclizine 25 mg tablet  .  naproxen sodium (ALEVE) 220 MG tablet Take 220 mg by mouth as directed.  . potassium chloride (K-DUR) 10 MEQ tablet TAKE TWO TABLETS EVERY DAY  . Probiotic Product (PROBIOTIC DAILY PO) Take 1 tablet by mouth daily.  . rosuvastatin (CRESTOR) 5 MG tablet Take 5 mg by mouth daily.  . sitaGLIPtin (JANUVIA) 100 MG tablet Januvia 100 mg tablet  . spironolactone-hydrochlorothiazide (ALDACTAZIDE) 25-25 MG per tablet TAKE ONE TABLET EACH DAY     Allergies:   Codeine phosphate and Morphine sulfate   Social History   Socioeconomic History  . Marital status: Married    Spouse name: Not on file  . Number of children: 2  . Years of education: Not on file  . Highest education level: Not on file  Occupational History  . Occupation: REAL ESTATE     Employer: [employer]  Social Needs  . Financial resource strain: Not on file  . Food insecurity    Worry: Not on file    Inability: Not on file  . Transportation needs    Medical: Not on file    Non-medical: Not on file  Tobacco Use  . Smoking status: Former Smoker    Quit date: 10/22/1961    Years since quitting: 61.1  . Smokeless tobacco: Never Used  Substance and Sexual Activity  . Alcohol use: No    Alcohol/week: 0.0 standard drinks  . Drug use: No  . Sexual activity: Not Currently    Partners: Male    Birth control/protection: Surgical    Comment: hysterectomy  Lifestyle  . Physical activity    Days per week: Not on file    Minutes per session: Not on file  . Stress: Not on file  Relationships  . Social Herbalist on phone: Not on file    Gets together: Not on file    Attends religious service: Not on file    Active member of club or organization: Not on file    Attends meetings of clubs or organizations: Not on file    Relationship status: Not on file  Other Topics Concern  . Not on file  Social History Narrative  . Not on file     Family History: The patient's family history includes Breast cancer (age of onset: 36) in her sister; Diabetes in her paternal uncle; Heart disease in her father; Lung cancer in her sister; Osteoporosis in her mother; Ovarian cancer in her maternal grandmother. There is no history of Colon cancer or Stomach cancer.  ROS:   Please see the history of present illness.    All other systems reviewed and are negative.  EKGs/Labs/Other Studies Reviewed:    The following studies were reviewed today: Echo 08/14/2016: Study Conclusions  - Left ventricle: The cavity size was normal. There was mild focal   basal hypertrophy of the septum. Systolic function was normal.   The estimated ejection fraction was in the range of 50% to 55%.   Wall motion was normal; there were no regional wall motion    abnormalities. Features are consistent with a pseudonormal left   ventricular filling pattern, with concomitant abnormal relaxation   and increased filling pressure (grade 2 diastolic dysfunction). - Aortic valve: There was trivial regurgitation. - Mitral valve: Calcified annulus. There was mild regurgitation. - Atrial septum: There was a possible patent foramen ovale. - Pulmonary arteries: Systolic pressure was mildly increased. PA   peak pressure: 34 mm Hg (S).  Impressions:  - Normal  LV systolic function; grade 2 diastolic dysfunction; trace   AI; mild MR; mild TR with mildly elevated pulmonary pressure.  EKG:  EKG is ordered today.  The ekg ordered today demonstrates normal sinus rhythm 68 bpm, left axis deviation, mildly prolonged QT with QTc 489 ms.  Recent Labs: No results found for requested labs within last 8760 hours.  Recent Lipid Panel    Component Value Date/Time   CHOL 144 09/13/2015 0734   TRIG 87 09/13/2015 0734   TRIG 88 02/06/2006 1028   HDL 59 09/13/2015 0734   CHOLHDL 2.4 09/13/2015 0734   VLDL 17 09/13/2015 0734   LDLCALC 68 09/13/2015 0734   LDLDIRECT 150.2 11/28/2010 1131    Physical Exam:    VS:  BP 122/70   Pulse 68   Ht 5' 3.75" (1.619 m)   Wt 175 lb (79.4 kg)   LMP 04/02/1988   SpO2 96%   BMI 30.27 kg/m     Wt Readings from Last 3 Encounters:  11/17/18 175 lb (79.4 kg)  11/03/18 173 lb (78.5 kg)  04/10/18 175 lb (79.4 kg)     GEN:  Well nourished, well developed in no acute distress HEENT: Normal NECK: No JVD; No carotid bruits LYMPHATICS: No lymphadenopathy CARDIAC: RRR, no murmurs, rubs, gallops RESPIRATORY:  Clear to auscultation without rales, wheezing or rhonchi  ABDOMEN: Soft, non-tender, non-distended MUSCULOSKELETAL:  1+ bilateral pretibial edema; No deformity  SKIN: Warm and dry NEUROLOGIC:  Alert and oriented x 3 PSYCHIATRIC:  Normal affect   ASSESSMENT:    1. Diastolic dysfunction   2. Essential hypertension     PLAN:    In order of problems listed above:  1. Overall appears stable.  Medications reviewed and will be continued without change. 2. Blood pressure is well controlled on Spironolactone/HCTZ.  Will continue.   Medication Adjustments/Labs and Tests Ordered: Current medicines are reviewed at length with the patient today.  Concerns regarding medicines are outlined above.  Orders Placed This Encounter  Procedures  . EKG 12-Lead   No orders of the defined types were placed in this encounter.   Patient Instructions  Medication Instructions:  Your provider recommends that you continue on your current medications as directed. Please refer to the Current Medication list given to you today.    Labwork: None  Testing/Procedures: None  Follow-Up: Your provider wants you to follow-up in: 1 year with Dr. Burt Knack. You will receive a reminder letter in the mail two months in advance. If you don't receive a letter, please call our office to schedule the follow-up appointment.      Signed, [name], MD  11/17/2018 1:02 PM    [facility]

## 2018-12-10 DIAGNOSIS — Z23 Encounter for immunization: Secondary | ICD-10-CM | POA: Diagnosis not present

## 2018-12-30 DIAGNOSIS — R109 Unspecified abdominal pain: Secondary | ICD-10-CM | POA: Diagnosis not present

## 2018-12-31 ENCOUNTER — Encounter

## 2019-02-12 ENCOUNTER — Other Ambulatory Visit: Payer: Self-pay | Admitting: Internal Medicine

## 2019-02-12 DIAGNOSIS — Z1231 Encounter for screening mammogram for malignant neoplasm of breast: Secondary | ICD-10-CM

## 2019-02-14 ENCOUNTER — Other Ambulatory Visit: Payer: Self-pay | Admitting: Physician Assistant

## 2019-02-23 DIAGNOSIS — R5383 Other fatigue: Secondary | ICD-10-CM | POA: Diagnosis not present

## 2019-02-23 DIAGNOSIS — E1169 Type 2 diabetes mellitus with other specified complication: Secondary | ICD-10-CM | POA: Diagnosis not present

## 2019-02-25 DIAGNOSIS — B379 Candidiasis, unspecified: Secondary | ICD-10-CM | POA: Diagnosis not present

## 2019-02-25 DIAGNOSIS — E1169 Type 2 diabetes mellitus with other specified complication: Secondary | ICD-10-CM | POA: Diagnosis not present

## 2019-02-25 DIAGNOSIS — R109 Unspecified abdominal pain: Secondary | ICD-10-CM | POA: Diagnosis not present

## 2019-02-25 DIAGNOSIS — G4700 Insomnia, unspecified: Secondary | ICD-10-CM | POA: Diagnosis not present

## 2019-03-17 ENCOUNTER — Other Ambulatory Visit: Payer: Self-pay | Admitting: Internal Medicine

## 2019-03-17 ENCOUNTER — Ambulatory Visit
Admission: RE | Admit: 2019-03-17 | Discharge: 2019-03-17 | Disposition: A | Discharge status: Home / Self Care | Payer: Medicare Other | Source: Ambulatory Visit | Attending: Internal Medicine | Admitting: Internal Medicine

## 2019-03-17 DIAGNOSIS — R109 Unspecified abdominal pain: Secondary | ICD-10-CM

## 2019-03-17 MED ORDER — IOPAMIDOL (ISOVUE-300) INJECTION 61%
100.0000 mL | Freq: Once | INTRAVENOUS | Status: AC | PRN
  Administered 2019-03-17: 100 mL via INTRAVENOUS

## 2019-03-23 ENCOUNTER — Other Ambulatory Visit: Payer: Self-pay

## 2019-03-23 ENCOUNTER — Telehealth: Payer: Self-pay | Admitting: Nurse Practitioner

## 2019-03-23 ENCOUNTER — Ambulatory Visit (HOSPITAL_COMMUNITY)
Admission: RE | Admit: 2019-03-23 | Discharge: 2019-03-23 | Disposition: A | Discharge status: Home / Self Care | Payer: Medicare Other | Source: Ambulatory Visit | Attending: Cardiology | Admitting: Cardiology

## 2019-03-23 ENCOUNTER — Telehealth: Payer: Self-pay | Admitting: Internal Medicine

## 2019-03-23 DIAGNOSIS — I872 Venous insufficiency (chronic) (peripheral): Secondary | ICD-10-CM

## 2019-03-23 DIAGNOSIS — R600 Localized edema: Secondary | ICD-10-CM

## 2019-03-23 NOTE — Telephone Encounter (Signed)
Hi Dr. Henrene Pastor, we have received a referral from pt's PCP for extensive sigmoid diverticulosis and acute diverticulitis. Pt used to see Dr. Olevia Perches and is requesting to continue her GI care with you. Is it ok to schedule pt with you? Thank you.

## 2019-03-23 NOTE — Telephone Encounter (Signed)
Yes, anytime. She's a dear friend. Thanks

## 2019-03-23 NOTE — Telephone Encounter (Signed)
Received call from Dr. Burt Knack to request order for venous ultrasound for patient who is having leg swelling. Test has been ordered and request for precert and scheduling sent to appropriate parties.

## 2019-03-23 NOTE — Telephone Encounter (Signed)
Unable to reach pt, voice mail full. Will try again tomorrow.

## 2019-03-24 NOTE — Telephone Encounter (Signed)
Consult sch on 12/23.

## 2019-03-25 ENCOUNTER — Encounter: Payer: Self-pay | Admitting: Internal Medicine

## 2019-03-25 ENCOUNTER — Ambulatory Visit (INDEPENDENT_AMBULATORY_CARE_PROVIDER_SITE_OTHER): Payer: Medicare Other | Admitting: Internal Medicine

## 2019-03-25 VITALS — BP 120/70 | HR 76 | Temp 98.4°F | Ht 63.75 in | Wt 163.0 lb

## 2019-03-25 DIAGNOSIS — R1032 Left lower quadrant pain: Secondary | ICD-10-CM | POA: Diagnosis not present

## 2019-03-25 DIAGNOSIS — R935 Abnormal findings on diagnostic imaging of other abdominal regions, including retroperitoneum: Secondary | ICD-10-CM

## 2019-03-25 DIAGNOSIS — K5732 Diverticulitis of large intestine without perforation or abscess without bleeding: Secondary | ICD-10-CM

## 2019-03-25 MED ORDER — DICYCLOMINE HCL 20 MG PO TABS: 20.0000 mg | ORAL_TABLET | Freq: Two times a day (BID) | ORAL | 2 refills | Status: DC

## 2019-03-25 MED ORDER — CIPROFLOXACIN HCL 500 MG PO TABS: 500.0000 mg | ORAL_TABLET | Freq: Two times a day (BID) | ORAL | 0 refills | Status: DC

## 2019-03-25 NOTE — Patient Instructions (Signed)
We have sent the following medications to your pharmacy for you to pick up at your convenience:  Cipro, Bentyl  Discontinue your Flagyl  Finish Cipro  Take 1-2 tablespoons of Citrucel or Metamucil daily  I will call you back to schedule your colonoscopy

## 2019-03-26 ENCOUNTER — Encounter: Payer: Self-pay | Admitting: Internal Medicine

## 2019-03-26 NOTE — Progress Notes (Signed)
HISTORY OF PRESENT ILLNESS:  Brittney Fox is a 78 y.o. female, and friend, who presents today regarding probable recurrent diverticulitis.  She has not been seen in this office for years.  Previous patient of Dr. Maurene Capes.  Now establishing with myself.  She reports to me 3 episodes of diverticulitis over the past 3 months.  CT scan of the abdomen and pelvis with contrast performed March 17, 2019 (reviewed) revealed acute sigmoid diverticulitis without evidence of abscess or free air.  Marked diverticulosis as well.  Periampullary duodenal diverticulum present.  She did undergo colonoscopy previously in 2002, 2006, and most recently 2013.  Found to have moderate sigmoid diverticulosis.  No other abnormalities.  At the time of her most recent diagnosis she was placed on antibiotics.  Ciprofloxacin and metronidazole.  Not tolerating metronidazole well.  Just about to finish a 10-day course.  She is having some problems with intermittent abdominal cramping and left-sided abdominal pain.  Questionable intolerance to amoxicillin (yeast infection and mouth ulcers).  She has had some weight loss recently though attributes this to altered diet.  Review of laboratories from her primary provider Dr. Ardeth Perfect February 25, 2019 shows unremarkable liver tests.  Hemoglobin A1c 6.5.  She is status post remote cholecystectomy and total abdominal hysterectomy  REVIEW OF SYSTEMS:  All non-GI ROS negative unless otherwise stated in the HPI except for sleeping problems  Past Medical History:  Diagnosis Date  . Anal fissure    as a child  . Asthma   . Diabetes mellitus    managed by diet and exercise  . Diverticulosis of colon (without mention of hemorrhage)   . Fatty liver   . Hyperlipidemia   . Irritable bowel syndrome   . Lymphocytic colitis    chronic  . Osteoarthritis   . Osteopenia 09/2007  . Other mucopurulent conjunctivitis   . Premature ventricular contractions   . Raynaud's syndrome   . Rosacea     opitcal  . Spinal stenosis     Past Surgical History:  Procedure Laterality Date  . CATARACT EXTRACTION Bilateral   . CHOLECYSTECTOMY    . dental implants    . DILATION AND CURETTAGE OF UTERUS  1990  . FOOT SURGERY    . HIP SURGERY Bilateral 1973  . Jaw Implants  2007  . KNEE ARTHROSCOPY Right   . lens implant    . ovarian wedge resection  1964  . REPLACEMENT TOTAL KNEE Bilateral 2005, 2007  . TOTAL ABDOMINAL HYSTERECTOMY W/ BILATERAL SALPINGOOPHORECTOMY  1990   Excessive Bleeding   . TOTAL KNEE ARTHROPLASTY Left 08/2003  . TOTAL KNEE ARTHROPLASTY Right 08/2005    Social History Brittney Fox  reports that she quit smoking about 57 years ago. She has never used smokeless tobacco. She reports that she does not drink alcohol or use drugs.  family history includes Breast cancer (age of onset: 92) in her sister; Diabetes in her paternal uncle; Heart disease in her father; Lung cancer in her sister; Osteoporosis in her mother; Ovarian cancer in her maternal grandmother.  Allergies  Allergen Reactions  . Amoxicillin Other (See Comments)    Ulcers and thrush  . Codeine Phosphate     REACTION: headache  . Morphine Sulfate     REACTION: tongue swelling       PHYSICAL EXAMINATION: Vital signs: BP 120/70   Pulse 76   Temp 98.4 F (36.9 C)   Ht 5' 3.75" (1.619 m)   Wt 163 lb (73.9 kg)  LMP 04/02/1988   BMI 28.20 kg/m   Constitutional: generally well-appearing, no acute distress Psychiatric: alert and oriented x3, cooperative Eyes: extraocular movements intact, anicteric, conjunctiva pink Mouth: oral pharynx moist, no lesions Neck: supple no lymphadenopathy Cardiovascular: heart regular rate and rhythm, no murmur Lungs: clear to auscultation bilaterally Abdomen: soft, nontender, nondistended, no obvious ascites, no peritoneal signs, normal bowel sounds, no organomegaly Rectal: Deferred to colonoscopy Extremities: no clubbing, cyanosis, or lower extremity edema  bilaterally Skin: no lesions on visible extremities Neuro: No focal deficits.  Cranial nerves intact  ASSESSMENT:  1.  Recurrent acute diverticulitis.  Has responded to recent antibiotic course.  Intolerant to metronidazole (nausea, metallic taste) 2.  Abnormal CT scan of the abdomen 3.  Prior colonoscopy 2013 4.  Intermittent abdominal cramping.  Question diverticular spasm 5.  Intolerance to amoxicillin.  Not clearly allergy.  Noted  PLAN:  1.  Stop metronidazole 2.  Complete ciprofloxacin course.  Ciprofloxacin refilled should the patient have similar problems suggesting diverticulitis and not have quick access to medical personnel.  She knows to contact this office should she have problems and decide to initiate therapy 3.  High-fiber diet or fiber supplementation (recommended Citrucel or Metamucil) 4.  Prescribe Bentyl 20 mg every 6 hours as needed for abdominal cramping.  Medication risks reviewed 5.  Schedule colonoscopy in about 6 weeks to rule out other entities masquerading as recurrent diverticulitis.The nature of the procedure, as well as the risks, benefits, and alternatives were carefully and thoroughly reviewed with the patient. Ample time for discussion and questions allowed. The patient understood, was satisfied, and agreed to proceed.

## 2019-04-09 ENCOUNTER — Other Ambulatory Visit: Payer: Self-pay

## 2019-04-09 ENCOUNTER — Ambulatory Visit
Admission: RE | Admit: 2019-04-09 | Discharge: 2019-04-09 | Disposition: A | Discharge status: Home / Self Care | Payer: Medicare Other | Source: Ambulatory Visit | Attending: Internal Medicine | Admitting: Internal Medicine

## 2019-04-09 DIAGNOSIS — Z1231 Encounter for screening mammogram for malignant neoplasm of breast: Secondary | ICD-10-CM

## 2019-04-16 DIAGNOSIS — Z23 Encounter for immunization: Secondary | ICD-10-CM | POA: Diagnosis not present

## 2019-04-16 DIAGNOSIS — E1140 Type 2 diabetes mellitus with diabetic neuropathy, unspecified: Secondary | ICD-10-CM | POA: Diagnosis not present

## 2019-04-16 DIAGNOSIS — E119 Type 2 diabetes mellitus without complications: Secondary | ICD-10-CM | POA: Diagnosis not present

## 2019-04-27 ENCOUNTER — Encounter: Payer: Self-pay | Admitting: Internal Medicine

## 2019-05-05 ENCOUNTER — Other Ambulatory Visit: Payer: Self-pay

## 2019-05-05 ENCOUNTER — Ambulatory Visit (AMBULATORY_SURGERY_CENTER): Payer: Self-pay | Admitting: *Deleted

## 2019-05-05 ENCOUNTER — Telehealth: Payer: Self-pay | Admitting: *Deleted

## 2019-05-05 VITALS — Temp 97.4°F | Ht 63.75 in | Wt 163.0 lb

## 2019-05-05 DIAGNOSIS — Z01818 Encounter for other preprocedural examination: Secondary | ICD-10-CM

## 2019-05-05 DIAGNOSIS — K5732 Diverticulitis of large intestine without perforation or abscess without bleeding: Secondary | ICD-10-CM

## 2019-05-05 DIAGNOSIS — K219 Gastro-esophageal reflux disease without esophagitis: Secondary | ICD-10-CM

## 2019-05-05 DIAGNOSIS — R935 Abnormal findings on diagnostic imaging of other abdominal regions, including retroperitoneum: Secondary | ICD-10-CM

## 2019-05-05 MED ORDER — NA SULFATE-K SULFATE-MG SULF 17.5-3.13-1.6 GM/177ML PO SOLN: ORAL | 0 refills | Status: DC

## 2019-05-05 NOTE — Telephone Encounter (Signed)
Dr. Henrene Pastor,  This pt is here for a PV and said she is having indigestion issues.  She is asking to have an EGD as well.  Is she ok to have both procedures at the same time?    Thanks, [employer]

## 2019-05-05 NOTE — Telephone Encounter (Signed)
Yes.  Thank you.

## 2019-05-05 NOTE — Progress Notes (Signed)
Pt is aware that care partner will wait in the car during procedure; if they feel like they will be too hot or cold to wait in the car; they may wait in the 4 th floor lobby. Patient is aware to bring only one care partner. We want them to wear a mask (we do not have any that we can provide them), practice social distancing, and we will check their temperatures when they get here.  I did remind the patient that their care partner needs to stay in the parking lot the entire time and have a cell phone available, we will call them when the pt is ready for discharge. Patient will wear mask into building.  Pt states she is "hard to get BP and oxygen level up after surgery."   See TE from 05-05-19- ok to add on EGD for GERD per Dr. Henrene Pastor  No egg or soy allergy  No home oxygen use   No medications for weight loss taken  emmi information given  covid test 05-14-19 at 1020 am

## 2019-05-05 NOTE — Telephone Encounter (Signed)
noted 

## 2019-05-12 DIAGNOSIS — Z23 Encounter for immunization: Secondary | ICD-10-CM | POA: Diagnosis not present

## 2019-05-14 ENCOUNTER — Ambulatory Visit (INDEPENDENT_AMBULATORY_CARE_PROVIDER_SITE_OTHER): Payer: Medicare Other

## 2019-05-14 ENCOUNTER — Other Ambulatory Visit: Payer: Self-pay | Admitting: Internal Medicine

## 2019-05-14 DIAGNOSIS — Z1159 Encounter for screening for other viral diseases: Secondary | ICD-10-CM

## 2019-05-19 ENCOUNTER — Other Ambulatory Visit: Payer: Self-pay

## 2019-05-19 ENCOUNTER — Encounter: Payer: Self-pay | Admitting: Internal Medicine

## 2019-05-19 ENCOUNTER — Ambulatory Visit (AMBULATORY_SURGERY_CENTER): Payer: Medicare Other | Admitting: Internal Medicine

## 2019-05-19 VITALS — BP 116/68 | HR 55 | Temp 96.2°F | Resp 16 | Ht 63.75 in | Wt 163.0 lb

## 2019-05-19 DIAGNOSIS — D120 Benign neoplasm of cecum: Secondary | ICD-10-CM | POA: Diagnosis not present

## 2019-05-19 DIAGNOSIS — R194 Change in bowel habit: Secondary | ICD-10-CM

## 2019-05-19 DIAGNOSIS — R935 Abnormal findings on diagnostic imaging of other abdominal regions, including retroperitoneum: Secondary | ICD-10-CM

## 2019-05-19 DIAGNOSIS — K449 Diaphragmatic hernia without obstruction or gangrene: Secondary | ICD-10-CM | POA: Diagnosis not present

## 2019-05-19 DIAGNOSIS — K2100 Gastro-esophageal reflux disease with esophagitis, without bleeding: Secondary | ICD-10-CM | POA: Diagnosis not present

## 2019-05-19 DIAGNOSIS — K5732 Diverticulitis of large intestine without perforation or abscess without bleeding: Secondary | ICD-10-CM | POA: Diagnosis not present

## 2019-05-19 DIAGNOSIS — K259 Gastric ulcer, unspecified as acute or chronic, without hemorrhage or perforation: Secondary | ICD-10-CM

## 2019-05-19 DIAGNOSIS — K219 Gastro-esophageal reflux disease without esophagitis: Secondary | ICD-10-CM | POA: Diagnosis not present

## 2019-05-19 DIAGNOSIS — K635 Polyp of colon: Secondary | ICD-10-CM

## 2019-05-19 DIAGNOSIS — K648 Other hemorrhoids: Secondary | ICD-10-CM | POA: Diagnosis not present

## 2019-05-19 MED ORDER — SODIUM CHLORIDE 0.9 % IV SOLN: 500.0000 mL | Freq: Once | INTRAVENOUS | Status: DC

## 2019-05-19 MED ORDER — OMEPRAZOLE 40 MG PO CPDR: 40.0000 mg | DELAYED_RELEASE_CAPSULE | Freq: Every day | ORAL | 11 refills | Status: DC

## 2019-05-19 NOTE — Op Note (Signed)
[facility] Patient Name: Brittney Fox Procedure Date: 05/19/2019 9:14 AM MRN: [id number] Endoscopist: Docia Chuck. Henrene Pastor , MD Age: 79 Referring MD:  Date of Birth: [birth year] Gender: Female Account #: [id number] Procedure:                Upper GI endoscopy with biopsies Indications:              Esophageal reflux symptoms that persist despite                            appropriate therapy Medicines:                Monitored Anesthesia Care Procedure:                Pre-Anesthesia Assessment:                           - Prior to the procedure, a History and Physical                            was performed, and patient medications and                            allergies were reviewed. The patient's tolerance of                            previous anesthesia was also reviewed. The risks                            and benefits of the procedure and the sedation                            options and risks were discussed with the patient.                            All questions were answered, and informed consent                            was obtained. Prior Anticoagulants: The patient has                            taken no previous anticoagulant or antiplatelet                            agents. ASA Grade Assessment: II - A patient with                            mild systemic disease. After reviewing the risks                            and benefits, the patient was deemed in                            satisfactory condition to undergo the procedure.  After obtaining informed consent, the endoscope was                            passed under direct vision. Throughout the                            procedure, the patient's blood pressure, pulse, and                            oxygen saturations were monitored continuously. The                            Endoscope was introduced through the mouth, and                            advanced to the second part  of duodenum. The upper                            GI endoscopy was accomplished without difficulty.                            The patient tolerated the procedure well. Scope In: Scope Out: Findings:                 The esophagus revealed mild distal esophagitis as                            manifested by edema at the mucosal Z-line. Also                            partial ring or stricture.                           The stomach revealed a sliding hernia. Also, many                            small superficial gastric ulcers and erosions were                            found in the gastric antrum. Biopsies were taken                            with a cold forceps for Helicobacter pylori testing                            using CLOtest. Stomach was otherwise normal                           The examined duodenum was normal.                           The cardia and gastric fundus were normal on  retroflexion, save hiatal hernia. Complications:            No immediate complications. Estimated Blood Loss:     Estimated blood loss: none. Impression:               1. Mild esophagitis and partial esophageal ring                           2. Multiple superficial antral ulcers and erosions.                            Status post CLO biopsy                           3. Otherwise unremarkable EGD. Recommendation:           1. Prescribe omeprazole 40 mg daily; #30; 11 refills                           2. Avoid unnecessary NSAIDs (medications like                            Advil, Aleve, ibuprofen). Tylenol is okay for pain                           3. Follow-up CLO biopsy                           4. Office follow-up appointment with Dr. Henrene Pastor in 4                            to 6 weeks. Docia Chuck. Henrene Pastor, MD 05/19/2019 9:49:49 AM This report has been signed electronically.

## 2019-05-19 NOTE — Progress Notes (Signed)
Pt tolerated well. VSS. Awake and to recovery. Oral bite block inserted and removed with ease. Atraumatic.

## 2019-05-19 NOTE — Progress Notes (Signed)
Temp-JB VS-Jauca  Pt's states no medical or surgical changes since previsit or office visit.

## 2019-05-19 NOTE — Patient Instructions (Signed)
Handouts on gastritis, polyps, hemorrhoids, and diverticulosis given to you today.  Await pathology results AVOID ASPIRIN, ASPIRIN CONTAINING PRODUCTS (BC OR GOODY POWDERS) OR NSAIDS (IBUPROFEN, ADVIL, ALEVE, AND MOTRIN); TYLENOL IS OK TO TAKE Start Omeprazole 40mg  once a day    YOU HAD AN ENDOSCOPIC PROCEDURE TODAY AT [facility]:   Refer to the procedure report that was given to you for any specific questions about what was found during the examination.  If the procedure report does not answer your questions, please call your gastroenterologist to clarify.  If you requested that your care partner not be given the details of your procedure findings, then the procedure report has been included in a sealed envelope for you to review at your convenience later.  YOU SHOULD EXPECT: Some feelings of bloating in the abdomen. Passage of more gas than usual.  Walking can help get rid of the air that was put into your GI tract during the procedure and reduce the bloating. If you had a lower endoscopy (such as a colonoscopy or flexible sigmoidoscopy) you may notice spotting of blood in your stool or on the toilet paper. If you underwent a bowel prep for your procedure, you may not have a normal bowel movement for a few days.  Please Note:  You might notice some irritation and congestion in your nose or some drainage.  This is from the oxygen used during your procedure.  There is no need for concern and it should clear up in a day or so.  SYMPTOMS TO REPORT IMMEDIATELY:   Following lower endoscopy (colonoscopy or flexible sigmoidoscopy):  Excessive amounts of blood in the stool  Significant tenderness or worsening of abdominal pains  Swelling of the abdomen that is new, acute  Fever of 100F or higher   Following upper endoscopy (EGD)  Vomiting of blood or coffee ground material  New chest pain or pain under the shoulder blades  Painful or persistently difficult swallowing  New  shortness of breath  Fever of 100F or higher  Black, tarry-looking stools  For urgent or emergent issues, a gastroenterologist can be reached at any hour by calling [phone].   DIET:  We do recommend a small meal at first, but then you may proceed to your regular diet.  Drink plenty of fluids but you should avoid alcoholic beverages for 24 hours.  ACTIVITY:  You should plan to take it easy for the rest of today and you should NOT DRIVE or use heavy machinery until tomorrow (because of the sedation medicines used during the test).    FOLLOW UP: Our staff will call the number listed on your records 48-72 hours following your procedure to check on you and address any questions or concerns that you may have regarding the information given to you following your procedure. If we do not reach you, we will leave a message.  We will attempt to reach you two times.  During this call, we will ask if you have developed any symptoms of COVID 19. If you develop any symptoms (ie: fever, flu-like symptoms, shortness of breath, cough etc.) before then, please call [phone].  If you test positive for Covid 19 in the 2 weeks post procedure, please call and report this information to [place].    If any biopsies were taken you will be contacted by phone or by letter within the next 1-3 weeks.  Please call us at [phone] if you have not heard about the biopsies in  3 weeks.    SIGNATURES/CONFIDENTIALITY: You and/or your care partner have signed paperwork which will be entered into your electronic medical record.  These signatures attest to the fact that that the information above on your After Visit Summary has been reviewed and is understood.  Full responsibility of the confidentiality of this discharge information lies with you and/or your care-partner.

## 2019-05-19 NOTE — Progress Notes (Signed)
Called to room to assist during endoscopic procedure.  Patient ID and intended procedure confirmed with present staff. Received instructions for my participation in the procedure from the performing physician.  

## 2019-05-19 NOTE — Op Note (Signed)
Brittney Fox Patient Name: Brittney Fox Procedure Date: 05/19/2019 9:14 AM MRN: [id number] Endoscopist: Docia Chuck. Henrene Pastor , MD Age: 79 Referring MD:  Date of Birth: [birth year] Gender: Female Account #: [id number] Procedure:                Colonoscopy with cold snare polypectomy x 1 Indications:              Abdominal pain in the left lower quadrant, Abnormal                            CT of the GI tract, Incidental change in bowel                            habits noted. Prior colonoscopy examinations 2002,                            2006, 2013 Medicines:                Monitored Anesthesia Care Procedure:                Pre-Anesthesia Assessment:                           - Prior to the procedure, a History and Physical                            was performed, and patient medications and                            allergies were reviewed. The patient's tolerance of                            previous anesthesia was also reviewed. The risks                            and benefits of the procedure and the sedation                            options and risks were discussed with the patient.                            All questions were answered, and informed consent                            was obtained. Prior Anticoagulants: The patient has                            taken no previous anticoagulant or antiplatelet                            agents. ASA Grade Assessment: II - A patient with                            mild systemic disease. After reviewing the risks  and benefits, the patient was deemed in                            satisfactory condition to undergo the procedure.                           After obtaining informed consent, the colonoscope                            was passed under direct vision. Throughout the                            procedure, the patient's blood pressure, pulse, and                            oxygen saturations  were monitored continuously. The                            Colonoscope was introduced through the anus and                            advanced to the the cecum, identified by                            appendiceal orifice and ileocecal valve. The                            ileocecal valve, appendiceal orifice, and rectum                            were photographed. The quality of the bowel                            preparation was excellent. The colonoscopy was                            performed without difficulty. The patient tolerated                            the procedure well. The bowel preparation used was                            SUPREP via split dose instruction. Scope In: 9:21:04 AM Scope Out: 9:33:55 AM Scope Withdrawal Time: 0 hours 9 minutes 9 seconds  Total Procedure Duration: 0 hours 12 minutes 51 seconds  Findings:                 A 4 mm polyp was found in the cecum. The polyp was                            sessile. The polyp was removed with a cold snare.                            Resection and retrieval were complete.  Multiple diverticula were found in the left colon.                           Internal hemorrhoids were found during retroflexion.                           The exam was otherwise without abnormality on                            direct and retroflexion views. Complications:            No immediate complications. Estimated blood loss:                            None. Estimated Blood Loss:     Estimated blood loss: none. Impression:               - One 4 mm polyp in the cecum, removed with a cold                            snare. Resected and retrieved.                           - Diverticulosis in the left colon.                           - Internal hemorrhoids.                           - The examination was otherwise normal on direct                            and retroflexion views. Recommendation:           - Repeat  colonoscopy is not recommended for                            surveillance.                           - Patient has a contact number available for                            emergencies. The signs and symptoms of potential                            delayed complications were discussed with the                            patient. Return to normal activities tomorrow.                            Written discharge instructions were provided to the                            patient.                           -  Resume previous diet.                           - Continue present medications.                           - Await pathology results. Docia Chuck. Henrene Pastor, MD 05/19/2019 9:43:54 AM This report has been signed electronically.

## 2019-05-20 LAB — HELICOBACTER PYLORI SCREEN-BIOPSY: UREASE: NEGATIVE

## 2019-05-22 ENCOUNTER — Telehealth: Payer: Self-pay | Admitting: *Deleted

## 2019-05-22 ENCOUNTER — Encounter: Payer: Self-pay | Admitting: Internal Medicine

## 2019-05-22 NOTE — Telephone Encounter (Signed)
  Follow up Call-  Call back number 05/19/2019  Post procedure Call Back phone  # [phone]  Permission to leave phone message Yes  Some recent data might be hidden     Patient questions:  Do you have a fever, pain , or abdominal swelling? No. Pain Score  0 *  Have you tolerated food without any problems? Yes.    Have you been able to return to your normal activities? Yes.    Do you have any questions about your discharge instructions: Diet   No. Medications  No. Follow up visit  No.  Do you have questions or concerns about your Care? No.  Actions: * If pain score is 4 or above: No action needed, pain <4.   1. Have you developed a fever since your procedure? no  2.   Have you had an respiratory symptoms (SOB or cough) since your procedure? no  3.   Have you tested positive for COVID 19 since your procedure no  4.   Have you had any family members/close contacts diagnosed with the COVID 19 since your procedure?  no   If yes to any of these questions please route to Joylene John, RN and Alphonsa Gin, Therapist, sports.

## 2019-06-11 DIAGNOSIS — H26491 Other secondary cataract, right eye: Secondary | ICD-10-CM | POA: Diagnosis not present

## 2019-06-11 DIAGNOSIS — Z961 Presence of intraocular lens: Secondary | ICD-10-CM | POA: Diagnosis not present

## 2019-06-11 DIAGNOSIS — E119 Type 2 diabetes mellitus without complications: Secondary | ICD-10-CM | POA: Diagnosis not present

## 2019-06-11 DIAGNOSIS — Z9841 Cataract extraction status, right eye: Secondary | ICD-10-CM | POA: Diagnosis not present

## 2019-06-11 DIAGNOSIS — Z9842 Cataract extraction status, left eye: Secondary | ICD-10-CM | POA: Diagnosis not present

## 2019-06-16 DIAGNOSIS — E1169 Type 2 diabetes mellitus with other specified complication: Secondary | ICD-10-CM | POA: Diagnosis not present

## 2019-06-16 DIAGNOSIS — G4700 Insomnia, unspecified: Secondary | ICD-10-CM | POA: Diagnosis not present

## 2019-06-16 DIAGNOSIS — I10 Essential (primary) hypertension: Secondary | ICD-10-CM | POA: Diagnosis not present

## 2019-06-16 DIAGNOSIS — R5383 Other fatigue: Secondary | ICD-10-CM | POA: Diagnosis not present

## 2019-06-16 DIAGNOSIS — K589 Irritable bowel syndrome without diarrhea: Secondary | ICD-10-CM | POA: Diagnosis not present

## 2019-06-16 DIAGNOSIS — Z79899 Other long term (current) drug therapy: Secondary | ICD-10-CM | POA: Diagnosis not present

## 2019-06-16 DIAGNOSIS — E559 Vitamin D deficiency, unspecified: Secondary | ICD-10-CM | POA: Diagnosis not present

## 2019-06-16 DIAGNOSIS — E7849 Other hyperlipidemia: Secondary | ICD-10-CM | POA: Diagnosis not present

## 2019-06-16 DIAGNOSIS — K5730 Diverticulosis of large intestine without perforation or abscess without bleeding: Secondary | ICD-10-CM | POA: Diagnosis not present

## 2019-07-01 ENCOUNTER — Ambulatory Visit: Payer: Medicare Other | Admitting: Internal Medicine

## 2019-07-20 ENCOUNTER — Telehealth: Payer: Self-pay

## 2019-07-20 NOTE — Telephone Encounter (Signed)
Patient is calling to let Dr. Sabra Heck know she is having trouble sleeping. Patient stated she was told to call if having this problem.

## 2019-07-20 NOTE — Telephone Encounter (Signed)
AEX 11/03/18, next AEX 01/15/20 MMG 04/09/19 Birads 1 Neg.  PMP on HRT Estrace 1/2 vag tab per AEX 11/2018. Diabetic, type 2 Had Covid vaccine series Feb 2021.   Spoke with pt. Pt reports having trouble sleeping and requesting something to help. Pt states this started over holidays in 2020 and after husband, Ed passed away in May 09, 2019 from [place] and not improving.   Pt states still working, exercising, has lost weight in recent past and had tried sleep aid med from PCP (cant remember name of med and not on file),  but didn't help.  Pt states wanting something that wont be addictive or give "hangover". Will discuss with Dr Sabra Heck for recommendations and return call to pt. Pt agreeable.   Pharmacy verified on file.   Routing to Dr Sabra Heck.

## 2019-07-21 MED ORDER — TRAZODONE HCL 50 MG PO TABS: 50.0000 mg | ORAL_TABLET | Freq: Every day | ORAL | 0 refills | Status: DC

## 2019-07-21 NOTE — Telephone Encounter (Signed)
Ok to try trazodone 50mg  qhs.  Using this for sleep is an off label use but this is one of the safest medications used for sleep.

## 2019-07-21 NOTE — Telephone Encounter (Signed)
Spoke with pt. Pt given recommendations per Dr Sabra Heck. Pt agreeable to try Trazodone Rx. Rx sent to pharmacy on file. # 30, 0RF. Pt to call in 3 weeks to give update per Dr Sabra Heck. Pt agreeable.   Routing to Dr Sabra Heck.  Encounter closed.

## 2019-07-23 ENCOUNTER — Ambulatory Visit (INDEPENDENT_AMBULATORY_CARE_PROVIDER_SITE_OTHER): Payer: Medicare Other | Admitting: Internal Medicine

## 2019-07-23 ENCOUNTER — Encounter: Payer: Self-pay | Admitting: Internal Medicine

## 2019-07-23 VITALS — BP 136/72 | HR 84 | Temp 97.4°F | Ht 65.0 in | Wt 162.0 lb

## 2019-07-23 DIAGNOSIS — R935 Abnormal findings on diagnostic imaging of other abdominal regions, including retroperitoneum: Secondary | ICD-10-CM

## 2019-07-23 DIAGNOSIS — K259 Gastric ulcer, unspecified as acute or chronic, without hemorrhage or perforation: Secondary | ICD-10-CM | POA: Diagnosis not present

## 2019-07-23 DIAGNOSIS — K5732 Diverticulitis of large intestine without perforation or abscess without bleeding: Secondary | ICD-10-CM | POA: Diagnosis not present

## 2019-07-23 DIAGNOSIS — K219 Gastro-esophageal reflux disease without esophagitis: Secondary | ICD-10-CM | POA: Diagnosis not present

## 2019-07-23 NOTE — Progress Notes (Signed)
HISTORY OF PRESENT ILLNESS:  Brittney Fox is a 79 y.o. female evaluated in the office March 25, 2019 regarding recurrent diverticulitis, abnormal CT scan, and intermittent abdominal cramping.  See that dictation.  She subsequently underwent colonoscopy and upper endoscopy (worsening reflux symptoms) on May 19, 2019.  Colonoscopy revealed a diminutive cecal polyp (SSP), left-sided diverticulosis, and internal hemorrhoids.  Upper endoscopy revealed reflux esophagitis and multiple superficial antral ulcers and erosions.  Testing for Helicobacter pylori was positive.  The patient had been using NSAIDs intermittently along with regular aspirin.  She was placed on omeprazole 40 mg daily and instructed to avoid unnecessary NSAIDs.  She presents today for follow-up.  She is pleased to tell me that her reflux symptoms and dyspeptic symptoms have resolved on PPI.  She is minimizing NSAIDs.  She does have occasional mild cramping type discomfort in the left lower quadrant, but nothing persistent.  No fevers.  Regular bowel habits.  No new complaints.  REVIEW OF SYSTEMS:  All non-GI ROS negative entirely  Past Medical History:  Diagnosis Date  . Anal fissure    as a child  . Blood transfusion without reported diagnosis   . Diabetes mellitus    managed by diet and exercise  . Diverticulosis of colon (without mention of hemorrhage)   . Fatty liver   . GERD (gastroesophageal reflux disease)   . Heart murmur   . Hyperlipidemia   . Irritable bowel syndrome   . Lymphocytic colitis    chronic  . Neuromuscular disorder (HCC)    neuropathy  . Osteoarthritis   . Osteopenia 09/2007  . Other mucopurulent conjunctivitis   . Premature ventricular contractions   . Raynaud's syndrome   . Rosacea    opitcal  . Spinal stenosis     Past Surgical History:  Procedure Laterality Date  . CATARACT EXTRACTION Bilateral   . CHOLECYSTECTOMY    . COLONOSCOPY    . dental implants    . DILATION AND  CURETTAGE OF UTERUS  1990  . FOOT SURGERY    . HIP SURGERY Bilateral 1973  . Jaw Implants  2007   2018.2019  . KNEE ARTHROSCOPY Right   . lens implant    . ovarian wedge resection  1964  . REPLACEMENT TOTAL KNEE Bilateral 2005, 2007  . TOTAL ABDOMINAL HYSTERECTOMY W/ BILATERAL SALPINGOOPHORECTOMY  1990   Excessive Bleeding   . TOTAL KNEE ARTHROPLASTY Left 08/2003  . TOTAL KNEE ARTHROPLASTY Right 08/2005    Social History Annalicia Witzke Fitzhenry  reports that she quit smoking about 57 years ago. She has never used smokeless tobacco. She reports that she does not drink alcohol or use drugs.  family history includes Breast cancer (age of onset: 47) in her sister; Diabetes in her paternal uncle; Heart disease in her father; Lung cancer in her sister; Osteoporosis in her mother; Ovarian cancer in her maternal grandmother.  Allergies  Allergen Reactions  . Amoxicillin Other (See Comments)    Ulcers and thrush  . Codeine Phosphate     REACTION: headache  . Morphine Sulfate     REACTION: tongue swelling       PHYSICAL EXAMINATION: Vital signs: BP 136/72   Pulse 84   Temp (!) 97.4 F (36.3 C)   Ht 5\' 5"  (1.651 m)   Wt 162 lb (73.5 kg)   LMP 04/02/1988   BMI 26.96 kg/m   Constitutional: generally well-appearing, no acute distress Psychiatric: alert and oriented x3, cooperative Eyes: extraocular movements intact, anicteric,  conjunctiva pink Mouth: oral pharynx moist, no lesions Neck: supple no lymphadenopathy Cardiovascular: heart regular rate and rhythm, no murmur Lungs: clear to auscultation bilaterally Abdomen: soft, nontender, nondistended, no obvious ascites, no peritoneal signs, normal bowel sounds, no organomegaly Rectal: Omitted Extremities: no clubbing, cyanosis, or lower extremity edema bilaterally Skin: no lesions on visible extremities Neuro: No focal deficits.  Cranial nerves intact  ASSESSMENT:  1.  Recurrent acute diverticulitis.  Resolved 2.  Recent  colonoscopy with diminutive polyp and diverticulosis without diverticulitis. 3.  GERD with esophagitis on endoscopy.  Improved on PPI 5.  NSAID induced gastric ulcer/erosions.  Now on PPI   PLAN:  1.  High-fiber diet for diverticular disease 2.  Reflux precautions 3.  Continue omeprazole 40 mg daily.  Medication risks reviewed 4.  Continue to avoid unnecessary NSAIDs 5.  Resume general medical care with PCP.  GI follow-up as needed

## 2019-07-23 NOTE — Patient Instructions (Signed)
Follow-up with Dr. Henrene Pastor as needed.

## 2019-08-04 DIAGNOSIS — E119 Type 2 diabetes mellitus without complications: Secondary | ICD-10-CM | POA: Diagnosis not present

## 2019-08-04 DIAGNOSIS — E1140 Type 2 diabetes mellitus with diabetic neuropathy, unspecified: Secondary | ICD-10-CM | POA: Diagnosis not present

## 2019-11-02 NOTE — Progress Notes (Deleted)
79 y.o. G2P2002 Married White or Caucasian female here for annual exam.    Patient's last menstrual period was 04/02/1988.          Sexually active: {yes no:314532}  The current method of family planning is status post hysterectomy.    Exercising: {yes no:314532}  {types:19826} Smoker:  no  Health Maintenance: Pap:  01/26/16 neg              12/15/13 History of abnormal Pap:  no MMG:  04/09/19 BIRADS 1 negative/density b Colonoscopy:  05/19/19 no f/u needed BMD:   06/18/17 Normal TDaP:  09/04/11 Pneumonia vaccine(s):  2009 Shingrix:   completed Hep C testing: *** Screening Labs: ***   reports that she quit smoking about 58 years ago. She has never used smokeless tobacco. She reports that she does not drink alcohol and does not use drugs.  Past Medical History:  Diagnosis Date   Anal fissure    as a child   Blood transfusion without reported diagnosis    Diabetes mellitus    managed by diet and exercise   Diverticulosis of colon (without mention of hemorrhage)    Fatty liver    GERD (gastroesophageal reflux disease)    Heart murmur    Hyperlipidemia    Irritable bowel syndrome    Lymphocytic colitis    chronic   Neuromuscular disorder ([place])    neuropathy   Osteoarthritis    Osteopenia 09/2007   Other mucopurulent conjunctivitis    Premature ventricular contractions    Raynaud's syndrome    Rosacea    opitcal   Spinal stenosis     Past Surgical History:  Procedure Laterality Date   CATARACT EXTRACTION Bilateral    CHOLECYSTECTOMY     COLONOSCOPY     dental implants     [place] Bilateral 1973   Jaw Implants  2007   2018.2019   KNEE ARTHROSCOPY Right    lens implant     ovarian wedge resection  1964   REPLACEMENT TOTAL KNEE Bilateral 2005, 2007   TOTAL ABDOMINAL HYSTERECTOMY W/ BILATERAL SALPINGOOPHORECTOMY  1990   Excessive Bleeding    TOTAL KNEE ARTHROPLASTY Left  08/2003   TOTAL KNEE ARTHROPLASTY Right 08/2005    Current Outpatient Medications  Medication Sig Dispense Refill   aspirin EC 81 MG tablet Take 1 tablet (81 mg total) by mouth daily.     B Complex Vitamins (VITAMIN B COMPLEX PO) Take 1 tablet by mouth daily.      chlorhexidine (PERIDEX) 0.12 % solution Use as directed 5 mLs in the mouth or throat as directed. PRN     Cholecalciferol (VITAMIN D-1000 MAX ST) 1000 units tablet Take 2,000 Units by mouth daily.      diclofenac sodium (VOLTAREN) 1 % GEL PRN     dicyclomine (BENTYL) 20 MG tablet Take 1 tablet (20 mg total) by mouth 2 (two) times daily. (Patient taking differently: Take 20 mg by mouth 2 (two) times daily. PRN) 20 tablet 2   diphenoxylate-atropine (LOMOTIL) 2.5-0.025 MG per tablet TAKE ONE (1) TABLET FOUR TIMES EACH DAY AS NEEDED FOR DIARRHEA 30 tablet 0   estradiol (ESTRACE) 0.5 MG tablet Take 1/2 tab every other day 45 tablet 3   fluocinonide cream (LIDEX) 0.05 % fluocinonide 0.05 % topical cream     gabapentin (NEURONTIN) 100 MG capsule Take by mouth.     glucose blood ([facility]  BLOOD GLUCOSE TEST STRIPS) test strip Use as instructed to test 2 times daily     JARDIANCE 10 MG TABS tablet Take 1 tablet by mouth daily.     meclizine (ANTIVERT) 25 MG tablet Take 25 mg by mouth as needed.      naproxen sodium (ALEVE) 220 MG tablet Take 220 mg by mouth as directed. PRN     omeprazole (PRILOSEC) 40 MG capsule Take 1 capsule (40 mg total) by mouth daily. 30 capsule 11   potassium chloride (KLOR-CON) 10 MEQ tablet TAKE TWO TABLETS EVERY DAY 180 tablet 2   Probiotic Product (PROBIOTIC DAILY PO) Take 1 tablet by mouth daily.     Psyllium (METAMUCIL PO) Take by mouth daily.     rosuvastatin (CRESTOR) 5 MG tablet Take 5 mg by mouth daily.     spironolactone-hydrochlorothiazide (ALDACTAZIDE) 25-25 MG per tablet TAKE ONE TABLET EACH DAY 30 tablet 5   traZODone (DESYREL) 50 MG tablet Take 1 tablet (50 mg total) by mouth at  bedtime. 30 tablet 0   No current facility-administered medications for this visit.    Family History  Problem Relation Age of Onset   Lung cancer Sister    Osteoporosis Mother    Heart disease Father    Diabetes Paternal Uncle    Ovarian cancer Maternal Grandmother    Breast cancer Sister 37   Colon cancer Neg Hx    Stomach cancer Neg Hx    Esophageal cancer Neg Hx    Rectal cancer Neg Hx     Review of Systems  Exam:   LMP 04/02/1988      General appearance: alert, cooperative and appears stated age Head: Normocephalic, without obvious abnormality, atraumatic Neck: no adenopathy, supple, symmetrical, trachea midline and thyroid {EXAM; THYROID:18604} Lungs: clear to auscultation bilaterally Breasts: {Exam; breast:13139::"normal appearance, no masses or tenderness"} Heart: regular rate and rhythm Abdomen: soft, non-tender; bowel sounds normal; no masses,  no organomegaly Extremities: extremities normal, atraumatic, no cyanosis or edema Skin: Skin color, texture, turgor normal. No rashes or lesions Lymph nodes: Cervical, supraclavicular, and axillary nodes normal. No abnormal inguinal nodes palpated Neurologic: Grossly normal   Pelvic: External genitalia:  no lesions              Urethra:  normal appearing urethra with no masses, tenderness or lesions              Bartholins and Skenes: normal                 Vagina: normal appearing vagina with normal color and discharge, no lesions              Cervix: {exam; cervix:14595}              Pap taken: {yes no:314532} Bimanual Exam:  Uterus:  {exam; uterus:12215}              Adnexa: {exam; adnexa:12223}               Rectovaginal: Confirms               Anus:  normal sphincter tone, no lesions  Chaperone, ***Terence Lux, CMA, was present for exam.  A:  Well Woman with normal exam  P:   {plan; gyn:5269::"mammogram","pap smear","return annually or prn"}

## 2019-11-04 ENCOUNTER — Ambulatory Visit: Payer: Medicare Other | Admitting: Obstetrics & Gynecology

## 2019-11-09 NOTE — Progress Notes (Deleted)
79 y.o. G2P2002 Married White or Caucasian female here for annual exam.    Patient's last menstrual period was 04/02/1988.          Sexually active: {yes no:314532}  The current method of family planning is status post hysterectomy.    Exercising: {yes no:314532}  {types:19826} Smoker:  no  Health Maintenance: Pap:  01/26/16 neg              12/15/13 History of abnormal Pap:  no MMG:  04/09/19  Density Category B, BIRADS Category 1: Negative Colonoscopy:  05/19/19  Multiple Diverticula, otherwise normal  BMD:   06/18/17  Normal TDaP:  2013 Pneumonia vaccine(s):  Completed  Shingrix:   Completed Hep C testing: NA Screening Labs: PCP    reports that she quit smoking about 58 years ago. She has never used smokeless tobacco. She reports that she does not drink alcohol and does not use drugs.  Past Medical History:  Diagnosis Date  . Anal fissure    as a child  . Blood transfusion without reported diagnosis   . Diabetes mellitus    managed by diet and exercise  . Diverticulosis of colon (without mention of hemorrhage)   . Fatty liver   . GERD (gastroesophageal reflux disease)   . Heart murmur   . Hyperlipidemia   . Irritable bowel syndrome   . Lymphocytic colitis    chronic  . Neuromuscular disorder (HCC)    neuropathy  . Osteoarthritis   . Osteopenia 09/2007  . Other mucopurulent conjunctivitis   . Premature ventricular contractions   . Raynaud's syndrome   . Rosacea    opitcal  . Spinal stenosis     Past Surgical History:  Procedure Laterality Date  . CATARACT EXTRACTION Bilateral   . CHOLECYSTECTOMY    . COLONOSCOPY    . dental implants    . DILATION AND CURETTAGE OF UTERUS  1990  . FOOT SURGERY    . HIP SURGERY Bilateral 1973  . Jaw Implants  2007   2018.2019  . KNEE ARTHROSCOPY Right   . lens implant    . ovarian wedge resection  1964  . REPLACEMENT TOTAL KNEE Bilateral 2005, 2007  . TOTAL ABDOMINAL HYSTERECTOMY W/ BILATERAL SALPINGOOPHORECTOMY  1990    Excessive Bleeding   . TOTAL KNEE ARTHROPLASTY Left 08/2003  . TOTAL KNEE ARTHROPLASTY Right 08/2005    Current Outpatient Medications  Medication Sig Dispense Refill  . aspirin EC 81 MG tablet Take 1 tablet (81 mg total) by mouth daily.    . B Complex Vitamins (VITAMIN B COMPLEX PO) Take 1 tablet by mouth daily.     . chlorhexidine (PERIDEX) 0.12 % solution Use as directed 5 mLs in the mouth or throat as directed. PRN    . Cholecalciferol (VITAMIN D-1000 MAX ST) 1000 units tablet Take 2,000 Units by mouth daily.     . diclofenac sodium (VOLTAREN) 1 % GEL PRN    . dicyclomine (BENTYL) 20 MG tablet Take 1 tablet (20 mg total) by mouth 2 (two) times daily. (Patient taking differently: Take 20 mg by mouth 2 (two) times daily. PRN) 20 tablet 2  . diphenoxylate-atropine (LOMOTIL) 2.5-0.025 MG per tablet TAKE ONE (1) TABLET FOUR TIMES EACH DAY AS NEEDED FOR DIARRHEA 30 tablet 0  . estradiol (ESTRACE) 0.5 MG tablet Take 1/2 tab every other day 45 tablet 3  . fluocinonide cream (LIDEX) 0.05 % fluocinonide 0.05 % topical cream    . gabapentin (NEURONTIN) 100 MG capsule  Take by mouth.    Marland Kitchen glucose blood ([facility] BLOOD GLUCOSE TEST STRIPS) test strip Use as instructed to test 2 times daily    . JARDIANCE 10 MG TABS tablet Take 1 tablet by mouth daily.    . meclizine (ANTIVERT) 25 MG tablet Take 25 mg by mouth as needed.     . naproxen sodium (ALEVE) 220 MG tablet Take 220 mg by mouth as directed. PRN    . omeprazole (PRILOSEC) 40 MG capsule Take 1 capsule (40 mg total) by mouth daily. 30 capsule 11  . potassium chloride (KLOR-CON) 10 MEQ tablet TAKE TWO TABLETS EVERY DAY 180 tablet 2  . Probiotic Product (PROBIOTIC DAILY PO) Take 1 tablet by mouth daily.    . Psyllium (METAMUCIL PO) Take by mouth daily.    . rosuvastatin (CRESTOR) 5 MG tablet Take 5 mg by mouth daily.    Marland Kitchen spironolactone-hydrochlorothiazide (ALDACTAZIDE) 25-25 MG per tablet TAKE ONE TABLET EACH DAY 30 tablet 5  . traZODone (DESYREL) 50 MG  tablet Take 1 tablet (50 mg total) by mouth at bedtime. 30 tablet 0   No current facility-administered medications for this visit.    Family History  Problem Relation Age of Onset  . Lung cancer Sister   . Osteoporosis Mother   . Heart disease Father   . Diabetes Paternal Uncle   . Ovarian cancer Maternal Grandmother   . Breast cancer Sister 45  . Colon cancer Neg Hx   . Stomach cancer Neg Hx   . Esophageal cancer Neg Hx   . Rectal cancer Neg Hx     Review of Systems  Exam:   LMP 04/02/1988      General appearance: alert, cooperative and appears stated age Head: Normocephalic, without obvious abnormality, atraumatic Neck: no adenopathy, supple, symmetrical, trachea midline and thyroid {EXAM; THYROID:18604} Lungs: clear to auscultation bilaterally Breasts: {Exam; breast:13139::"normal appearance, no masses or tenderness"} Heart: regular rate and rhythm Abdomen: soft, non-tender; bowel sounds normal; no masses,  no organomegaly Extremities: extremities normal, atraumatic, no cyanosis or edema Skin: Skin color, texture, turgor normal. No rashes or lesions Lymph nodes: Cervical, supraclavicular, and axillary nodes normal. No abnormal inguinal nodes palpated Neurologic: Grossly normal   Pelvic: External genitalia:  no lesions              Urethra:  normal appearing urethra with no masses, tenderness or lesions              Bartholins and Skenes: normal                 Vagina: normal appearing vagina with normal color and discharge, no lesions              Cervix: {exam; cervix:14595}              Pap taken: {yes no:314532} Bimanual Exam:  Uterus:  {exam; uterus:12215}              Adnexa: {exam; adnexa:12223}               Rectovaginal: Confirms               Anus:  normal sphincter tone, no lesions  Chaperone, ***Terence Lux, CMA, was present for exam.  A:  Well Woman with normal exam  P:   {plan; gyn:5269::"mammogram","pap smear","return annually or prn"}

## 2019-11-12 ENCOUNTER — Ambulatory Visit: Payer: Medicare Other | Admitting: Obstetrics & Gynecology

## 2019-11-12 DIAGNOSIS — E7849 Other hyperlipidemia: Secondary | ICD-10-CM | POA: Diagnosis not present

## 2019-11-12 DIAGNOSIS — M859 Disorder of bone density and structure, unspecified: Secondary | ICD-10-CM | POA: Diagnosis not present

## 2019-11-12 DIAGNOSIS — E1169 Type 2 diabetes mellitus with other specified complication: Secondary | ICD-10-CM | POA: Diagnosis not present

## 2019-11-13 ENCOUNTER — Other Ambulatory Visit: Payer: Self-pay | Admitting: Physician Assistant

## 2019-11-16 ENCOUNTER — Ambulatory Visit: Payer: Medicare Other | Admitting: Obstetrics & Gynecology

## 2019-11-16 DIAGNOSIS — Z0000 Encounter for general adult medical examination without abnormal findings: Secondary | ICD-10-CM | POA: Diagnosis not present

## 2019-11-16 DIAGNOSIS — I10 Essential (primary) hypertension: Secondary | ICD-10-CM | POA: Diagnosis not present

## 2019-11-16 DIAGNOSIS — E1169 Type 2 diabetes mellitus with other specified complication: Secondary | ICD-10-CM | POA: Diagnosis not present

## 2019-11-16 DIAGNOSIS — K589 Irritable bowel syndrome without diarrhea: Secondary | ICD-10-CM | POA: Diagnosis not present

## 2019-11-16 DIAGNOSIS — M8580 Other specified disorders of bone density and structure, unspecified site: Secondary | ICD-10-CM | POA: Diagnosis not present

## 2019-11-16 DIAGNOSIS — G629 Polyneuropathy, unspecified: Secondary | ICD-10-CM | POA: Diagnosis not present

## 2019-11-16 DIAGNOSIS — M48061 Spinal stenosis, lumbar region without neurogenic claudication: Secondary | ICD-10-CM | POA: Diagnosis not present

## 2019-11-16 DIAGNOSIS — K253 Acute gastric ulcer without hemorrhage or perforation: Secondary | ICD-10-CM | POA: Diagnosis not present

## 2019-11-16 DIAGNOSIS — K2100 Gastro-esophageal reflux disease with esophagitis, without bleeding: Secondary | ICD-10-CM | POA: Diagnosis not present

## 2019-11-16 DIAGNOSIS — E785 Hyperlipidemia, unspecified: Secondary | ICD-10-CM | POA: Diagnosis not present

## 2019-11-16 DIAGNOSIS — R252 Cramp and spasm: Secondary | ICD-10-CM | POA: Diagnosis not present

## 2019-11-16 DIAGNOSIS — G4700 Insomnia, unspecified: Secondary | ICD-10-CM | POA: Diagnosis not present

## 2019-11-20 DIAGNOSIS — Z23 Encounter for immunization: Secondary | ICD-10-CM | POA: Diagnosis not present

## 2019-12-14 DIAGNOSIS — Z23 Encounter for immunization: Secondary | ICD-10-CM | POA: Diagnosis not present

## 2019-12-15 NOTE — Progress Notes (Signed)
79 y.o. G55P2002 Married White or Caucasian female here for breast and pelvic exam.  I am also following her for HRT use.  She does not want to stop this.  Feels really good with it.  Clearly aware of risks of breast cancer, stroke, MI.    Husband died of Covid.  She's, of course, grieving.  She went for several months with not being able to see him due to Covid.  He was diagnosed with Covid in 05-03-2022.  He died in May 03, 2022.    Reports she drove back from [place] on Sunday but had a little stool leakage and she stopped at a bathroom and cleaned up but had some stool in her underwear.  Gradually this week, her low back has been bothering her as well as having vulvar irritation/itching.  Denies vaginal bleeding.    She's going to [employer] to see her son.  Hasn't seen him in several months.    Denies vaginal bleeding.  Patient's last menstrual period was 04/02/1988.    hysterectomy      Sexually active: No.  H/O STD:  no  Health Maintenance: PCP:  Velna Hatchet  Last wellness appt was last month.  Did blood work at that appt:  yes Vaccines are up to date:  no Colonoscopy:  05-19-2019 polyp MMG:  04-09-2019 category b density birads 1:neg BMD:  06-18-17 normal Last pap smear:  01-26-16 neg H/o abnormal pap smear:  No poct urine-neg    reports that she quit smoking about 58 years ago. She has never used smokeless tobacco. She reports that she does not drink alcohol and does not use drugs.  Past Medical History:  Diagnosis Date  . Anal fissure    as a child  . Blood transfusion without reported diagnosis   . Diabetes mellitus    managed by diet and exercise  . Diverticulosis of colon (without mention of hemorrhage)   . Fatty liver   . GERD (gastroesophageal reflux disease)   . Heart murmur   . Hyperlipidemia   . Irritable bowel syndrome   . Lymphocytic colitis    chronic  . Neuromuscular disorder (HCC)    neuropathy  . Osteoarthritis   . Osteopenia 09/2007  . Other  mucopurulent conjunctivitis   . Premature ventricular contractions   . Raynaud's syndrome   . Rosacea    opitcal  . Spinal stenosis     Past Surgical History:  Procedure Laterality Date  . CATARACT EXTRACTION Bilateral   . CHOLECYSTECTOMY    . COLONOSCOPY    . dental implants    . DILATION AND CURETTAGE OF UTERUS  1990  . FOOT SURGERY    . HIP SURGERY Bilateral 1973  . Jaw Implants  2007   2018.2019  . KNEE ARTHROSCOPY Right   . lens implant    . ovarian wedge resection  1964  . REPLACEMENT TOTAL KNEE Bilateral 2005, 2007  . TOTAL ABDOMINAL HYSTERECTOMY W/ BILATERAL SALPINGOOPHORECTOMY  1990   Excessive Bleeding   . TOTAL KNEE ARTHROPLASTY Left 08/2003  . TOTAL KNEE ARTHROPLASTY Right 08/2005    Current Outpatient Medications  Medication Sig Dispense Refill  . aspirin EC 81 MG tablet Take 1 tablet (81 mg total) by mouth daily.    . chlorhexidine (PERIDEX) 0.12 % solution Use as directed 5 mLs in the mouth or throat as directed. PRN    . dicyclomine (BENTYL) 20 MG tablet Take 1 tablet (20 mg total) by mouth 2 (two) times daily. (  Patient taking differently: Take 20 mg by mouth 2 (two) times daily. PRN) 20 tablet 2  . diphenoxylate-atropine (LOMOTIL) 2.5-0.025 MG per tablet TAKE ONE (1) TABLET FOUR TIMES EACH DAY AS NEEDED FOR DIARRHEA 30 tablet 0  . estradiol (ESTRACE) 0.5 MG tablet Take 1/2 tab every other day 45 tablet 3  . FIBER PO Take by mouth.    . gabapentin (NEURONTIN) 100 MG capsule Take by mouth.    Marland Kitchen JARDIANCE 10 MG TABS tablet Take 1 tablet by mouth daily.    . naproxen sodium (ALEVE) 220 MG tablet Take 220 mg by mouth as directed. PRN    . NON FORMULARY Gluco Shield Pro    . omeprazole (PRILOSEC) 40 MG capsule Take 1 capsule (40 mg total) by mouth daily. 30 capsule 11  . POTASSIUM PO Take by mouth.    . Probiotic Product (ALIGN PO) Take by mouth.    . Psyllium (METAMUCIL PO) Take by mouth daily.    . Pyridoxine HCl (B-6 PO) Take by mouth.    .  spironolactone-hydrochlorothiazide (ALDACTAZIDE) 25-25 MG per tablet TAKE ONE TABLET EACH DAY 30 tablet 5  . VITAMIN D PO Take 2,000 Int'l Units by mouth.    . rosuvastatin (CRESTOR) 5 MG tablet Take 5 mg by mouth daily. (Patient not taking: Reported on 12/17/2019)     No current facility-administered medications for this visit.    Family History  Problem Relation Age of Onset  . Lung cancer Sister   . Osteoporosis Mother   . Heart disease Father   . Diabetes Paternal Uncle   . Ovarian cancer Maternal Grandmother   . Breast cancer Sister 32  . Colon cancer Neg Hx   . Stomach cancer Neg Hx   . Esophageal cancer Neg Hx   . Rectal cancer Neg Hx     Review of Systems  Constitutional: Negative.   HENT: Negative.   Eyes: Negative.   Respiratory: Negative.   Cardiovascular: Negative.   Gastrointestinal: Negative.   Endocrine: Negative.   Genitourinary: Negative.   Musculoskeletal: Negative.   Skin: Negative.   Allergic/Immunologic: Negative.   Neurological: Negative.   Hematological: Negative.   Psychiatric/Behavioral: Negative.     Exam:   BP 110/68   Pulse 68   Resp 16   Ht 5' 3.5" (1.613 m)   Wt 160 lb (72.6 kg)   LMP 04/02/1988   BMI 27.90 kg/m   Height: 5' 3.5" (161.3 cm)  General appearance: alert, cooperative and appears stated age Breasts: normal appearance, no masses or tenderness Abdomen: soft, non-tender; bowel sounds normal; no masses,  no organomegaly Lymph nodes: Cervical, supraclavicular, and axillary nodes normal.  No abnormal inguinal nodes palpated Neurologic: Grossly normal  Pelvic: External genitalia:  no lesions but inner labia majora erythema noted              Urethra:  normal appearing urethra with no masses, tenderness or lesions              Bartholins and Skenes: normal                 Vagina: normal appearing vagina with normal color and discharge, no lesions              Cervix: absent              Pap taken: No. Bimanual Exam:  Uterus:   uterus absent              Adnexa:  no mass, fullness, tenderness               Rectovaginal: Confirms               Anus:  normal sphincter tone, no lesions  Chaperone, Olene Floss, CMA, was present for exam.  A:  Breast and Pelvic exam Insomnia On low dosed HRT Low back pain, urinary urgency, vulvar irritation/erythema Grief reaction  P:   Mammogram guidelines reviewed pap smear not indicated Vaginitis testing obtained today.  Urine culture obtained today as well. Colonoscopy, BMD, lab work all managed by Dr. Jackelyn Poling RF for estrace tablets 0.5mg  tabs, 1/2 tab every other day.  #30/12 RF Pt treated for yeast today with both diflucan and Terazol.  She will be called if other treatment is needed. return annually or prn  Total time with pt: 35 minutes

## 2019-12-17 ENCOUNTER — Ambulatory Visit (INDEPENDENT_AMBULATORY_CARE_PROVIDER_SITE_OTHER): Payer: Medicare Other | Admitting: Obstetrics & Gynecology

## 2019-12-17 ENCOUNTER — Other Ambulatory Visit: Payer: Self-pay | Admitting: Obstetrics & Gynecology

## 2019-12-17 ENCOUNTER — Encounter: Payer: Self-pay | Admitting: Obstetrics & Gynecology

## 2019-12-17 ENCOUNTER — Other Ambulatory Visit: Payer: Self-pay

## 2019-12-17 VITALS — BP 110/68 | HR 68 | Resp 16 | Ht 63.5 in | Wt 160.0 lb

## 2019-12-17 DIAGNOSIS — N898 Other specified noninflammatory disorders of vagina: Secondary | ICD-10-CM | POA: Diagnosis not present

## 2019-12-17 DIAGNOSIS — M545 Low back pain, unspecified: Secondary | ICD-10-CM

## 2019-12-17 DIAGNOSIS — Z79890 Hormone replacement therapy (postmenopausal): Secondary | ICD-10-CM | POA: Diagnosis not present

## 2019-12-17 DIAGNOSIS — N3000 Acute cystitis without hematuria: Secondary | ICD-10-CM

## 2019-12-17 DIAGNOSIS — R319 Hematuria, unspecified: Secondary | ICD-10-CM | POA: Diagnosis not present

## 2019-12-17 DIAGNOSIS — Z01419 Encounter for gynecological examination (general) (routine) without abnormal findings: Secondary | ICD-10-CM

## 2019-12-17 DIAGNOSIS — N390 Urinary tract infection, site not specified: Secondary | ICD-10-CM | POA: Diagnosis not present

## 2019-12-17 DIAGNOSIS — Z9189 Other specified personal risk factors, not elsewhere classified: Secondary | ICD-10-CM | POA: Diagnosis not present

## 2019-12-17 LAB — POCT URINALYSIS DIPSTICK
Bilirubin, UA: NEGATIVE
Blood, UA: NEGATIVE
Glucose, UA: NEGATIVE
Ketones, UA: NEGATIVE
Leukocytes, UA: NEGATIVE
Nitrite, UA: NEGATIVE
Protein, UA: NEGATIVE
Urobilinogen, UA: NEGATIVE E.U./dL — AB
pH, UA: 5 (ref 5.0–8.0)

## 2019-12-17 MED ORDER — FLUCONAZOLE 150 MG PO TABS: ORAL_TABLET | ORAL | 0 refills | Status: DC

## 2019-12-17 MED ORDER — ESTRADIOL 0.5 MG PO TABS: ORAL_TABLET | ORAL | 3 refills | Status: DC

## 2019-12-17 MED ORDER — TERCONAZOLE 0.4 % VA CREA: TOPICAL_CREAM | VAGINAL | 0 refills | Status: DC

## 2019-12-18 LAB — VAGINITIS/VAGINOSIS, DNA PROBE
Candida Species: POSITIVE — AB
Gardnerella vaginalis: NEGATIVE
Trichomonas vaginosis: NEGATIVE

## 2019-12-19 LAB — URINE CULTURE

## 2020-01-15 ENCOUNTER — Ambulatory Visit: Payer: Medicare Other | Admitting: Obstetrics & Gynecology

## 2020-01-27 ENCOUNTER — Other Ambulatory Visit: Payer: Self-pay

## 2020-01-27 ENCOUNTER — Ambulatory Visit (INDEPENDENT_AMBULATORY_CARE_PROVIDER_SITE_OTHER): Payer: Medicare Other | Admitting: Cardiovascular Disease

## 2020-01-27 ENCOUNTER — Telehealth: Payer: Self-pay | Admitting: Radiology

## 2020-01-27 ENCOUNTER — Encounter: Payer: Self-pay | Admitting: Cardiovascular Disease

## 2020-01-27 VITALS — BP 130/84 | HR 66 | Ht 65.0 in | Wt 164.2 lb

## 2020-01-27 DIAGNOSIS — I491 Atrial premature depolarization: Secondary | ICD-10-CM

## 2020-01-27 DIAGNOSIS — I10 Essential (primary) hypertension: Secondary | ICD-10-CM | POA: Diagnosis not present

## 2020-01-27 DIAGNOSIS — R9431 Abnormal electrocardiogram [ECG] [EKG]: Secondary | ICD-10-CM | POA: Diagnosis not present

## 2020-01-27 DIAGNOSIS — R0602 Shortness of breath: Secondary | ICD-10-CM | POA: Diagnosis not present

## 2020-01-27 NOTE — Patient Instructions (Addendum)
Medication Instructions:  Your provider recommends that you continue on your current medications as directed. Please refer to the Current Medication list given to you today.  *If you need a refill on your cardiac medications before your next appointment, please call your pharmacy*  Testing/Procedures: Dr. Burt Knack recommends you wear a Zio (heart monitor) for 3 days. This will be mailed to your home.  Your provider has requested that you have an echocardiogram. Echocardiography is a painless test that uses sound waves to create images of your heart. It provides your doctor with information about the size and shape of your heart and how well your heart's chambers and valves are working. This procedure takes approximately one hour. There are no restrictions for this procedure.   Follow-Up: At [facility], you and your health needs are our priority.  As part of our continuing mission to provide you with exceptional heart care, we have created designated Provider Care Teams.  These Care Teams include your primary Cardiologist (physician) and Advanced Practice Providers (APPs -  Physician Assistants and Nurse Practitioners) who all work together to provide you with the care you need, when you need it. Your next appointment:   12 month(s) The format for your next appointment:   In Person Provider:   You may see [name], MD or one of the following Advanced Practice Providers on your designated Care Team:    Richardson Dopp, PA-C  Vin Bhagat, PA-C    [place] Monitor Instructions   Your physician has requested you wear your ZIO patch monitor 3 days.   This is a single patch monitor.  [employer] supplies one patch monitor per enrollment.  Additional stickers are not available.   Please do not apply patch if you will be having a Nuclear Stress Test, Echocardiogram, Cardiac CT, MRI, or Chest Xray during the time frame you would be wearing the monitor. The patch cannot be worn during  these tests.  You cannot remove and re-apply the ZIO XT patch monitor.   Your ZIO patch monitor will be sent USPS Priority mail from [facility] directly to your home address. The monitor may also be mailed to a PO BOX if home delivery is not available.   It may take 3-5 days to receive your monitor after you have been enrolled.   Once you have received you monitor, please review enclosed instructions.  Your monitor has already been registered assigning a specific monitor serial # to you.   Applying the monitor   Shave hair from upper left chest.   Hold abrader disc by orange tab.  Rub abrader in 40 strokes over left upper chest as indicated in your monitor instructions.   Clean area with 4 enclosed alcohol pads .  Use all pads to assure are is cleaned thoroughly.  Let dry.   Apply patch as indicated in monitor instructions.  Patch will be place under collarbone on left side of chest with arrow pointing upward.   Rub patch adhesive wings for 2 minutes.Remove white label marked "1".  Remove white label marked "2".  Rub patch adhesive wings for 2 additional minutes.   While looking in a mirror, press and release button in center of patch.  A small green light will flash 3-4 times .  This will be your only indicator the monitor has been turned on.     Do not shower for the first 24 hours.  You may shower after the first 24 hours.   Press  button if you feel a symptom. You will hear a small click.  Record Date, Time and Symptom in the Patient Log Book.   When you are ready to remove patch, follow instructions on last 2 pages of Patient Log Book.  Stick patch monitor onto last page of Patient Log Book.   Place Patient Log Book in [place] box.  Use locking tab on box and tape box closed securely.  The [employer] and [employer] has [employer] on it.  Please place in mailbox as soon as possible.  Your physician should have your test results approximately 7 days after the monitor has been  mailed back to [facility]   Call [place] at [phone] if you have questions regarding your ZIO XT patch monitor.  Call them immediately if you see an orange light blinking on your monitor.   If your monitor falls off in less than 4 days contact our Monitor department at [phone].  If your monitor becomes loose or falls off after 4 days call [employer] at [phone] for suggestions on securing your monitor.

## 2020-01-27 NOTE — Progress Notes (Signed)
Cardiology Office Note:    Date:  01/28/2020   ID:  Brittney Fox, DOB 17-May-1940, MRN [id number]  PCP:  Velna Hatchet, MD  [facility] Cardiologist:  [name], MD  [place] Electrophysiologist:  None   Referring MD: Velna Hatchet, MD   Chief Complaint  Patient presents with  . Shortness of Breath    History of Present Illness:    Brittney Fox is a 79 y.o. female with a hx of type 2 diabetes, venous insufficiency, and diastolic dysfunction, presents for follow-up of shortness of breath.  Brittney Fox is here alone today.  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  She is still grieving the loss of her husband who passed away earlier this year.  She recently was in [place] and noted that she had trouble keeping up with some of her friends.  She was short of breath with walking.  She does have a degree of chronic shortness of breath but states that it was more pronounced in the altitude.  She does not have chest pain or pressure, orthopnea, or PND.  No heart palpitations, lightheadedness, or syncope.  She has some chronic leg swelling related to venous insufficiency and this is unchanged over time.  Past Medical History:  Diagnosis Date  . Anal fissure    as a child  . Blood transfusion without reported diagnosis   . Diabetes mellitus    managed by diet and exercise  . Diverticulosis of colon (without mention of hemorrhage)   . Fatty liver   . GERD (gastroesophageal reflux disease)   . Heart murmur   . Hyperlipidemia   . Irritable bowel syndrome   . Lymphocytic colitis    chronic  . Neuromuscular disorder (HCC)    neuropathy  . Osteoarthritis   . Osteopenia 09/2007  . Other mucopurulent conjunctivitis   . Premature ventricular contractions   . Raynaud's syndrome   . Rosacea    opitcal  . Spinal stenosis     Past Surgical History:  Procedure Laterality Date  . CATARACT EXTRACTION Bilateral   . CHOLECYSTECTOMY    . COLONOSCOPY    .  dental implants    . DILATION AND CURETTAGE OF UTERUS  1990  . FOOT SURGERY    . HIP SURGERY Bilateral 1973  . Jaw Implants  2007   2018.2019  . KNEE ARTHROSCOPY Right   . lens implant    . ovarian wedge resection  1964  . REPLACEMENT TOTAL KNEE Bilateral 2005, 2007  . TOTAL ABDOMINAL HYSTERECTOMY W/ BILATERAL SALPINGOOPHORECTOMY  1990   Excessive Bleeding   . TOTAL KNEE ARTHROPLASTY Left 08/2003  . TOTAL KNEE ARTHROPLASTY Right 08/2005    Current Medications: Current Meds  Medication Sig  . aspirin EC 81 MG tablet Take 1 tablet (81 mg total) by mouth daily.  Marland Kitchen dicyclomine (BENTYL) 20 MG tablet Take 1 tablet (20 mg total) by mouth 2 (two) times daily.  . diphenoxylate-atropine (LOMOTIL) 2.5-0.025 MG per tablet TAKE ONE (1) TABLET FOUR TIMES EACH DAY AS NEEDED FOR DIARRHEA  . estradiol (ESTRACE) 0.5 MG tablet Take 1/2 tab every other day  . FIBER PO Take by mouth.  . fluconazole (DIFLUCAN) 150 MG tablet Take 1 and repeat in 72 hours.  Marland Kitchen JARDIANCE 10 MG TABS tablet Take 1 tablet by mouth daily.  . naproxen sodium (ALEVE) 220 MG tablet Take 220 mg by mouth as directed. PRN  . omeprazole (PRILOSEC) 40 MG capsule Take 1 capsule (40 mg  total) by mouth daily.  Marland Kitchen POTASSIUM PO Take by mouth.  . Probiotic Product (ALIGN PO) Take by mouth.  . Psyllium (METAMUCIL PO) Take by mouth daily.  . Pyridoxine HCl (B-6 PO) Take by mouth.  . spironolactone-hydrochlorothiazide (ALDACTAZIDE) 25-25 MG per tablet TAKE ONE TABLET EACH DAY  . terconazole (TERAZOL 7) 0.4 % vaginal cream Apply externally twice daily for 7 days.  Marland Kitchen VITAMIN D PO Take 2,000 Int'l Units by mouth.     Allergies:   Amoxicillin, Codeine phosphate, and Morphine sulfate   Social History   Socioeconomic History  . Marital status: Widowed    Spouse name: Not on file  . Number of children: 2  . Years of education: Not on file  . Highest education level: Not on file  Occupational History  . Occupation: REAL ESTATE    Employer:  [employer]  Tobacco Use  . Smoking status: Former Smoker    Quit date: 10/22/1961    Years since quitting: 58.3  . Smokeless tobacco: Never Used  Vaping Use  . Vaping Use: Never used  Substance and Sexual Activity  . Alcohol use: No    Alcohol/week: 0.0 standard drinks  . Drug use: No  . Sexual activity: Not Currently    Partners: Male    Birth control/protection: Surgical    Comment: hysterectomy  Other Topics Concern  . Not on file  Social History Narrative  . Not on file   Social Determinants of Health   Financial Resource Strain:   . Difficulty of Paying Living Expenses: Not on file  Food Insecurity:   . Worried About Charity fundraiser in the Last Year: Not on file  . Ran Out of Food in the Last Year: Not on file  Transportation Needs:   . Lack of Transportation (Medical): Not on file  . Lack of Transportation (Non-Medical): Not on file  Physical Activity:   . Days of Exercise per Week: Not on file  . Minutes of Exercise per Session: Not on file  Stress:   . Feeling of Stress : Not on file  Social Connections:   . Frequency of Communication with Friends and Family: Not on file  . Frequency of Social Gatherings with Friends and Family: Not on file  . Attends Religious Services: Not on file  . Active Member of Clubs or Organizations: Not on file  . Attends Archivist Meetings: Not on file  . Marital Status: Not on file     Family History: The patient's family history includes Breast cancer (age of onset: 104) in her sister; Diabetes in her paternal uncle; Heart disease in her father; Lung cancer in her sister; Osteoporosis in her mother; Ovarian cancer in her maternal grandmother. There is no history of Colon cancer, Stomach cancer, Esophageal cancer, or Rectal cancer.  ROS:   Please see the history of present illness.    All other systems reviewed and are negative.  EKGs/Labs/Other Studies Reviewed:    The following studies were  reviewed today: Echo 08/14/2016: Study Conclusions   - Left ventricle: The cavity size was normal. There was mild focal  basal hypertrophy of the septum. Systolic function was normal.  The estimated ejection fraction was in the range of 50% to 55%.  Wall motion was normal; there were no regional wall motion  abnormalities. Features are consistent with a pseudonormal left  ventricular filling pattern, with concomitant abnormal relaxation  and increased filling pressure (grade 2 diastolic dysfunction).  -  Aortic valve: There was trivial regurgitation.  - Mitral valve: Calcified annulus. There was mild regurgitation.  - Atrial septum: There was a possible patent foramen ovale.  - Pulmonary arteries: Systolic pressure was mildly increased. PA  peak pressure: 34 mm Hg (S).   Impressions:   - Normal LV systolic function; grade 2 diastolic dysfunction; trace  AI; mild MR; mild TR with mildly elevated pulmonary pressure.   EKG:  EKG is ordered today.  The ekg ordered today demonstrates normal sinus rhythm with frequent PACs and marked sinus arrhythmia  Recent Labs: No results found for requested labs within last 8760 hours.  Recent Lipid Panel    Component Value Date/Time   CHOL 144 09/13/2015 0734   TRIG 87 09/13/2015 0734   TRIG 88 02/06/2006 1028   HDL 59 09/13/2015 0734   CHOLHDL 2.4 09/13/2015 0734   VLDL 17 09/13/2015 0734   LDLCALC 68 09/13/2015 0734   LDLDIRECT 150.2 11/28/2010 1131     Risk Assessment/Calculations:       Physical Exam:    VS:  BP 130/84   Pulse 66   Ht 5\' 5"  (1.651 m)   Wt 164 lb 3.2 oz (74.5 kg)   LMP 04/02/1988   SpO2 99%   BMI 27.32 kg/m     Wt Readings from Last 3 Encounters:  01/27/20 164 lb 3.2 oz (74.5 kg)  12/17/19 160 lb (72.6 kg)  07/23/19 162 lb (73.5 kg)     GEN:  Well nourished, well developed in no acute distress HEENT: Normal NECK: No JVD; No carotid bruits LYMPHATICS: No lymphadenopathy CARDIAC: RRR, no  murmurs, rubs, gallops RESPIRATORY:  Clear to auscultation without rales, wheezing or rhonchi  ABDOMEN: Soft, non-tender, non-distended MUSCULOSKELETAL: Mild bilateral lower extremity edema; No deformity  SKIN: Warm and dry NEUROLOGIC:  Alert and oriented x 3 PSYCHIATRIC:  Normal affect   ASSESSMENT:    1. Abnormal EKG   2. Shortness of breath   3. PAC (premature atrial contraction)   4. Essential hypertension    PLAN:    In order of problems listed above:  1. The patient has shortness of breath with frequent atrial ectopy.  We will check a ZIO monitor to evaluate for the presence of atrial fibrillation or other significant arrhythmia. 2. Somewhat chronic problem.  Patient with history of diastolic dysfunction.  Blood pressure is currently well controlled and no exam evidence of volume excess.  Last echo from 2018 reviewed.  Will update. 3. As above 4. Well-controlled on Aldactazide   Medication Adjustments/Labs and Tests Ordered: Current medicines are reviewed at length with the patient today.  Concerns regarding medicines are outlined above.  Orders Placed This Encounter  Procedures  . LONG TERM MONITOR (3-14 DAYS)  . EKG 12-Lead  . ECHOCARDIOGRAM COMPLETE   No orders of the defined types were placed in this encounter.   Patient Instructions  Medication Instructions:  Your provider recommends that you continue on your current medications as directed. Please refer to the Current Medication list given to you today.  *If you need a refill on your cardiac medications before your next appointment, please call your pharmacy*  Testing/Procedures: Dr. Burt Knack recommends you wear a Zio (heart monitor) for 3 days. This will be mailed to your home.  Your provider has requested that you have an echocardiogram. Echocardiography is a painless test that uses sound waves to create images of your heart. It provides your doctor with information about the size and shape of your heart  and  how well your heart's chambers and valves are working. This procedure takes approximately one hour. There are no restrictions for this procedure.   Follow-Up: At [facility], you and your health needs are our priority.  As part of our continuing mission to provide you with exceptional heart care, we have created designated Provider Care Teams.  These Care Teams include your primary Cardiologist (physician) and Advanced Practice Providers (APPs -  Physician Assistants and Nurse Practitioners) who all work together to provide you with the care you need, when you need it. Your next appointment:   12 month(s) The format for your next appointment:   In Person Provider:   You may see [name], MD or one of the following Advanced Practice Providers on your designated Care Team:    Richardson Dopp, PA-C  Vin Bhagat, PA-C    [place] Monitor Instructions   Your physician has requested you wear your ZIO patch monitor 3 days.   This is a single patch monitor.  [employer] supplies one patch monitor per enrollment.  Additional stickers are not available.   Please do not apply patch if you will be having a Nuclear Stress Test, Echocardiogram, Cardiac CT, MRI, or Chest Xray during the time frame you would be wearing the monitor. The patch cannot be worn during these tests.  You cannot remove and re-apply the ZIO XT patch monitor.   Your ZIO patch monitor will be sent USPS Priority mail from [facility] directly to your home address. The monitor may also be mailed to a PO BOX if home delivery is not available.   It may take 3-5 days to receive your monitor after you have been enrolled.   Once you have received you monitor, please review enclosed instructions.  Your monitor has already been registered assigning a specific monitor serial # to you.   Applying the monitor   Shave hair from upper left chest.   Hold abrader disc by orange tab.  Rub abrader in 40 strokes over left  upper chest as indicated in your monitor instructions.   Clean area with 4 enclosed alcohol pads .  Use all pads to assure are is cleaned thoroughly.  Let dry.   Apply patch as indicated in monitor instructions.  Patch will be place under collarbone on left side of chest with arrow pointing upward.   Rub patch adhesive wings for 2 minutes.Remove white label marked "1".  Remove white label marked "2".  Rub patch adhesive wings for 2 additional minutes.   While looking in a mirror, press and release button in center of patch.  A small green light will flash 3-4 times .  This will be your only indicator the monitor has been turned on.     Do not shower for the first 24 hours.  You may shower after the first 24 hours.   Press button if you feel a symptom. You will hear a small click.  Record Date, Time and Symptom in the Patient Log Book.   When you are ready to remove patch, follow instructions on last 2 pages of Patient Log Book.  Stick patch monitor onto last page of Patient Log Book.   Place Patient Log Book in Corn box.  Use locking tab on box and tape box closed securely.  The [employer] has [employer] on it.  Please place in mailbox as soon as possible.  Your physician should have your test results approximately 7  days after the monitor has been mailed back to [place].   Call [place] at [phone] if you have questions regarding your ZIO XT patch monitor.  Call them immediately if you see an orange light blinking on your monitor.   If your monitor falls off in less than 4 days contact our Monitor department at [phone].  If your monitor becomes loose or falls off after 4 days call [employer] at [phone] for suggestions on securing your monitor.      Signed, [name], MD  01/28/2020 6:25 AM    [place]

## 2020-01-27 NOTE — Telephone Encounter (Signed)
Enrolled patient for a 3 day Zio XT Monitor to be mailed to patients home.  °

## 2020-01-28 ENCOUNTER — Encounter: Payer: Self-pay | Admitting: Cardiovascular Disease

## 2020-02-05 ENCOUNTER — Ambulatory Visit (INDEPENDENT_AMBULATORY_CARE_PROVIDER_SITE_OTHER): Payer: Medicare Other

## 2020-02-05 DIAGNOSIS — I491 Atrial premature depolarization: Secondary | ICD-10-CM

## 2020-02-05 DIAGNOSIS — R0602 Shortness of breath: Secondary | ICD-10-CM | POA: Diagnosis not present

## 2020-02-05 DIAGNOSIS — R9431 Abnormal electrocardiogram [ECG] [EKG]: Secondary | ICD-10-CM

## 2020-02-11 ENCOUNTER — Other Ambulatory Visit: Payer: Self-pay | Admitting: Cardiovascular Disease

## 2020-02-11 NOTE — Telephone Encounter (Signed)
Pt's pharmacy is requesting a refill on potassium. Would Dr. Burt Knack like to refill this medication? Please address

## 2020-02-11 NOTE — Telephone Encounter (Signed)
Called pt and pt stated that she takes 2 of potassium 10 meq daily. I sent pt's medication to pt's pharmacy as requested. Confirmation received.

## 2020-02-16 DIAGNOSIS — I491 Atrial premature depolarization: Secondary | ICD-10-CM | POA: Diagnosis not present

## 2020-02-18 ENCOUNTER — Ambulatory Visit (HOSPITAL_COMMUNITY): Payer: Medicare Other | Attending: Cardiovascular Disease

## 2020-02-18 ENCOUNTER — Other Ambulatory Visit: Payer: Self-pay

## 2020-02-18 DIAGNOSIS — I491 Atrial premature depolarization: Secondary | ICD-10-CM | POA: Diagnosis not present

## 2020-02-18 DIAGNOSIS — R0602 Shortness of breath: Secondary | ICD-10-CM | POA: Diagnosis not present

## 2020-02-18 DIAGNOSIS — R9431 Abnormal electrocardiogram [ECG] [EKG]: Secondary | ICD-10-CM | POA: Diagnosis not present

## 2020-02-18 LAB — ECHOCARDIOGRAM COMPLETE
Area-P 1/2: 2.4 cm2
S' Lateral: 2.2 cm

## 2020-03-08 DIAGNOSIS — Z23 Encounter for immunization: Secondary | ICD-10-CM | POA: Diagnosis not present

## 2020-03-08 DIAGNOSIS — G4700 Insomnia, unspecified: Secondary | ICD-10-CM | POA: Diagnosis not present

## 2020-03-08 DIAGNOSIS — E119 Type 2 diabetes mellitus without complications: Secondary | ICD-10-CM | POA: Diagnosis not present

## 2020-03-16 DIAGNOSIS — E1169 Type 2 diabetes mellitus with other specified complication: Secondary | ICD-10-CM | POA: Diagnosis not present

## 2020-03-16 DIAGNOSIS — I10 Essential (primary) hypertension: Secondary | ICD-10-CM | POA: Diagnosis not present

## 2020-03-21 DIAGNOSIS — L821 Other seborrheic keratosis: Secondary | ICD-10-CM | POA: Diagnosis not present

## 2020-03-21 DIAGNOSIS — L570 Actinic keratosis: Secondary | ICD-10-CM | POA: Diagnosis not present

## 2020-03-30 ENCOUNTER — Other Ambulatory Visit: Payer: Self-pay | Admitting: Internal Medicine

## 2020-03-30 DIAGNOSIS — Z1231 Encounter for screening mammogram for malignant neoplasm of breast: Secondary | ICD-10-CM

## 2020-04-28 DIAGNOSIS — S098XXA Other specified injuries of head, initial encounter: Secondary | ICD-10-CM | POA: Diagnosis not present

## 2020-04-28 DIAGNOSIS — K219 Gastro-esophageal reflux disease without esophagitis: Secondary | ICD-10-CM | POA: Diagnosis not present

## 2020-04-28 DIAGNOSIS — I10 Essential (primary) hypertension: Secondary | ICD-10-CM | POA: Diagnosis not present

## 2020-04-28 DIAGNOSIS — E785 Hyperlipidemia, unspecified: Secondary | ICD-10-CM | POA: Diagnosis not present

## 2020-04-28 DIAGNOSIS — E119 Type 2 diabetes mellitus without complications: Secondary | ICD-10-CM | POA: Diagnosis not present

## 2020-04-28 DIAGNOSIS — S0990XA Unspecified injury of head, initial encounter: Secondary | ICD-10-CM | POA: Diagnosis not present

## 2020-04-28 DIAGNOSIS — S0003XA Contusion of scalp, initial encounter: Secondary | ICD-10-CM | POA: Diagnosis not present

## 2020-04-28 DIAGNOSIS — W19XXXA Unspecified fall, initial encounter: Secondary | ICD-10-CM | POA: Diagnosis not present

## 2020-05-09 ENCOUNTER — Other Ambulatory Visit: Payer: Self-pay | Admitting: Internal Medicine

## 2020-05-09 DIAGNOSIS — K219 Gastro-esophageal reflux disease without esophagitis: Secondary | ICD-10-CM

## 2020-05-10 ENCOUNTER — Other Ambulatory Visit: Payer: Self-pay

## 2020-05-10 ENCOUNTER — Ambulatory Visit
Admission: RE | Admit: 2020-05-10 | Discharge: 2020-05-10 | Disposition: A | Discharge status: Home / Self Care | Payer: Medicare Other | Source: Ambulatory Visit | Attending: Internal Medicine | Admitting: Internal Medicine

## 2020-05-10 DIAGNOSIS — Z1231 Encounter for screening mammogram for malignant neoplasm of breast: Secondary | ICD-10-CM | POA: Diagnosis not present

## 2020-05-13 ENCOUNTER — Other Ambulatory Visit: Payer: Self-pay | Admitting: Internal Medicine

## 2020-05-13 DIAGNOSIS — R928 Other abnormal and inconclusive findings on diagnostic imaging of breast: Secondary | ICD-10-CM

## 2020-05-26 ENCOUNTER — Ambulatory Visit
Admission: RE | Admit: 2020-05-26 | Discharge: 2020-05-26 | Disposition: A | Discharge status: Home / Self Care | Payer: Medicare Other | Source: Ambulatory Visit | Attending: Internal Medicine | Admitting: Internal Medicine

## 2020-05-26 ENCOUNTER — Ambulatory Visit: Payer: Medicare Other

## 2020-05-26 ENCOUNTER — Other Ambulatory Visit: Payer: Self-pay

## 2020-05-26 DIAGNOSIS — R928 Other abnormal and inconclusive findings on diagnostic imaging of breast: Secondary | ICD-10-CM | POA: Diagnosis not present

## 2020-05-26 DIAGNOSIS — R922 Inconclusive mammogram: Secondary | ICD-10-CM | POA: Diagnosis not present

## 2020-06-09 DIAGNOSIS — E119 Type 2 diabetes mellitus without complications: Secondary | ICD-10-CM | POA: Diagnosis not present

## 2020-06-09 DIAGNOSIS — H26491 Other secondary cataract, right eye: Secondary | ICD-10-CM | POA: Diagnosis not present

## 2020-06-09 DIAGNOSIS — Z961 Presence of intraocular lens: Secondary | ICD-10-CM | POA: Diagnosis not present

## 2020-08-03 DIAGNOSIS — I10 Essential (primary) hypertension: Secondary | ICD-10-CM | POA: Diagnosis not present

## 2020-08-03 DIAGNOSIS — E1169 Type 2 diabetes mellitus with other specified complication: Secondary | ICD-10-CM | POA: Diagnosis not present

## 2020-08-03 DIAGNOSIS — G4700 Insomnia, unspecified: Secondary | ICD-10-CM | POA: Diagnosis not present

## 2020-08-03 DIAGNOSIS — R61 Generalized hyperhidrosis: Secondary | ICD-10-CM | POA: Diagnosis not present

## 2020-08-03 DIAGNOSIS — E785 Hyperlipidemia, unspecified: Secondary | ICD-10-CM | POA: Diagnosis not present

## 2020-08-03 DIAGNOSIS — M25571 Pain in right ankle and joints of right foot: Secondary | ICD-10-CM | POA: Diagnosis not present

## 2020-09-09 DIAGNOSIS — Z23 Encounter for immunization: Secondary | ICD-10-CM | POA: Diagnosis not present

## 2020-10-08 ENCOUNTER — Other Ambulatory Visit: Payer: Self-pay | Admitting: Internal Medicine

## 2020-11-02 ENCOUNTER — Other Ambulatory Visit: Payer: Self-pay

## 2020-11-02 ENCOUNTER — Ambulatory Visit (INDEPENDENT_AMBULATORY_CARE_PROVIDER_SITE_OTHER): Payer: Medicare Other

## 2020-11-02 ENCOUNTER — Ambulatory Visit (INDEPENDENT_AMBULATORY_CARE_PROVIDER_SITE_OTHER): Payer: Medicare Other | Admitting: Cardiovascular Disease

## 2020-11-02 ENCOUNTER — Encounter: Payer: Self-pay | Admitting: Cardiovascular Disease

## 2020-11-02 VITALS — BP 118/70 | HR 87 | Ht 65.0 in | Wt 167.0 lb

## 2020-11-02 DIAGNOSIS — I4891 Unspecified atrial fibrillation: Secondary | ICD-10-CM

## 2020-11-02 DIAGNOSIS — R9431 Abnormal electrocardiogram [ECG] [EKG]: Secondary | ICD-10-CM

## 2020-11-02 MED ORDER — APIXABAN 5 MG PO TABS
5.0000 mg | ORAL_TABLET | Freq: Two times a day (BID) | ORAL | 5 refills | Status: DC
Start: 2020-11-02 — End: 2021-05-29

## 2020-11-02 NOTE — Addendum Note (Signed)
Addended by: Rodman Key on: 11/02/2020 03:28 PM   Modules accepted: Orders

## 2020-11-02 NOTE — Progress Notes (Unsigned)
Patient enrolled for [employer] to mail a 3 day ZIO XT monitor to her address on file.

## 2020-11-02 NOTE — Progress Notes (Signed)
Chief Complaint  Patient presents with   Follow-up    Atrial fibrillation    History of Present Illness: 80 yo female with history of type 2 diabetes, GERD, hyperlipidemia, venous insufficiency, chronic diastolic dysfunction and PVCs who is followed in our office by Dr. Burt Knack. She came into the office today for a nurse room visit after she noted irregularity of her heart rhythm at home on her watch. EKG today in our office with atrial fibrillation/flutter. She has had a long history of palpitations. Cardiac monitor November 2021 with sinus, rare PVCs, PACs and short runs of SVT. Echo November 2021 with LVEF=55-60%, trivial MR. Mild dilation both atria.   She tells me today that she feels fine. She only noticed the irregularity on her watch today. No chest pain, dyspnea, weakness, dizziness or near syncope.   Primary Care Physician: Velna Hatchet, MD Primary Cardiologist: Dr. Burt Knack  Past Medical History:  Diagnosis Date   Anal fissure    as a child   Blood transfusion without reported diagnosis    Diabetes mellitus    managed by diet and exercise   Diverticulosis of colon (without mention of hemorrhage)    Fatty liver    GERD (gastroesophageal reflux disease)    Heart murmur    Hyperlipidemia    Irritable bowel syndrome    Lymphocytic colitis    chronic   Neuromuscular disorder ([place])    neuropathy   Osteoarthritis    Osteopenia 09/2007   Other mucopurulent conjunctivitis    Premature ventricular contractions    Raynaud's syndrome    Rosacea    opitcal   Spinal stenosis     Past Surgical History:  Procedure Laterality Date   CATARACT EXTRACTION Bilateral    CHOLECYSTECTOMY     COLONOSCOPY     dental implants     [place] Bilateral 1973   Jaw Implants  2007   2018.2019   KNEE ARTHROSCOPY Right    lens implant     ovarian wedge resection  1964   REPLACEMENT TOTAL KNEE Bilateral 2005, 2007   TOTAL  ABDOMINAL HYSTERECTOMY W/ BILATERAL SALPINGOOPHORECTOMY  1990   Excessive Bleeding    TOTAL KNEE ARTHROPLASTY Left 08/2003   TOTAL KNEE ARTHROPLASTY Right 08/2005    Current Outpatient Medications  Medication Sig Dispense Refill   apixaban (ELIQUIS) 5 MG TABS tablet Take 1 tablet (5 mg total) by mouth 2 (two) times daily. 60 tablet 5   dicyclomine (BENTYL) 20 MG tablet TAKE ONE TABLET EVERY 6 HOURS AS NEEDED 90 tablet 0   estradiol (ESTRACE) 0.5 MG tablet Take 1/2 tab every other day 45 tablet 3   FIBER PO Take by mouth.     JARDIANCE 10 MG TABS tablet Take 1 tablet by mouth daily.     naproxen sodium (ALEVE) 220 MG tablet Take 220 mg by mouth as directed. PRN     omeprazole (PRILOSEC) 40 MG capsule TAKE ONE CAPSULE EACH DAY 90 capsule 3   potassium chloride (KLOR-CON) 10 MEQ tablet Take 2 tablets (20 mEq total) by mouth daily. 180 tablet 3   Probiotic Product (ALIGN PO) Take by mouth.     Psyllium (METAMUCIL PO) Take by mouth daily.     Pyridoxine HCl (B-6 PO) Take by mouth.     spironolactone-hydrochlorothiazide (ALDACTAZIDE) 25-25 MG per tablet TAKE ONE TABLET EACH DAY 30 tablet 5   diphenoxylate-atropine (LOMOTIL) 2.5-0.025  MG per tablet TAKE ONE (1) TABLET FOUR TIMES EACH DAY AS NEEDED FOR DIARRHEA (Patient not taking: Reported on 11/02/2020) 30 tablet 0   gabapentin (NEURONTIN) 100 MG capsule Take by mouth.     terconazole (TERAZOL 7) 0.4 % vaginal cream Apply externally twice daily for 7 days. (Patient not taking: Reported on 11/02/2020) 45 g 0   No current facility-administered medications for this visit.    Allergies  Allergen Reactions   Amoxicillin Other (See Comments)    Ulcers and thrush   Codeine Phosphate     REACTION: headache   Morphine Sulfate     REACTION: tongue swelling    Social History   Socioeconomic History   Marital status: Widowed    Spouse name: Not on file   Number of children: 2   Years of education: Not on file   Highest education level: Not on  file  Occupational History   Occupation: REAL ESTATE    Employer: [employer]  Tobacco Use   Smoking status: Former    Types: Cigarettes    Quit date: 10/22/1961    Years since quitting: 59.0   Smokeless tobacco: Never  Vaping Use   Vaping Use: Never used  Substance and Sexual Activity   Alcohol use: No    Alcohol/week: 0.0 standard drinks   Drug use: No   Sexual activity: Not Currently    Partners: Male    Birth control/protection: Surgical    Comment: hysterectomy  Other Topics Concern   Not on file  Social History Narrative   Not on file   Social Determinants of Health   Financial Resource Strain: Not on file  Food Insecurity: Not on file  Transportation Needs: Not on file  Physical Activity: Not on file  Stress: Not on file  Social Connections: Not on file  Intimate Partner Violence: Not on file    Family History  Problem Relation Age of Onset   Lung cancer Sister    Osteoporosis Mother    Heart disease Father    Diabetes Paternal Uncle    Ovarian cancer Maternal Grandmother    Breast cancer Sister 63   Colon cancer Neg Hx    Stomach cancer Neg Hx    Esophageal cancer Neg Hx    Rectal cancer Neg Hx     Review of Systems:  As stated in the HPI and otherwise negative.   BP 118/70   Pulse 87   Ht '5\' 5"'$  (1.651 m)   Wt 167 lb (75.8 kg)   LMP 04/02/1988   SpO2 97%   BMI 27.79 kg/m   Physical Examination: General: Well developed, well nourished, NAD  HEENT: OP clear, mucus membranes moist  SKIN: warm, dry. No rashes. Neuro: No focal deficits  Musculoskeletal: Muscle strength 5/5 all ext  Psychiatric: Mood and affect normal  Neck: No JVD, no carotid bruits, no thyromegaly, no lymphadenopathy.  Lungs:Clear bilaterally, no wheezes, rhonci, crackles Cardiovascular: Irregular irregular. No murmurs, gallops or rubs. Abdomen:Soft. Bowel sounds present. Non-tender.  Extremities: No lower extremity edema. Pulses are 2 + in the bilateral  DP/PT.  EKG:  EKG is ordered today. The ekg ordered today demonstrates Atrial fib/flutter rate 87 bpm  Recent Labs: No results found for requested labs within last 8760 hours.   Lipid Panel    Component Value Date/Time   CHOL 144 09/13/2015 0734   TRIG 87 09/13/2015 0734   TRIG 88 02/06/2006 1028   HDL 59 09/13/2015 0734  CHOLHDL 2.4 09/13/2015 0734   VLDL 17 09/13/2015 0734   LDLCALC 68 09/13/2015 0734   LDLDIRECT 150.2 11/28/2010 1131     Wt Readings from Last 3 Encounters:  11/02/20 167 lb (75.8 kg)  01/27/20 164 lb 3.2 oz (74.5 kg)  12/17/19 160 lb (72.6 kg)      Assessment and Plan:   1. New onset atrial fibrillation: Her heart rate is within a normal range on no AV nodal blockers. CHADS VASC score 4. Stroke risk was 4.8% per year in >90,000 patients (the [place] Atrial Fibrillation Cohort Study) and 6.7% risk of stroke/TIA/systemic embolism. . I have reviewed  atrial fibrillation and risk of stroke. She agrees to start anti-coagulation therapy following her dental extraction tomorrow. I will plan Eliquis 5 mg po BID. BMET and CBC today. 3 day Zio cardiac monitor.   Current medicines are reviewed at length with the patient today.  The patient does not have concerns regarding medicines.  The following changes have been made:  no change  Labs/ tests ordered today include:   Orders Placed This Encounter  Procedures   CBC   Basic metabolic panel   LONG TERM MONITOR (3-14 DAYS)   EKG 12-Lead      Disposition:   F/U with Dr. Burt Knack as planned.    Signed, Lauree Chandler, MD 11/02/2020 3:18 PM    [facility] [address], [place], [place]  [address] Phone: [phone]; Fax: [phone]

## 2020-11-02 NOTE — Patient Instructions (Addendum)
Medication Instructions:  Your physician has recommended you make the following change in your medication:  1.) start Eliquis 5 mg - one tablet twice daily  PLEASE WAIT TO START THIS UNTIL AFTER YOUR TOOTH EXTRACTION AND WHEN DENTIST SAYS IT IS SAFE TO BEGIN IT.   2.) stop aspirin  *If you need a refill on your cardiac medications before your next appointment, please call your pharmacy*   Lab Work: Today: BMET, CBC  If you have labs (blood work) drawn today and your tests are completely normal, you will receive your results only by: [place] (if you have MyChart) OR A paper copy in the mail If you have any lab test that is abnormal or we need to change your treatment, we will call you to review the results.   Testing/Procedures: none   Follow-Up: To be determined based on results.   Other Instructions

## 2020-11-02 NOTE — Progress Notes (Signed)
Received call from patient.  Her apple watch has been alarming that she is in atrial fibrillation.  She does not have any known history of atrial fibrillation.  The patient has no symptoms.  However, she is concerned because she has a dental procedure scheduled tomorrow with an oral Psychologist, sport and exercise.  I think it is important to assess her heart rhythm.  We will request a nurse visit this afternoon for an EKG.  As long as her EKG looks okay and is in sinus rhythm, she can proceed with her dental extraction tomorrow.  I would like her to have a 3-day ZIO monitor arranged as well.  Thank you  [name] 11/02/2020 12:11 PM

## 2020-11-02 NOTE — Progress Notes (Signed)
Pt scheduled for nurse visit EKG this afternoon at 2pm as MD requested. Pt agreeable. Will inform pt of monitor need at visit this afternoon.

## 2020-11-03 LAB — BASIC METABOLIC PANEL
BUN/Creatinine Ratio: 18 (ref 12–28)
BUN: 18 mg/dL (ref 8–27)
CO2: 23 mmol/L (ref 20–29)
Calcium: 9.3 mg/dL (ref 8.7–10.3)
Chloride: 97 mmol/L (ref 96–106)
Creatinine, Ser: 1.02 mg/dL — ABNORMAL HIGH (ref 0.57–1.00)
Glucose: 103 mg/dL — ABNORMAL HIGH (ref 65–99)
Potassium: 3.7 mmol/L (ref 3.5–5.2)
Sodium: 136 mmol/L (ref 134–144)
eGFR: 56 mL/min/{1.73_m2} — ABNORMAL LOW (ref >59)

## 2020-11-03 LAB — CBC
Hematocrit: 49.3 % — ABNORMAL HIGH (ref 34.0–46.6)
Hemoglobin: 16.5 g/dL — ABNORMAL HIGH (ref 11.1–15.9)
MCH: 27.9 pg (ref 26.6–33.0)
MCHC: 33.5 g/dL (ref 31.5–35.7)
MCV: 83 fL (ref 79–97)
Platelets: 284 10*3/uL (ref 150–450)
RBC: 5.92 x10E6/uL — ABNORMAL HIGH (ref 3.77–5.28)
RDW: 14 % (ref 11.7–15.4)
WBC: 9.9 10*3/uL (ref 3.4–10.8)

## 2020-11-05 DIAGNOSIS — I4891 Unspecified atrial fibrillation: Secondary | ICD-10-CM | POA: Diagnosis not present

## 2020-11-05 DIAGNOSIS — R9431 Abnormal electrocardiogram [ECG] [EKG]: Secondary | ICD-10-CM

## 2020-11-11 DIAGNOSIS — R9431 Abnormal electrocardiogram [ECG] [EKG]: Secondary | ICD-10-CM | POA: Diagnosis not present

## 2020-11-11 DIAGNOSIS — I4891 Unspecified atrial fibrillation: Secondary | ICD-10-CM | POA: Diagnosis not present

## 2020-11-15 DIAGNOSIS — E119 Type 2 diabetes mellitus without complications: Secondary | ICD-10-CM | POA: Diagnosis not present

## 2020-11-15 DIAGNOSIS — E538 Deficiency of other specified B group vitamins: Secondary | ICD-10-CM | POA: Diagnosis not present

## 2020-11-15 DIAGNOSIS — E559 Vitamin D deficiency, unspecified: Secondary | ICD-10-CM | POA: Diagnosis not present

## 2020-11-15 DIAGNOSIS — R0989 Other specified symptoms and signs involving the circulatory and respiratory systems: Secondary | ICD-10-CM | POA: Diagnosis not present

## 2020-11-15 DIAGNOSIS — Z1231 Encounter for screening mammogram for malignant neoplasm of breast: Secondary | ICD-10-CM | POA: Diagnosis not present

## 2020-11-23 ENCOUNTER — Telehealth: Payer: Self-pay

## 2020-11-23 DIAGNOSIS — I4891 Unspecified atrial fibrillation: Secondary | ICD-10-CM

## 2020-11-23 MED ORDER — METOPROLOL SUCCINATE ER 25 MG PO TB24: 25.0000 mg | ORAL_TABLET | Freq: Every day | ORAL | 3 refills | Status: DC

## 2020-11-23 NOTE — Telephone Encounter (Signed)
-----   Message from [name], MD sent at 11/23/2020  4:16 PM EDT ----- Reviewed monitor findings with patient.  She has noticed some exercise intolerance with shortness of breath when riding the stationary bike.  Otherwise she is doing well and tolerating apixaban without bleeding problems.  I have recommended starting metoprolol succinate 25 mg daily.  Please refer the patient to Dr. Quentin Ore for further management of atrial fibrillation to consider ablation, antiarrhythmic drug therapy, or continued rate control in this patient with a high atrial fibrillation burden.

## 2020-11-23 NOTE — Telephone Encounter (Addendum)
Referral to EP and new Rx order placed.

## 2020-11-29 ENCOUNTER — Other Ambulatory Visit: Payer: Self-pay

## 2020-11-29 ENCOUNTER — Encounter: Payer: Self-pay | Admitting: *Deleted

## 2020-11-29 ENCOUNTER — Encounter: Payer: Self-pay | Admitting: Cardiology

## 2020-11-29 ENCOUNTER — Ambulatory Visit (INDEPENDENT_AMBULATORY_CARE_PROVIDER_SITE_OTHER): Payer: Medicare Other | Admitting: Cardiology

## 2020-11-29 VITALS — BP 118/74 | HR 86 | Ht 65.0 in | Wt 167.0 lb

## 2020-11-29 DIAGNOSIS — I10 Essential (primary) hypertension: Secondary | ICD-10-CM

## 2020-11-29 DIAGNOSIS — I4819 Other persistent atrial fibrillation: Secondary | ICD-10-CM | POA: Diagnosis not present

## 2020-11-29 MED ORDER — METOPROLOL SUCCINATE ER 25 MG PO TB24: 12.5000 mg | ORAL_TABLET | Freq: Every day | ORAL | 3 refills | Status: DC

## 2020-11-29 MED ORDER — FLECAINIDE ACETATE 50 MG PO TABS: 75.0000 mg | ORAL_TABLET | Freq: Two times a day (BID) | ORAL | 3 refills | Status: DC

## 2020-11-29 NOTE — Patient Instructions (Addendum)
Medication Instructions:  Start Flecainide 75 mg two time a day Reduce Metoprolol succinate to 12.5 mg daily Your physician recommends that you continue on your current medications as directed. Please refer to the Current Medication list given to you today.  Labwork: None ordered.  Testing/Procedures: Your physician has requested that you have a lexiscan myoview. For further information please visit [email or web address]. Please follow instruction sheet, as given.   Follow-Up: 12/07/20 for a nurse visit at 45 am Your physician wants you to follow-up in: 4 weeks with  Lars Mage, MD    Any Other Special Instructions Will Be Listed Below (If Applicable).  If you need a refill on your cardiac medications before your next appointment, please call your pharmacy.

## 2020-11-29 NOTE — Progress Notes (Signed)
Electrophysiology Office Note:    Date:  11/29/2020   ID:  Brittney Fox, DOB Jun 15, 1940, MRN [id number]  PCP:  Velna Hatchet, MD  [facility] Cardiologist:  [name], MD  [facility] Electrophysiologist:  Vickie Epley, MD   Referring MD: [name], MD   Chief Complaint: Atrial fibrillation  History of Present Illness:    Brittney Fox is a 80 y.o. female who presents for an evaluation of atrial fibrillation at the request of Dr. Burt Knack. Their medical history includes diabetes, GERD, hyperlipidemia, Raynaud's and spinal stenosis.  The patient was recently seen by Dr. Angelena Form on November 02, 2020.  This visit was prompted by her apple watch alerting her to probable atrial fibrillation.  EKG in the office confirmed atrial fibrillation.  She was subsequently started on Eliquis for stroke prophylaxis and has been taking it uninterrupted since that time.  She is tolerated this medication well.  Initially during her visit she told me that she was asymptomatic with atrial fibrillation but upon further questioning she tells me that she has noticed a slight decrease in her exercise tolerance since then being diagnosed with atrial fibrillation.  She notices it during walks with her dog.  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  She tells me that she has had a stress test in the remote past which did not show evidence of ischemic heart disease.  She has no ischemic symptoms today.  No syncope or presyncope.  Past Medical History:  Diagnosis Date   Anal fissure    as a child   Blood transfusion without reported diagnosis    Diabetes mellitus    managed by diet and exercise   Diverticulosis of colon (without mention of hemorrhage)    Fatty liver    GERD (gastroesophageal reflux disease)    Heart murmur    Hyperlipidemia    Irritable bowel syndrome    Lymphocytic colitis    chronic   Neuromuscular disorder ([place])    neuropathy    Osteoarthritis    Osteopenia 09/2007   Other mucopurulent conjunctivitis    Premature ventricular contractions    Raynaud's syndrome    Rosacea    opitcal   Spinal stenosis     Past Surgical History:  Procedure Laterality Date   CATARACT EXTRACTION Bilateral    CHOLECYSTECTOMY     COLONOSCOPY     dental implants     [place] Bilateral 1973   Jaw Implants  2007   2018.2019   KNEE ARTHROSCOPY Right    lens implant     ovarian wedge resection  1964   REPLACEMENT TOTAL KNEE Bilateral 2005, 2007   TOTAL ABDOMINAL HYSTERECTOMY W/ BILATERAL SALPINGOOPHORECTOMY  1990   Excessive Bleeding    TOTAL KNEE ARTHROPLASTY Left 08/2003   TOTAL KNEE ARTHROPLASTY Right 08/2005    Current Medications: Current Meds  Medication Sig   apixaban (ELIQUIS) 5 MG TABS tablet Take 1 tablet (5 mg total) by mouth 2 (two) times daily.   Cholecalciferol (VITAMIN D) 50 MCG (2000 UT) tablet Take 2,000 Units by mouth daily.   dicyclomine (BENTYL) 20 MG tablet TAKE ONE TABLET EVERY 6 HOURS AS NEEDED   diphenoxylate-atropine (LOMOTIL) 2.5-0.025 MG per tablet TAKE ONE (1) TABLET FOUR TIMES EACH DAY AS NEEDED FOR DIARRHEA   estradiol (ESTRACE) 0.5 MG tablet Take 1/2 tab every other day  FIBER PO Take by mouth.   flecainide (TAMBOCOR) 50 MG tablet Take 1.5 tablets (75 mg total) by mouth 2 (two) times daily.   JARDIANCE 10 MG TABS tablet Take 1 tablet by mouth daily.   metoprolol succinate (TOPROL XL) 25 MG 24 hr tablet Take 0.5 tablets (12.5 mg total) by mouth daily.   naproxen sodium (ALEVE) 220 MG tablet Take 220 mg by mouth as directed. PRN   omeprazole (PRILOSEC) 40 MG capsule TAKE ONE CAPSULE EACH DAY   potassium chloride (KLOR-CON) 10 MEQ tablet Take 2 tablets (20 mEq total) by mouth daily.   Probiotic Product (ALIGN PO) Take by mouth.   Psyllium (METAMUCIL PO) Take by mouth daily.   Pyridoxine HCl (B-6 PO) Take by mouth.    spironolactone-hydrochlorothiazide (ALDACTAZIDE) 25-25 MG per tablet TAKE ONE TABLET EACH DAY   terconazole (TERAZOL 7) 0.4 % vaginal cream Apply externally twice daily for 7 days.   vitamin B-12 (CYANOCOBALAMIN) 1000 MCG tablet Take 1,000 mcg by mouth daily.   [DISCONTINUED] gabapentin (NEURONTIN) 100 MG capsule Take by mouth.   [DISCONTINUED] metoprolol succinate (TOPROL XL) 25 MG 24 hr tablet Take 1 tablet (25 mg total) by mouth daily.     Allergies:   Amoxicillin, Codeine phosphate, and Morphine sulfate   Social History   Socioeconomic History   Marital status: Widowed    Spouse name: Not on file   Number of children: 2   Years of education: Not on file   Highest education level: Not on file  Occupational History   Occupation: REAL ESTATE    Employer: [employer]  Tobacco Use   Smoking status: Former    Types: Cigarettes    Quit date: 10/22/1961    Years since quitting: 59.1   Smokeless tobacco: Never  Vaping Use   Vaping Use: Never used  Substance and Sexual Activity   Alcohol use: No    Alcohol/week: 0.0 standard drinks   Drug use: No   Sexual activity: Not Currently    Partners: Male    Birth control/protection: Surgical    Comment: hysterectomy  Other Topics Concern   Not on file  Social History Narrative   Not on file   Social Determinants of Health   Financial Resource Strain: Not on file  Food Insecurity: Not on file  Transportation Needs: Not on file  Physical Activity: Not on file  Stress: Not on file  Social Connections: Not on file     Family History: The patient's family history includes Breast cancer (age of onset: 52) in her sister; Diabetes in her paternal uncle; Heart disease in her father; Lung cancer in her sister; Osteoporosis in her mother; Ovarian cancer in her maternal grandmother. There is no history of Colon cancer, Stomach cancer, Esophageal cancer, or Rectal cancer.  ROS:   Please see the history of present  illness.    All other systems reviewed and are negative.  EKGs/Labs/Other Studies Reviewed:    The following studies were reviewed today: Prior records  EKG:  The ekg ordered today demonstrates coarse atrial fibrillation/flutter.  Ventricular rate of 86 bpm.  QRS duration is 96 ms.  QTc is 460 ms.  Recent Labs: 11/02/2020: BUN 18; Creatinine, Ser 1.02; Hemoglobin 16.5; Platelets 284; Potassium 3.7; Sodium 136  Recent Lipid Panel    Component Value Date/Time   CHOL 144 09/13/2015 0734   TRIG 87 09/13/2015 0734   TRIG 88 02/06/2006 1028   HDL 59 09/13/2015 0734   CHOLHDL  2.4 09/13/2015 0734   VLDL 17 09/13/2015 0734   LDLCALC 68 09/13/2015 0734   LDLDIRECT 150.2 11/28/2010 1131    Physical Exam:    VS:  BP 118/74   Pulse 86   Ht '5\' 5"'$  (1.651 m)   Wt 167 lb (75.8 kg)   LMP 04/02/1988   BMI 27.79 kg/m     Wt Readings from Last 3 Encounters:  11/29/20 167 lb (75.8 kg)  11/02/20 167 lb (75.8 kg)  01/27/20 164 lb 3.2 oz (74.5 kg)     GEN:  Well nourished, well developed in no acute distress.  Appears younger than stated age 53: Normal NECK: No JVD; No carotid bruits LYMPHATICS: No lymphadenopathy CARDIAC: Irregularly irregular , no murmurs, rubs, gallops RESPIRATORY:  Clear to auscultation without rales, wheezing or rhonchi  ABDOMEN: Soft, non-tender, non-distended MUSCULOSKELETAL:  No edema; No deformity  SKIN: Warm and dry NEUROLOGIC:  Alert and oriented x 3 PSYCHIATRIC:  Normal affect   ASSESSMENT:    1. Persistent atrial fibrillation ([place])   2. Essential hypertension    PLAN:    In order of problems listed above:  1. Persistent atrial fibrillation ([place]) Minimally symptomatic with slight decrease in exercise tolerance.  Ventricular rates are controlled at 86 on today's EKG.  She is on Eliquis for stroke prophylaxis.  During today's visit I discussed the treatment options available for her atrial fibrillation including continued conservative management with  rate control and stroke prophylaxis using Eliquis.  I discussed rhythm control strategies using antiarrhythmic drug therapy and ablation.  Given her mildly reduced exercise tolerance and I would favor a rhythm control strategy.  We will plan to start with flecainide.  If she fails flecainide I would consider ablation for her.  I do think she is a good candidate for ablation if she ultimately wishes to pursue this.  It has been several years since her last ischemic evaluation.  I will order a Lexiscan to evaluate for any evidence of coronary artery disease.  I will go ahead and start 75 mg twice daily of flecainide.  She has been anticoagulated for greater than 3 weeks uninterrupted.  She will continue to take metoprolol succinate 12.5 mg once a day.  We will bring her back for a nurse visit/EKG in 7 to 10 days while taking flecainide.  I will plan to see her back in 3 to 4 weeks to see how she is doing on this therapy.  If she remains in atrial fibrillation at the time of that appointment, would plan for cardioversion.  During today's appointment I did discuss ablation therapy in detail with the patient including risk, recovery, efficacy.  We discussed how this will be a backup plan for rhythm control if she fails flecainide therapy.  2. Essential hypertension Controlled.  Continue metoprolol as above.  Continue spironolactone and hydrochlorothiazide.  Follow-up 3 to 4 weeks.     Total time spent with patient today 65 minutes. This includes reviewing records, evaluating the patient and coordinating care.  Medication Adjustments/Labs and Tests Ordered: Current medicines are reviewed at length with the patient today.  Concerns regarding medicines are outlined above.  Orders Placed This Encounter  Procedures   Cardiac Stress Test: Informed Consent Details: Physician/Practitioner Attestation; Transcribe to consent form and obtain patient signature   MYOCARDIAL PERFUSION IMAGING   EKG 12-Lead    Meds ordered this encounter  Medications   flecainide (TAMBOCOR) 50 MG tablet    Sig: Take 1.5 tablets (75 mg total)  by mouth 2 (two) times daily.    Dispense:  270 tablet    Refill:  3   metoprolol succinate (TOPROL XL) 25 MG 24 hr tablet    Sig: Take 0.5 tablets (12.5 mg total) by mouth daily.    Dispense:  45 tablet    Refill:  3     Signed, Benn Tarver T. Quentin Ore, MD, [facility], [facility] 11/29/2020 10:34 PM    Electrophysiology [facility]

## 2020-11-30 ENCOUNTER — Telehealth (HOSPITAL_COMMUNITY): Payer: Self-pay | Admitting: *Deleted

## 2020-11-30 DIAGNOSIS — E559 Vitamin D deficiency, unspecified: Secondary | ICD-10-CM | POA: Diagnosis not present

## 2020-11-30 DIAGNOSIS — E1169 Type 2 diabetes mellitus with other specified complication: Secondary | ICD-10-CM | POA: Diagnosis not present

## 2020-11-30 DIAGNOSIS — E785 Hyperlipidemia, unspecified: Secondary | ICD-10-CM | POA: Diagnosis not present

## 2020-11-30 NOTE — Telephone Encounter (Signed)
Patient given detailed instructions per Myocardial Perfusion Study Information Sheet for the test on 12/07/20 at 0730. Patient notified to arrive 15 minutes early and that it is imperative to arrive on time for appointment to keep from having the test rescheduled.  If you need to cancel or reschedule your appointment, please call the office within 24 hours of your appointment. . Patient verbalized understanding.Brittney Fox, [name] Pt has instructions and did not need letter.

## 2020-12-06 DIAGNOSIS — R5383 Other fatigue: Secondary | ICD-10-CM | POA: Diagnosis not present

## 2020-12-06 DIAGNOSIS — G4700 Insomnia, unspecified: Secondary | ICD-10-CM | POA: Diagnosis not present

## 2020-12-06 DIAGNOSIS — E538 Deficiency of other specified B group vitamins: Secondary | ICD-10-CM | POA: Diagnosis not present

## 2020-12-06 DIAGNOSIS — I10 Essential (primary) hypertension: Secondary | ICD-10-CM | POA: Diagnosis not present

## 2020-12-06 DIAGNOSIS — D6869 Other thrombophilia: Secondary | ICD-10-CM | POA: Diagnosis not present

## 2020-12-06 DIAGNOSIS — Z1331 Encounter for screening for depression: Secondary | ICD-10-CM | POA: Diagnosis not present

## 2020-12-06 DIAGNOSIS — M8580 Other specified disorders of bone density and structure, unspecified site: Secondary | ICD-10-CM | POA: Diagnosis not present

## 2020-12-06 DIAGNOSIS — G629 Polyneuropathy, unspecified: Secondary | ICD-10-CM | POA: Diagnosis not present

## 2020-12-06 DIAGNOSIS — I4891 Unspecified atrial fibrillation: Secondary | ICD-10-CM | POA: Diagnosis not present

## 2020-12-06 DIAGNOSIS — Z0000 Encounter for general adult medical examination without abnormal findings: Secondary | ICD-10-CM | POA: Diagnosis not present

## 2020-12-06 DIAGNOSIS — Z1339 Encounter for screening examination for other mental health and behavioral disorders: Secondary | ICD-10-CM | POA: Diagnosis not present

## 2020-12-06 DIAGNOSIS — E785 Hyperlipidemia, unspecified: Secondary | ICD-10-CM | POA: Diagnosis not present

## 2020-12-06 DIAGNOSIS — E1169 Type 2 diabetes mellitus with other specified complication: Secondary | ICD-10-CM | POA: Diagnosis not present

## 2020-12-06 DIAGNOSIS — D692 Other nonthrombocytopenic purpura: Secondary | ICD-10-CM | POA: Diagnosis not present

## 2020-12-07 ENCOUNTER — Other Ambulatory Visit: Payer: Self-pay

## 2020-12-07 ENCOUNTER — Ambulatory Visit (HOSPITAL_COMMUNITY): Payer: Medicare Other | Attending: Cardiovascular Disease

## 2020-12-07 ENCOUNTER — Telehealth: Payer: Self-pay

## 2020-12-07 ENCOUNTER — Ambulatory Visit: Payer: Medicare Other

## 2020-12-07 DIAGNOSIS — I4819 Other persistent atrial fibrillation: Secondary | ICD-10-CM

## 2020-12-07 LAB — MYOCARDIAL PERFUSION IMAGING
Base ST Depression (mm): 0 mm
LV dias vol: 95 mL (ref 46–106)
LV sys vol: 43 mL
Nuc Stress EF: 55 %
Peak HR: 63 {beats}/min
Rest HR: 57 {beats}/min
Rest Nuclear Isotope Dose: 11 mCi
SDS: 1
SRS: 0
SSS: 1
Stress Nuclear Isotope Dose: 32.6 mCi
TID: 1.08

## 2020-12-07 MED ORDER — TECHNETIUM TC 99M TETROFOSMIN IV KIT
11.0000 | PACK | Freq: Once | INTRAVENOUS | Status: AC | PRN
  Administered 2020-12-07: 11 via INTRAVENOUS
  Filled 2020-12-07: qty 11

## 2020-12-07 MED ORDER — REGADENOSON 0.4 MG/5ML IV SOLN
0.4000 mg | Freq: Once | INTRAVENOUS | Status: AC
  Administered 2020-12-07: 0.4 mg via INTRAVENOUS

## 2020-12-07 MED ORDER — TECHNETIUM TC 99M TETROFOSMIN IV KIT
32.6000 | PACK | Freq: Once | INTRAVENOUS | Status: AC | PRN
  Administered 2020-12-07: 32.6 via INTRAVENOUS
  Filled 2020-12-07: qty 33

## 2020-12-07 NOTE — Telephone Encounter (Signed)
Spoke with Pt upon arrival for stress test.  Since starting the flecainide she is having:  shortness of breath, fatigue, headaches and bloating.  Advised would discuss with Dr. Quentin Ore.

## 2020-12-09 ENCOUNTER — Ambulatory Visit: Payer: Medicare Other

## 2020-12-13 ENCOUNTER — Telehealth: Payer: Self-pay | Admitting: Cardiology

## 2020-12-13 NOTE — Telephone Encounter (Signed)
Attempted phone call to pt and left message for pt to contact triage at [phone].

## 2020-12-13 NOTE — Telephone Encounter (Signed)
Pt called in to report side effects since starting flecainide.  She reports SOB and feeling sluggish.  Per pt she is normally a high energy person, still works and now she gets winded with very little activity.  Per her apple watch her HR has been as low as 44 but mostly in the mid 50's.  Last night watch showed NSR.  She also notes that her ankles are now swollen she wears compression socks and when she wakes up legs are still swollen.  Pt wants to know what she should do, she can't keep going feeling this way.  Will route to MD and pharm d to advise.

## 2020-12-13 NOTE — Telephone Encounter (Signed)
   Pt c/o medication issue:  1. Name of Medication:   flecainide (TAMBOCOR) 50 MG tablet    2. How are you currently taking this medication (dosage and times per day)? Take 1.5 tablets (75 mg total) by mouth 2 (two) times daily.  3. Are you having a reaction (difficulty breathing--STAT)?   4. What is your medication issue? Pt said this meds making her more SOB, she also wants to know her stress test result

## 2020-12-13 NOTE — Telephone Encounter (Signed)
Pt returning call  Best number [phone]

## 2020-12-15 NOTE — Telephone Encounter (Signed)
Sounds like patient is intolerant to flecainide.  Would suggest discontinuing to see if symptoms resolve but I will defer to Dr Quentin Ore

## 2020-12-16 NOTE — Telephone Encounter (Signed)
Gave patient recommendation and removed Flecainide from med list.  Verbalized understanding.

## 2020-12-21 NOTE — Telephone Encounter (Signed)
See phone note dated 12/07/2020.  Pt advised to stop flecainide.  F/u appt scheduled.

## 2020-12-22 ENCOUNTER — Ambulatory Visit (INDEPENDENT_AMBULATORY_CARE_PROVIDER_SITE_OTHER): Payer: Medicare Other | Admitting: Cardiology

## 2020-12-22 ENCOUNTER — Encounter: Payer: Self-pay | Admitting: Cardiology

## 2020-12-22 ENCOUNTER — Other Ambulatory Visit: Payer: Self-pay

## 2020-12-22 VITALS — BP 132/80 | HR 60 | Ht 65.0 in | Wt 164.6 lb

## 2020-12-22 DIAGNOSIS — I480 Paroxysmal atrial fibrillation: Secondary | ICD-10-CM

## 2020-12-22 DIAGNOSIS — I10 Essential (primary) hypertension: Secondary | ICD-10-CM | POA: Diagnosis not present

## 2020-12-22 NOTE — Progress Notes (Signed)
Electrophysiology Office Follow up Visit Note:    Date:  12/22/2020   ID:  Brittney Fox, DOB 1940/04/23, MRN [id number]  PCP:  Velna Hatchet, MD  [facility] Cardiologist:  [name], MD  [facility] Electrophysiologist:  Vickie Epley, MD    Interval History:    Brittney Fox is a 80 y.o. female who presents for a follow up visit. They were last seen in clinic November 29, 2020 for her paroxysmal atrial fibrillation.  At that appointment we started flecainide.  She was also on metoprolol succinate 12.5 mg once a day.  She did not tolerate this medication.  She tells me that she started to feel tired and short of breath when she was taking flecainide.  The symptoms have stopped now that she has stopped taking flecainide.  She continues to take Toprol-XL 12.5 mg by mouth once daily.  She is taking her Eliquis 5 mg by mouth twice daily without bleeding issues.  She thinks she may have had 1 or 2 episodes of A. fib that were short-lived.  She has been very active since I last saw her with traveling.  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   Past Medical History:  Diagnosis Date   Anal fissure    as a child   Blood transfusion without reported diagnosis    Diabetes mellitus    managed by diet and exercise   Diverticulosis of colon (without mention of hemorrhage)    Fatty liver    GERD (gastroesophageal reflux disease)    Heart murmur    Hyperlipidemia    Irritable bowel syndrome    Lymphocytic colitis    chronic   Neuromuscular disorder (HCC)    neuropathy   Osteoarthritis    Osteopenia 09/2007   Other mucopurulent conjunctivitis    Premature ventricular contractions    Raynaud's syndrome    Rosacea    opitcal   Spinal stenosis     Past Surgical History:  Procedure Laterality Date   CATARACT EXTRACTION Bilateral    CHOLECYSTECTOMY     COLONOSCOPY     dental implants     DILATION AND CURETTAGE OF UTERUS  1990   FOOT  SURGERY     HIP SURGERY Bilateral 1973   Jaw Implants  2007   2018.2019   KNEE ARTHROSCOPY Right    lens implant     ovarian wedge resection  1964   REPLACEMENT TOTAL KNEE Bilateral 2005, 2007   TOTAL ABDOMINAL HYSTERECTOMY W/ BILATERAL SALPINGOOPHORECTOMY  1990   Excessive Bleeding    TOTAL KNEE ARTHROPLASTY Left 08/2003   TOTAL KNEE ARTHROPLASTY Right 08/2005    Current Medications: Current Meds  Medication Sig   apixaban (ELIQUIS) 5 MG TABS tablet Take 1 tablet (5 mg total) by mouth 2 (two) times daily.   Cholecalciferol (VITAMIN D) 50 MCG (2000 UT) tablet Take 2,000 Units by mouth daily.   Cyanocobalamin (B-12 IJ) Inject as directed every 30 (thirty) days.   dicyclomine (BENTYL) 20 MG tablet TAKE ONE TABLET EVERY 6 HOURS AS NEEDED   diphenoxylate-atropine (LOMOTIL) 2.5-0.025 MG per tablet TAKE ONE (1) TABLET FOUR TIMES EACH DAY AS NEEDED FOR DIARRHEA   estradiol (ESTRACE) 0.5 MG tablet Take 1/2 tab every other day   FIBER PO Take by mouth.   JARDIANCE 10 MG TABS tablet Take 1 tablet by mouth daily.   metoprolol succinate (TOPROL XL) 25 MG 24 hr tablet Take 0.5 tablets (12.5 mg  total) by mouth daily.   naproxen sodium (ALEVE) 220 MG tablet Take 220 mg by mouth as directed. PRN   omeprazole (PRILOSEC) 40 MG capsule TAKE ONE CAPSULE EACH DAY   potassium chloride (KLOR-CON) 10 MEQ tablet Take 2 tablets (20 mEq total) by mouth daily.   Probiotic Product (ALIGN PO) Take by mouth.   Psyllium (METAMUCIL PO) Take by mouth daily.   Pyridoxine HCl (B-6 PO) Take by mouth.   spironolactone-hydrochlorothiazide (ALDACTAZIDE) 25-25 MG per tablet TAKE ONE TABLET EACH DAY   terconazole (TERAZOL 7) 0.4 % vaginal cream Apply externally twice daily for 7 days.     Allergies:   Amoxicillin, Codeine phosphate, and Morphine sulfate   Social History   Socioeconomic History   Marital status: Widowed    Spouse name: Not on file   Number of children: 2   Years of education: Not on file   Highest  education level: Not on file  Occupational History   Occupation: REAL ESTATE    Employer: [employer]  Tobacco Use   Smoking status: Former    Types: Cigarettes    Quit date: 10/22/1961    Years since quitting: 59.2   Smokeless tobacco: Never  Vaping Use   Vaping Use: Never used  Substance and Sexual Activity   Alcohol use: No    Alcohol/week: 0.0 standard drinks   Drug use: No   Sexual activity: Not Currently    Partners: Male    Birth control/protection: Surgical    Comment: hysterectomy  Other Topics Concern   Not on file  Social History Narrative   Not on file   Social Determinants of Health   Financial Resource Strain: Not on file  Food Insecurity: Not on file  Transportation Needs: Not on file  Physical Activity: Not on file  Stress: Not on file  Social Connections: Not on file     Family History: The patient's family history includes Breast cancer (age of onset: 49) in her sister; Diabetes in her paternal uncle; Heart disease in her father; Lung cancer in her sister; Osteoporosis in her mother; Ovarian cancer in her maternal grandmother. There is no history of Colon cancer, Stomach cancer, Esophageal cancer, or Rectal cancer.  ROS:   Please see the history of present illness.    All other systems reviewed and are negative.  EKGs/Labs/Other Studies Reviewed:    The following studies were reviewed today:   EKG:  The ekg ordered today demonstrates normal sinus rhythm.  Recent Labs: 11/02/2020: BUN 18; Creatinine, Ser 1.02; Hemoglobin 16.5; Platelets 284; Potassium 3.7; Sodium 136  Recent Lipid Panel    Component Value Date/Time   CHOL 144 09/13/2015 0734   TRIG 87 09/13/2015 0734   TRIG 88 02/06/2006 1028   HDL 59 09/13/2015 0734   CHOLHDL 2.4 09/13/2015 0734   VLDL 17 09/13/2015 0734   LDLCALC 68 09/13/2015 0734   LDLDIRECT 150.2 11/28/2010 1131    Physical Exam:    VS:  BP 132/80   Pulse 60   Ht 5\' 5"  (1.651 m)   Wt 164 lb  9.6 oz (74.7 kg)   LMP 04/02/1988   SpO2 96%   BMI 27.39 kg/m     Wt Readings from Last 3 Encounters:  12/22/20 164 lb 9.6 oz (74.7 kg)  12/07/20 167 lb (75.8 kg)  11/29/20 167 lb (75.8 kg)     GEN:  Well nourished, well developed in no acute distress HEENT: Normal NECK: No JVD; No carotid  bruits LYMPHATICS: No lymphadenopathy CARDIAC: RRR, no murmurs, rubs, gallops RESPIRATORY:  Clear to auscultation without rales, wheezing or rhonchi  ABDOMEN: Soft, non-tender, non-distended MUSCULOSKELETAL:  No edema; No deformity  SKIN: Warm and dry NEUROLOGIC:  Alert and oriented x 3 PSYCHIATRIC:  Normal affect   ASSESSMENT:    1. Essential hypertension   2. Paroxysmal atrial fibrillation (HCC)    PLAN:    In order of problems listed above:   #Paroxysmal atrial fibrillation Low burden overall.  Minimally symptomatic.  For now, we will continue conservative therapy with Eliquis and low-dose metoprolol.  She will reach out to [place] if her episodes of atrial fibrillation become more frequent or have more significant symptoms.  We have discussed alternative antiarrhythmic drug strategies versus ablation.  #Hypertension Controlled Continue current regimen.  Follow-up with me in 6 months or sooner as needed.    Medication Adjustments/Labs and Tests Ordered: Current medicines are reviewed at length with the patient today.  Concerns regarding medicines are outlined above.  Orders Placed This Encounter  Procedures   EKG 12-Lead    No orders of the defined types were placed in this encounter.    Signed, Lars Mage, MD, [facility], [facility] 12/22/2020 11:04 AM    Electrophysiology [facility]

## 2020-12-22 NOTE — Patient Instructions (Addendum)
Medication Instructions:  Your physician recommends that you continue on your current medications as directed. Please refer to the Current Medication list given to you today. *If you need a refill on your cardiac medications before your next appointment, please call your pharmacy*  Lab Work: None ordered. If you have labs (blood work) drawn today and your tests are completely normal, you will receive your results only by: [place] (if you have MyChart) OR A paper copy in the mail If you have any lab test that is abnormal or we need to change your treatment, we will call you to review the results.  Testing/Procedures: None ordered.  Follow-Up: At [facility], you and your health needs are our priority.  As part of our continuing mission to provide you with exceptional heart care, we have created designated Provider Care Teams.  These Care Teams include your primary Cardiologist (physician) and Advanced Practice Providers (APPs -  Physician Assistants and Nurse Practitioners) who all work together to provide you with the care you need, when you need it.  Your next appointment:   Your physician wants you to follow-up in: 6 months with Vickie Epley, MD

## 2020-12-26 ENCOUNTER — Other Ambulatory Visit: Payer: Self-pay | Admitting: Obstetrics & Gynecology

## 2020-12-27 ENCOUNTER — Ambulatory Visit (HOSPITAL_BASED_OUTPATIENT_CLINIC_OR_DEPARTMENT_OTHER): Payer: Medicare Other | Admitting: Obstetrics & Gynecology

## 2020-12-28 ENCOUNTER — Ambulatory Visit (INDEPENDENT_AMBULATORY_CARE_PROVIDER_SITE_OTHER): Payer: Medicare Other | Admitting: Obstetrics & Gynecology

## 2020-12-28 ENCOUNTER — Other Ambulatory Visit: Payer: Self-pay

## 2020-12-28 ENCOUNTER — Encounter (HOSPITAL_BASED_OUTPATIENT_CLINIC_OR_DEPARTMENT_OTHER): Payer: Self-pay | Admitting: Obstetrics & Gynecology

## 2020-12-28 VITALS — BP 130/99 | HR 70 | Ht 65.0 in | Wt 162.6 lb

## 2020-12-28 DIAGNOSIS — Z79890 Hormone replacement therapy (postmenopausal): Secondary | ICD-10-CM | POA: Diagnosis not present

## 2020-12-28 DIAGNOSIS — Z780 Asymptomatic menopausal state: Secondary | ICD-10-CM | POA: Diagnosis not present

## 2020-12-28 DIAGNOSIS — K648 Other hemorrhoids: Secondary | ICD-10-CM

## 2020-12-28 DIAGNOSIS — Z90710 Acquired absence of both cervix and uterus: Secondary | ICD-10-CM

## 2020-12-28 DIAGNOSIS — Z90722 Acquired absence of ovaries, bilateral: Secondary | ICD-10-CM | POA: Diagnosis not present

## 2020-12-28 DIAGNOSIS — K625 Hemorrhage of anus and rectum: Secondary | ICD-10-CM

## 2020-12-28 DIAGNOSIS — Z23 Encounter for immunization: Secondary | ICD-10-CM | POA: Diagnosis not present

## 2020-12-28 DIAGNOSIS — Z01419 Encounter for gynecological examination (general) (routine) without abnormal findings: Secondary | ICD-10-CM

## 2020-12-28 DIAGNOSIS — Z9079 Acquired absence of other genital organ(s): Secondary | ICD-10-CM | POA: Diagnosis not present

## 2020-12-28 NOTE — Progress Notes (Signed)
80 y.o. G79P2002 Widowed White or Caucasian female here for breast and pelvic exam.  Denies vaginal bleeding.  Diagnosed with Afib earlier this year.  She is on eliquis.  She was placed on flecanide but she has side effects.  Off now.  She has been on Estradiol 0.5mg  1/2 tab.  Discussed with pt increased clotting risk with estradiol and recommended stopping this due to afib diagnosis.    She's had blood with wiping with bowel movements.  Has been on something for stool softening.  Wants to know what should do for treatment.  Patient's last menstrual period was 04/02/1988.          Sexually active: No.  H/O STD:  no  Health Maintenance: PCP:  Dr. Ardeth Perfect.  Last wellness appt was in September.  Did blood work at that appt:  yes Vaccines are up to date:  yes Colonoscopy:  05/11/2019 MMG:  05/10/2020 Diagnostic recommended, follow up was normal BMD:  06/18/2017, normal.   Last pap smear:  01/26/2016 Negative.   H/o abnormal pap smear:  no   reports that she quit smoking about 59 years ago. Her smoking use included cigarettes. She has never used smokeless tobacco. She reports that she does not drink alcohol and does not use drugs.  Past Medical History:  Diagnosis Date   Anal fissure    as a child   Blood transfusion without reported diagnosis    Diabetes mellitus    managed by diet and exercise   Diverticulosis of colon (without mention of hemorrhage)    Fatty liver    GERD (gastroesophageal reflux disease)    Heart murmur    Hyperlipidemia    Irritable bowel syndrome    Lymphocytic colitis    chronic   Neuromuscular disorder ([place])    neuropathy   Osteoarthritis    Osteopenia 09/2007   Other mucopurulent conjunctivitis    Premature ventricular contractions    Raynaud's syndrome    Rosacea    opitcal   Spinal stenosis     Past Surgical History:  Procedure Laterality Date   CATARACT EXTRACTION Bilateral    CHOLECYSTECTOMY     COLONOSCOPY     dental implants     [place] Bilateral 1973   Jaw Implants  2007   2018.2019   KNEE ARTHROSCOPY Right    lens implant     ovarian wedge resection  1964   REPLACEMENT TOTAL KNEE Bilateral 2005, 2007   TOTAL ABDOMINAL HYSTERECTOMY W/ BILATERAL SALPINGOOPHORECTOMY  1990   Excessive Bleeding    TOTAL KNEE ARTHROPLASTY Left 08/2003   TOTAL KNEE ARTHROPLASTY Right 08/2005    Current Outpatient Medications  Medication Sig Dispense Refill   apixaban (ELIQUIS) 5 MG TABS tablet Take 1 tablet (5 mg total) by mouth 2 (two) times daily. 60 tablet 5   Cholecalciferol (VITAMIN D) 50 MCG (2000 UT) tablet Take 2,000 Units by mouth daily.     Cyanocobalamin (B-12 IJ) Inject as directed every 30 (thirty) days.     dicyclomine (BENTYL) 20 MG tablet TAKE ONE TABLET EVERY 6 HOURS AS NEEDED 90 tablet 0   diphenoxylate-atropine (LOMOTIL) 2.5-0.025 MG per tablet TAKE ONE (1) TABLET FOUR TIMES EACH DAY AS NEEDED FOR DIARRHEA 30 tablet 0   estradiol (ESTRACE) 0.5 MG tablet TAKE ONE-HALF TABLET EVERY OTHER DAY 45 tablet 0   FIBER PO Take by mouth.  JARDIANCE 10 MG TABS tablet Take 1 tablet by mouth daily.     metoprolol succinate (TOPROL XL) 25 MG 24 hr tablet Take 0.5 tablets (12.5 mg total) by mouth daily. 45 tablet 3   naproxen sodium (ALEVE) 220 MG tablet Take 220 mg by mouth as directed. PRN     omeprazole (PRILOSEC) 40 MG capsule TAKE ONE CAPSULE EACH DAY 90 capsule 3   potassium chloride (KLOR-CON) 10 MEQ tablet Take 2 tablets (20 mEq total) by mouth daily. 180 tablet 3   Probiotic Product (ALIGN PO) Take by mouth.     Psyllium (METAMUCIL PO) Take by mouth daily.     Pyridoxine HCl (B-6 PO) Take by mouth.     spironolactone-hydrochlorothiazide (ALDACTAZIDE) 25-25 MG per tablet TAKE ONE TABLET EACH DAY 30 tablet 5   No current facility-administered medications for this visit.    Family History  Problem Relation Age of Onset   Lung cancer Sister    Osteoporosis  Mother    Heart disease Father    Diabetes Paternal Uncle    Ovarian cancer Maternal Grandmother    Breast cancer Sister 40   Colon cancer Neg Hx    Stomach cancer Neg Hx    Esophageal cancer Neg Hx    Rectal cancer Neg Hx     Review of Systems  Constitutional: Negative.   Gastrointestinal: Negative.   Genitourinary: Negative.    Exam:   BP (!) 130/99 (BP Location: Right Arm, Patient Position: Sitting, Cuff Size: Small)   Pulse 70   Ht 5\' 5"  (1.651 m) Comment: reported  Wt 162 lb 9.6 oz (73.8 kg)   LMP 04/02/1988   BMI 27.06 kg/m   Height: 5\' 5"  (165.1 cm) (reported)  General appearance: alert, cooperative and appears stated age Breasts: normal appearance, no masses or tenderness Abdomen: soft, non-tender; bowel sounds normal; no masses,  no organomegaly Lymph nodes: Cervical, supraclavicular, and axillary nodes normal.  No abnormal inguinal nodes palpated Neurologic: Grossly normal  Pelvic: External genitalia:  no lesions              Urethra:  normal appearing urethra with no masses, tenderness or lesions              Bartholins and Skenes: normal                 Vagina: normal appearing vagina with atrophic changes and no discharge, no lesions              Cervix: absent              Pap taken: No. Bimanual Exam:  Uterus:  uterus absent              Adnexa: no mass, fullness, tenderness               Rectovaginal: Confirms               Anus:  normal sphincter tone, no lesions, internal hemorrhoids, no masses  Chaperone, Octaviano Batty, CMA, was present for exam.  Assessment/Plan: 1. Encntr for gyn exam (general) (routine) w/o abn findings - pap smear not indicated - MMG 05/10/2020 - Colonoscopy 05/11/2019 - BMD 2019, normal.  Plan to repeat 1-2 years - lab work done with Dr. Ardeth Perfect - vaccines updated/care gaps reviewed  2. Flu vaccine need - Flu Vaccine QUAD High Dose(Fluad)  3. Rectal bleeding - will refer back to GI for additional evaluation, possible  hemorrhoid banding  4. Internal hemorrhoids  5. Postmenopausal  6. Hormone replacement therapy (HRT) - have advised pt stop HRT at this time due to afib   7. S/P TAH-BSO

## 2021-01-11 ENCOUNTER — Encounter: Payer: Self-pay | Admitting: Internal Medicine

## 2021-01-11 ENCOUNTER — Ambulatory Visit (INDEPENDENT_AMBULATORY_CARE_PROVIDER_SITE_OTHER): Payer: Medicare Other | Admitting: Internal Medicine

## 2021-01-11 ENCOUNTER — Ambulatory Visit: Payer: Medicare Other | Admitting: Cardiology

## 2021-01-11 VITALS — BP 100/72 | HR 70 | Ht 65.0 in | Wt 167.0 lb

## 2021-01-11 DIAGNOSIS — R194 Change in bowel habit: Secondary | ICD-10-CM | POA: Diagnosis not present

## 2021-01-11 DIAGNOSIS — K649 Unspecified hemorrhoids: Secondary | ICD-10-CM | POA: Diagnosis not present

## 2021-01-11 DIAGNOSIS — K625 Hemorrhage of anus and rectum: Secondary | ICD-10-CM | POA: Diagnosis not present

## 2021-01-11 MED ORDER — HYDROCORTISONE (PERIANAL) 2.5 % EX CREA: 1.0000 "application " | TOPICAL_CREAM | Freq: Every day | CUTANEOUS | 1 refills | Status: DC

## 2021-01-11 NOTE — Progress Notes (Signed)
HISTORY OF PRESENT ILLNESS:  Brittney Fox is a 80 y.o. female with multiple medical problems as listed below.  She presents today regarding transient rectal bleeding and irregular bowel habits.  She was recently diagnosed with atrial fibrillation for which she is on chronic anticoagulation therapy.  I last saw the patient May 19, 2019 when she underwent colonoscopy and upper endoscopy.  Colonoscopy was being performed due to left lower quadrant pain, abnormal CT scan, and change in bowel habits.  The examination was complete to the cecum.  1 diminutive sessile serrated polyp removed.  Left-sided diverticulosis and internal hemorrhoids noted.  Upper endoscopy revealed mild esophagitis and superficial antral ulcers with erosions.  Told to avoid NSAIDs.  Placed on omeprazole 40 mg daily, which she still takes.  Patient tells me that her bowel habits are somewhat erratic.  Often needs to go multiple times in the morning.  May have a urgency.  Will take Imodium so that she can leave the house.  She does complain of increased intestinal gas later in the day.  In terms of rectal bleeding, 1 episode with moderate bleeding.  Thereafter 1 minor episode.  None prior or since.  No associated rectal pain.  Review of blood work from November 15, 2020 shows a hemoglobin A1c of 6.5 CT scan of the abdomen and pelvis December 2536 revealed uncomplicated diverticulitis for which she was treated as well as a periampullary diverticulum.  REVIEW OF SYSTEMS:  All non-GI ROS negative unless otherwise stated in the HPI except for irregular heartbeat  Past Medical History:  Diagnosis Date   Anal fissure    as a child   Blood transfusion without reported diagnosis    Diabetes mellitus    managed by diet and exercise   Diverticulosis of colon (without mention of hemorrhage)    Fatty liver    GERD (gastroesophageal reflux disease)    Heart murmur    Hyperlipidemia    Irritable bowel syndrome    Lymphocytic  colitis    chronic   Neuromuscular disorder (HCC)    neuropathy   Osteoarthritis    Osteopenia 09/2007   Other mucopurulent conjunctivitis    Premature ventricular contractions    Raynaud's syndrome    Rosacea    opitcal   Spinal stenosis     Past Surgical History:  Procedure Laterality Date   CATARACT EXTRACTION Bilateral    CHOLECYSTECTOMY     COLONOSCOPY     dental implants     DILATION AND CURETTAGE OF UTERUS  1990   FOOT SURGERY     HIP SURGERY Bilateral 1973   Jaw Implants  2007   2018.2019   KNEE ARTHROSCOPY Right    lens implant     ovarian wedge resection  1964   REPLACEMENT TOTAL KNEE Bilateral 2005, 2007   TOTAL ABDOMINAL HYSTERECTOMY W/ BILATERAL SALPINGOOPHORECTOMY  1990   Excessive Bleeding    TOTAL KNEE ARTHROPLASTY Left 08/2003   TOTAL KNEE ARTHROPLASTY Right 08/2005    Social History Brittney Fox  reports that she quit smoking about 59 years ago. Her smoking use included cigarettes. She has never used smokeless tobacco. She reports that she does not drink alcohol and does not use drugs.  family history includes Breast cancer (age of onset: 30) in her sister; Diabetes in her paternal uncle; Heart disease in her father; Lung cancer in her sister; Osteoporosis in her mother; Ovarian cancer in her maternal grandmother.  Allergies  Allergen Reactions   Amoxicillin  Other (See Comments)    Ulcers and thrush   Codeine Phosphate     REACTION: headache   Morphine Sulfate     REACTION: tongue swelling       PHYSICAL EXAMINATION: Vital signs: BP 100/72   Pulse 70   Ht 5\' 5"  (1.651 m)   Wt 167 lb (75.8 kg)   LMP 04/02/1988   BMI 27.79 kg/m   Constitutional: generally well-appearing, no acute distress Psychiatric: alert and oriented x3, cooperative Eyes: extraocular movements intact, anicteric, conjunctiva pink Mouth: oral pharynx moist, no lesions Neck: supple no lymphadenopathy Cardiovascular: heart irregular rate and rhythm, no  murmur Lungs: clear to auscultation bilaterally Abdomen: soft, nontender, nondistended, no obvious ascites, no peritoneal signs, normal bowel sounds, no organomegaly Rectal: Omitted Extremities: no clubbing or cyanosis.  1+ lower extremity edema bilaterally Skin: no lesions on visible extremities Neuro: No focal deficits.  Cranial nerves intact  ASSESSMENT:  1.  Minor hemorrhoidal bleeding. 2.  Irregular bowel habits as described 3.  Known diverticulosis with a history of diverticulitis 4.  GERD with esophagitis.  On PPI 5.  NSAID erosions and ulcers of the gastric mucosa.  On PPI   PLAN:  1.  Recommend Citrucel 2 tablespoons daily to help regulate her bowels. 2.  Prescribe Anusol HC suppositories (or equivalent).  #30.  Multiple refills.  1 per rectum at night as needed 3.  Reflux precautions 4.  Continue PPI 5.  Continue to avoid NSAIDs 6.  Routine GI follow-up 1 year

## 2021-01-11 NOTE — Patient Instructions (Signed)
If you are age 79 or older, your body mass index should be between 23-30. Your Body mass index is 27.79 kg/m. If this is out of the aforementioned range listed, please consider follow up with your Primary Care Provider.  If you are age 61 or younger, your body mass index should be between 19-25. Your Body mass index is 27.79 kg/m. If this is out of the aformentioned range listed, please consider follow up with your Primary Care Provider.   __________________________________________________________  The  GI providers would like to encourage you to use [facility] to communicate with providers for non-urgent requests or questions.  Due to long hold times on the telephone, sending your provider a message by [facility] may be a faster and more efficient way to get a response.  Please allow 48 business hours for a response.  Please remember that this is for non-urgent requests.   We have sent the following medications to your pharmacy for you to pick up at your convenience: Anusol HC cream  Purchase Preparation H suppositories over the counter - apply a small amount of cream to the suppository and insert at bedtime  Take 2 tablespoons of Citrucel daily

## 2021-01-26 ENCOUNTER — Telehealth: Payer: Self-pay | Admitting: Cardiovascular Disease

## 2021-01-26 DIAGNOSIS — I480 Paroxysmal atrial fibrillation: Secondary | ICD-10-CM

## 2021-01-26 MED ORDER — METOPROLOL SUCCINATE ER 25 MG PO TB24: 25.0000 mg | ORAL_TABLET | Freq: Every day | ORAL | 3 refills | Status: DC

## 2021-01-26 NOTE — Telephone Encounter (Signed)
Placed order for Toprol XL 25 mg to patient's pharmacy. Placed order for referral to a. Fib clinic, and will send them a message to see if patient can get in next week.

## 2021-01-26 NOTE — Addendum Note (Signed)
Addended by: Aris Georgia, Lachell Rochette L on: 01/26/2021 12:21 PM   Modules accepted: Orders

## 2021-01-26 NOTE — Telephone Encounter (Signed)
Patient contacted me about recurrent atrial fibrillation with elevated heart rate. She is in [place] on vacation and is coming back to [place] late tonight. She is having mild associated symptoms. I advised her to increase Toprol XL to 25 mg daily, continue Eliquis, and I would like her to follow-up closely in the AF Clinic next week if there is availability. Please call in Rx for the increased dose of Toprol XL to [facility]. Please refer to AF clinic. Might consider Flecainide versus referral back to Dr Quentin Ore for consideration of ablation. Will forward to Dr Quentin Ore as an Juluis Rainier.  Thx Ronalee Belts

## 2021-01-27 ENCOUNTER — Other Ambulatory Visit (HOSPITAL_BASED_OUTPATIENT_CLINIC_OR_DEPARTMENT_OTHER): Payer: Self-pay | Admitting: Obstetrics & Gynecology

## 2021-01-27 ENCOUNTER — Encounter (HOSPITAL_BASED_OUTPATIENT_CLINIC_OR_DEPARTMENT_OTHER): Payer: Self-pay | Admitting: Obstetrics & Gynecology

## 2021-01-27 DIAGNOSIS — N390 Urinary tract infection, site not specified: Secondary | ICD-10-CM | POA: Diagnosis not present

## 2021-01-27 DIAGNOSIS — R300 Dysuria: Secondary | ICD-10-CM | POA: Diagnosis not present

## 2021-01-27 MED ORDER — SULFAMETHOXAZOLE-TRIMETHOPRIM 800-160 MG PO TABS: 1.0000 | ORAL_TABLET | Freq: Two times a day (BID) | ORAL | 0 refills | Status: DC

## 2021-01-27 NOTE — Progress Notes (Signed)
Pt communicated with me about possible UTI symptoms.  Rx for bactrim DS bid x 3 days sent to pharmacy on file.

## 2021-01-28 ENCOUNTER — Other Ambulatory Visit (HOSPITAL_BASED_OUTPATIENT_CLINIC_OR_DEPARTMENT_OTHER): Payer: Self-pay | Admitting: Obstetrics & Gynecology

## 2021-01-28 MED ORDER — FLUCONAZOLE 150 MG PO TABS: 150.0000 mg | ORAL_TABLET | Freq: Once | ORAL | 0 refills | Status: DC

## 2021-02-01 DIAGNOSIS — M7989 Other specified soft tissue disorders: Secondary | ICD-10-CM | POA: Diagnosis not present

## 2021-02-01 DIAGNOSIS — R0989 Other specified symptoms and signs involving the circulatory and respiratory systems: Secondary | ICD-10-CM | POA: Diagnosis not present

## 2021-02-01 DIAGNOSIS — E119 Type 2 diabetes mellitus without complications: Secondary | ICD-10-CM | POA: Diagnosis not present

## 2021-02-01 DIAGNOSIS — I4891 Unspecified atrial fibrillation: Secondary | ICD-10-CM | POA: Diagnosis not present

## 2021-02-01 DIAGNOSIS — Z87891 Personal history of nicotine dependence: Secondary | ICD-10-CM | POA: Diagnosis not present

## 2021-02-01 DIAGNOSIS — I872 Venous insufficiency (chronic) (peripheral): Secondary | ICD-10-CM | POA: Diagnosis not present

## 2021-02-01 NOTE — Progress Notes (Signed)
Electrophysiology Office Follow up Visit Note:    Date:  02/02/2021   ID:  Brittney Fox, DOB 11-17-1940, MRN [id number]  PCP:  Velna Hatchet, MD  [facility] Cardiologist:  [name], MD  [facility] Electrophysiologist:  Vickie Epley, MD    Interval History:    Brittney Fox is a 80 y.o. female who presents for a follow up visit. They were last seen in clinic 12/22/2020 for her AF. She was previously trialed on flecainide but she did not tolerate this medication. She was also on metoprolol. She takes eliquis for stroke ppx.  She recently was on vacation to [place] and had recurrent episodes of AF. She presents today to discuss alternative rhythm control strategies.  She is feeling good now she is very active.  She is still working.  She is interested in a rhythm control strategy that avoids long-term exposure to antiarrhythmic drugs given her previous intolerance to flecainide.     Past Medical History:  Diagnosis Date   Anal fissure    as a child   Blood transfusion without reported diagnosis    Diabetes mellitus    managed by diet and exercise   Diverticulosis of colon (without mention of hemorrhage)    Fatty liver    GERD (gastroesophageal reflux disease)    Heart murmur    Hyperlipidemia    Irritable bowel syndrome    Lymphocytic colitis    chronic   Neuromuscular disorder ([place])    neuropathy   Osteoarthritis    Osteopenia 09/2007   Other mucopurulent conjunctivitis    Premature ventricular contractions    Raynaud's syndrome    Rosacea    opitcal   Spinal stenosis     Past Surgical History:  Procedure Laterality Date   CATARACT EXTRACTION Bilateral    CHOLECYSTECTOMY     COLONOSCOPY     dental implants     [place] Bilateral 1973   Jaw Implants  2007   2018.2019   KNEE ARTHROSCOPY Right    lens implant     ovarian wedge resection  1964   REPLACEMENT TOTAL KNEE  Bilateral 2005, 2007   TOTAL ABDOMINAL HYSTERECTOMY W/ BILATERAL SALPINGOOPHORECTOMY  1990   Excessive Bleeding    TOTAL KNEE ARTHROPLASTY Left 08/2003   TOTAL KNEE ARTHROPLASTY Right 08/2005    Current Medications: Current Meds  Medication Sig   apixaban (ELIQUIS) 5 MG TABS tablet Take 1 tablet (5 mg total) by mouth 2 (two) times daily.   cephALEXin (KEFLEX) 500 MG capsule Take 500 mg by mouth 2 (two) times daily.   Cholecalciferol (VITAMIN D) 50 MCG (2000 UT) tablet Take 2,000 Units by mouth daily.   Cyanocobalamin (B-12 IJ) Inject as directed every 30 (thirty) days.   dicyclomine (BENTYL) 20 MG tablet TAKE ONE TABLET EVERY 6 HOURS AS NEEDED   diphenoxylate-atropine (LOMOTIL) 2.5-0.025 MG per tablet TAKE ONE (1) TABLET FOUR TIMES EACH DAY AS NEEDED FOR DIARRHEA   FIBER PO Take by mouth.   hydrocortisone (ANUSOL-HC) 2.5 % rectal cream Place 1 application rectally at bedtime.   JARDIANCE 10 MG TABS tablet Take 1 tablet by mouth daily.   metoprolol succinate (TOPROL XL) 25 MG 24 hr tablet Take 1 tablet (25 mg total) by mouth daily.   naproxen sodium (ALEVE) 220 MG tablet Take 220 mg by mouth as directed. PRN   omeprazole (PRILOSEC) 40 MG capsule TAKE  ONE CAPSULE EACH DAY   potassium chloride (KLOR-CON) 10 MEQ tablet Take 2 tablets (20 mEq total) by mouth daily.   Probiotic Product (ALIGN PO) Take by mouth.   Psyllium (METAMUCIL PO) Take by mouth daily.   Pyridoxine HCl (B-6 PO) Take by mouth.   spironolactone-hydrochlorothiazide (ALDACTAZIDE) 25-25 MG per tablet TAKE ONE TABLET EACH DAY     Allergies:   Amoxicillin, Codeine phosphate, and Morphine sulfate   Social History   Socioeconomic History   Marital status: Widowed    Spouse name: Not on file   Number of children: 2   Years of education: Not on file   Highest education level: Not on file  Occupational History   Occupation: REAL ESTATE    Employer: [employer]  Tobacco Use   Smoking status: Former     Types: Cigarettes    Quit date: 10/22/1961    Years since quitting: 59.3   Smokeless tobacco: Never  Vaping Use   Vaping Use: Never used  Substance and Sexual Activity   Alcohol use: No    Alcohol/week: 0.0 standard drinks   Drug use: No   Sexual activity: Not Currently    Partners: Male    Birth control/protection: Surgical    Comment: hysterectomy  Other Topics Concern   Not on file  Social History Narrative   Not on file   Social Determinants of Health   Financial Resource Strain: Not on file  Food Insecurity: Not on file  Transportation Needs: Not on file  Physical Activity: Not on file  Stress: Not on file  Social Connections: Not on file     Family History: The patient's family history includes Breast cancer (age of onset: 92) in her sister; Diabetes in her paternal uncle; Heart disease in her father; Lung cancer in her sister; Osteoporosis in her mother; Ovarian cancer in her maternal grandmother. There is no history of Colon cancer, Stomach cancer, Esophageal cancer, or Rectal cancer.  ROS:   Please see the history of present illness.    All other systems reviewed and are negative.  EKGs/Labs/Other Studies Reviewed:    The following studies were reviewed today:   EKG:  The ekg ordered today demonstrates sinus rhythm.  Low voltage QRS.  Recent Labs: 11/02/2020: BUN 18; Creatinine, Ser 1.02; Hemoglobin 16.5; Platelets 284; Potassium 3.7; Sodium 136  Recent Lipid Panel    Component Value Date/Time   CHOL 144 09/13/2015 0734   TRIG 87 09/13/2015 0734   TRIG 88 02/06/2006 1028   HDL 59 09/13/2015 0734   CHOLHDL 2.4 09/13/2015 0734   VLDL 17 09/13/2015 0734   LDLCALC 68 09/13/2015 0734   LDLDIRECT 150.2 11/28/2010 1131    Physical Exam:    VS:  BP 126/74   Pulse 62   Ht 5\' 5"  (1.651 m)   Wt 165 lb 6.4 oz (75 kg)   LMP 04/02/1988   SpO2 98%   BMI 27.52 kg/m     Wt Readings from Last 3 Encounters:  02/02/21 165 lb 6.4 oz (75 kg)  01/11/21 167 lb  (75.8 kg)  12/28/20 162 lb 9.6 oz (73.8 kg)     GEN:  Well nourished, well developed in no acute distress.  Appears younger than stated age 18: Normal NECK: No JVD; No carotid bruits LYMPHATICS: No lymphadenopathy CARDIAC: RRR, no murmurs, rubs, gallops RESPIRATORY:  Clear to auscultation without rales, wheezing or rhonchi  ABDOMEN: Soft, non-tender, non-distended MUSCULOSKELETAL:  No edema; No deformity  SKIN:  Warm and dry NEUROLOGIC:  Alert and oriented x 3 PSYCHIATRIC:  Normal affect        ASSESSMENT:    1. Paroxysmal atrial fibrillation (HCC)   2. Essential hypertension    PLAN:    In order of problems listed above:  #Paroxysmal atrial fibrillation Symptomatic episodes seem to be more frequent.  She is previously not tolerated antiarrhythmic drugs.  She takes Eliquis for stroke prophylaxis.  I discussed rhythm control strategies including alternative antiarrhythmic drugs versus catheter ablation during today's visit.  We discussed the success rates and pros and cons of each strategy.  I do not think that she is likely to tolerate alternative antiarrhythmic drugs given her previous intolerance to flecainide.  She is a great candidate for catheter ablation.  We discussed the procedure in detail include the risk, recovery and likelihood of success.  She would like to proceed with scheduling.  Will need an echo and CT scan prior to the procedure.  Risk, benefits, and alternatives to EP study and radiofrequency ablation for afib were also discussed in detail today. These risks include but are not limited to stroke, bleeding, vascular damage, tamponade, perforation, damage to the esophagus, lungs, and other structures, pulmonary vein stenosis, worsening renal function, and death. The patient understands these risk and wishes to proceed.  We will therefore proceed with catheter ablation at the next available time.  Carto, ICE, anesthesia are requested for the procedure.  Will  also obtain CT PV protocol prior to the procedure to exclude LAA thrombus and further evaluate atrial anatomy.  #Hypertension Controlled.  Continue current regimen.    Total time spent with patient today 40 minutes. This includes reviewing records, evaluating the patient and coordinating care.   Medication Adjustments/Labs and Tests Ordered: Current medicines are reviewed at length with the patient today.  Concerns regarding medicines are outlined above.  Orders Placed This Encounter  Procedures   EKG 12-Lead   No orders of the defined types were placed in this encounter.    Signed, Lars Mage, MD, [facility], [facility] 02/02/2021 9:52 AM    Electrophysiology [facility]

## 2021-02-02 ENCOUNTER — Ambulatory Visit (INDEPENDENT_AMBULATORY_CARE_PROVIDER_SITE_OTHER): Payer: Medicare Other | Admitting: Cardiology

## 2021-02-02 ENCOUNTER — Encounter: Payer: Self-pay | Admitting: Cardiology

## 2021-02-02 ENCOUNTER — Other Ambulatory Visit: Payer: Self-pay

## 2021-02-02 VITALS — BP 126/74 | HR 62 | Ht 65.0 in | Wt 165.4 lb

## 2021-02-02 DIAGNOSIS — I10 Essential (primary) hypertension: Secondary | ICD-10-CM

## 2021-02-02 DIAGNOSIS — I4891 Unspecified atrial fibrillation: Secondary | ICD-10-CM

## 2021-02-02 DIAGNOSIS — I480 Paroxysmal atrial fibrillation: Secondary | ICD-10-CM

## 2021-02-02 NOTE — Patient Instructions (Addendum)
Medication Instructions:  Your physician recommends that you continue on your current medications as directed. Please refer to the Current Medication list given to you today. *If you need a refill on your cardiac medications before your next appointment, please call your pharmacy*  Lab Work: None ordered. If you have labs (blood work) drawn today and your tests are completely normal, you will receive your results only by: [place] (if you have MyChart) OR A paper copy in the mail If you have any lab test that is abnormal or we need to change your treatment, we will call you to review the results.  Testing/Procedures: Your physician has requested that you have an echocardiogram. Echocardiography is a painless test that uses sound waves to create images of your heart. It provides your doctor with information about the size and shape of your heart and how well your heart's chambers and valves are working. This procedure takes approximately one hour. There are no restrictions for this procedure.  Please schedule for an echo.  Follow-Up: SEE INSTRUCTION LETTER  Cardiac Ablation Cardiac ablation is a procedure to destroy, or ablate, a small amount of heart tissue in very specific places. The heart has many electrical connections. Sometimes these connections are abnormal and can cause the heart to beat very fast or irregularly. Ablating some of the areas that cause problems can improve the heart's rhythm or return it to normal. Ablation may be done for people who: Have Wolff-Parkinson-White syndrome. Have fast heart rhythms (tachycardia). Have taken medicines for an abnormal heart rhythm (arrhythmia) that were not effective or caused side effects. Have a high-risk heartbeat that may be life-threatening. During the procedure, a small incision is made in the neck or the groin, and a long, thin tube (catheter) is inserted into the incision and moved to the heart. Small devices (electrodes) on  the tip of the catheter will send out electrical currents. A type of X-ray (fluoroscopy) will be used to help guide the catheter and to provide images of the heart. Tell a health care provider about: Any allergies you have. All medicines you are taking, including vitamins, herbs, eye drops, creams, and over-the-counter medicines. Any problems you or family members have had with anesthetic medicines. Any blood disorders you have. Any surgeries you have had. Any medical conditions you have, such as kidney failure. Whether you are pregnant or may be pregnant. What are the risks? Generally, this is a safe procedure. However, problems may occur, including: Infection. Bruising and bleeding at the catheter insertion site. Bleeding into the chest, especially into the sac that surrounds the heart. This is a serious complication. Stroke or blood clots. Damage to nearby structures or organs. Allergic reaction to medicines or dyes. Need for a permanent pacemaker if the normal electrical system is damaged. A pacemaker is a small computer that sends electrical signals to the heart and helps your heart beat normally. The procedure not being fully effective. This may not be recognized until months later. Repeat ablation procedures are sometimes done. What happens before the procedure? Medicines Ask your health care provider about: Changing or stopping your regular medicines. This is especially important if you are taking diabetes medicines or blood thinners. Taking medicines such as aspirin and ibuprofen. These medicines can thin your blood. Do not take these medicines unless your health care provider tells you to take them. Taking over-the-counter medicines, vitamins, herbs, and supplements. General instructions Follow instructions from your health care provider about eating or drinking restrictions. Plan to have  someone take you home from the hospital or clinic. If you will be going home right after  the procedure, plan to have someone with you for 24 hours. Ask your health care provider what steps will be taken to prevent infection. What happens during the procedure?  An IV will be inserted into one of your veins. You will be given a medicine to help you relax (sedative). The skin on your neck or groin will be numbed. An incision will be made in your neck or your groin. A needle will be inserted through the incision and into a large vein in your neck or groin. A catheter will be inserted into the needle and moved to your heart. Dye may be injected through the catheter to help your surgeon see the area of the heart that needs treatment. Electrical currents will be sent from the catheter to ablate heart tissue in desired areas. There are three types of energy that may be used to do this: Heat (radiofrequency energy). Laser energy. Extreme cold (cryoablation). When the tissue has been ablated, the catheter will be removed. Pressure will be held on the insertion area to prevent a lot of bleeding. A bandage (dressing) will be placed over the insertion area. The exact procedure may vary among health care providers and hospitals. What happens after the procedure? Your blood pressure, heart rate, breathing rate, and blood oxygen level will be monitored until you leave the hospital or clinic. Your insertion area will be monitored for bleeding. You will need to lie still for a few hours to ensure that you do not bleed from the insertion area. Do not drive for 24 hours or as long as told by your health care provider. Summary Cardiac ablation is a procedure to destroy, or ablate, a small amount of heart tissue using an electrical current. This procedure can improve the heart rhythm or return it to normal. Tell your health care provider about any medical conditions you may have and all medicines you are taking to treat them. This is a safe procedure, but problems may occur. Problems may include  infection, bruising, damage to nearby organs or structures, or allergic reactions to medicines. Follow your health care provider's instructions about eating and drinking before the procedure. You may also be told to change or stop some of your medicines. After the procedure, do not drive for 24 hours or as long as told by your health care provider. This information is not intended to replace advice given to you by your health care provider. Make sure you discuss any questions you have with your health care provider. Document Revised: 01/26/2019 Document Reviewed: 01/26/2019 Elsevier Patient Education  [place].

## 2021-02-08 ENCOUNTER — Ambulatory Visit (HOSPITAL_COMMUNITY): Payer: Medicare Other | Admitting: Nurse Practitioner

## 2021-02-09 ENCOUNTER — Other Ambulatory Visit: Payer: Self-pay | Admitting: Cardiovascular Disease

## 2021-02-09 DIAGNOSIS — Z23 Encounter for immunization: Secondary | ICD-10-CM | POA: Diagnosis not present

## 2021-02-20 ENCOUNTER — Other Ambulatory Visit: Payer: Self-pay

## 2021-02-20 ENCOUNTER — Ambulatory Visit (HOSPITAL_COMMUNITY): Payer: Medicare Other | Attending: Cardiology

## 2021-02-20 DIAGNOSIS — I480 Paroxysmal atrial fibrillation: Secondary | ICD-10-CM

## 2021-02-20 DIAGNOSIS — I10 Essential (primary) hypertension: Secondary | ICD-10-CM | POA: Diagnosis not present

## 2021-02-20 LAB — ECHOCARDIOGRAM COMPLETE
Area-P 1/2: 3.6 cm2
S' Lateral: 2.8 cm

## 2021-04-11 ENCOUNTER — Other Ambulatory Visit: Payer: Self-pay

## 2021-04-11 ENCOUNTER — Other Ambulatory Visit: Payer: Medicare Other | Admitting: *Deleted

## 2021-04-11 DIAGNOSIS — E538 Deficiency of other specified B group vitamins: Secondary | ICD-10-CM | POA: Diagnosis not present

## 2021-04-11 DIAGNOSIS — I4891 Unspecified atrial fibrillation: Secondary | ICD-10-CM | POA: Diagnosis not present

## 2021-04-11 DIAGNOSIS — I10 Essential (primary) hypertension: Secondary | ICD-10-CM

## 2021-04-11 DIAGNOSIS — E559 Vitamin D deficiency, unspecified: Secondary | ICD-10-CM | POA: Diagnosis not present

## 2021-04-11 DIAGNOSIS — R002 Palpitations: Secondary | ICD-10-CM | POA: Diagnosis not present

## 2021-04-11 DIAGNOSIS — E119 Type 2 diabetes mellitus without complications: Secondary | ICD-10-CM | POA: Diagnosis not present

## 2021-04-11 DIAGNOSIS — I480 Paroxysmal atrial fibrillation: Secondary | ICD-10-CM | POA: Diagnosis not present

## 2021-04-12 LAB — BASIC METABOLIC PANEL
BUN/Creatinine Ratio: 24 (ref 12–28)
BUN: 17 mg/dL (ref 8–27)
CO2: 22 mmol/L (ref 20–29)
Calcium: 9.8 mg/dL (ref 8.7–10.3)
Chloride: 96 mmol/L (ref 96–106)
Creatinine, Ser: 0.7 mg/dL (ref 0.57–1.00)
Glucose: 119 mg/dL — ABNORMAL HIGH (ref 70–99)
Potassium: 3.7 mmol/L (ref 3.5–5.2)
Sodium: 134 mmol/L (ref 134–144)
eGFR: 88 mL/min/{1.73_m2} (ref >59)

## 2021-04-12 LAB — CBC WITH DIFFERENTIAL/PLATELET
Basophils Absolute: 0 10*3/uL (ref 0.0–0.2)
Basos: 0 %
EOS (ABSOLUTE): 0.1 10*3/uL (ref 0.0–0.4)
Eos: 1 %
Hematocrit: 49.9 % — ABNORMAL HIGH (ref 34.0–46.6)
Hemoglobin: 16.8 g/dL — ABNORMAL HIGH (ref 11.1–15.9)
Immature Grans (Abs): 0 10*3/uL (ref 0.0–0.1)
Immature Granulocytes: 0 %
Lymphocytes Absolute: 2.1 10*3/uL (ref 0.7–3.1)
Lymphs: 27 %
MCH: 27.5 pg (ref 26.6–33.0)
MCHC: 33.7 g/dL (ref 31.5–35.7)
MCV: 82 fL (ref 79–97)
Monocytes Absolute: 0.6 10*3/uL (ref 0.1–0.9)
Monocytes: 8 %
Neutrophils Absolute: 4.9 10*3/uL (ref 1.4–7.0)
Neutrophils: 64 %
Platelets: 291 10*3/uL (ref 150–450)
RBC: 6.11 x10E6/uL — ABNORMAL HIGH (ref 3.77–5.28)
RDW: 14.2 % (ref 11.7–15.4)
WBC: 7.7 10*3/uL (ref 3.4–10.8)

## 2021-04-13 ENCOUNTER — Other Ambulatory Visit (HOSPITAL_BASED_OUTPATIENT_CLINIC_OR_DEPARTMENT_OTHER): Payer: Self-pay | Admitting: Obstetrics & Gynecology

## 2021-04-17 ENCOUNTER — Ambulatory Visit (HOSPITAL_COMMUNITY): Payer: Medicare Other

## 2021-04-18 ENCOUNTER — Ambulatory Visit: Payer: Medicare Other | Admitting: Cardiovascular Disease

## 2021-04-20 ENCOUNTER — Telehealth (HOSPITAL_COMMUNITY): Payer: Self-pay | Admitting: *Deleted

## 2021-04-20 NOTE — Telephone Encounter (Signed)
Reaching out to patient to offer assistance regarding upcoming cardiac imaging study; pt verbalizes understanding of appt date/time, parking situation and where to check in, pre-test NPO status; name and call back number provided for further questions should they arise  Gordy Clement RN Navigator Cardiac [place] [facility] [phone] office [phone] cell  Patient aware to arrive at 1pm for her 1:30pm scan.

## 2021-04-21 ENCOUNTER — Other Ambulatory Visit: Payer: Self-pay

## 2021-04-21 ENCOUNTER — Ambulatory Visit (HOSPITAL_COMMUNITY)
Admission: RE | Admit: 2021-04-21 | Discharge: 2021-04-21 | Disposition: A | Discharge status: Home / Self Care | Payer: Medicare Other | Source: Ambulatory Visit | Attending: Cardiology | Admitting: Cardiology

## 2021-04-21 DIAGNOSIS — I4891 Unspecified atrial fibrillation: Secondary | ICD-10-CM | POA: Diagnosis not present

## 2021-04-21 MED ORDER — IOHEXOL 350 MG/ML SOLN
100.0000 mL | Freq: Once | INTRAVENOUS | Status: AC | PRN
Start: 2021-04-21 — End: 2021-04-21
  Administered 2021-04-21: 100 mL via INTRAVENOUS

## 2021-04-23 NOTE — Anesthesia Preprocedure Evaluation (Addendum)
Anesthesia Evaluation  Patient identified by MRN, date of birth, ID band Patient awake    Reviewed: Allergy & Precautions, NPO status , Patient's Chart, lab work & pertinent test results, reviewed documented beta blocker date and time   History of Anesthesia Complications Negative for: history of anesthetic complications  Airway Mallampati: II  TM Distance: >3 FB Neck ROM: Full    Dental  (+) Teeth Intact, Missing, Dental Advisory Given   Pulmonary former smoker,    breath sounds clear to auscultation       Cardiovascular hypertension, Pt. on medications and Pt. on home beta blockers (-) angina+ dysrhythmias Atrial Fibrillation  Rhythm:Irregular Rate:Normal  01/2021 ECHO: EF 55-60%, normal LVF, normal RVF, possible PFO, trivial MR   Neuro/Psych negative neurological ROS     GI/Hepatic Neg liver ROS, GERD  Medicated and Controlled,  Endo/Other  diabetes (glu 123), Oral Hypoglycemic Agents  Renal/GU negative Renal ROS     Musculoskeletal   Abdominal   Peds  Hematology eliquis   Anesthesia Other Findings   Reproductive/Obstetrics                            Anesthesia Physical Anesthesia Plan  ASA: 3  Anesthesia Plan: General   Post-op Pain Management: Minimal or no pain anticipated and Tylenol PO (pre-op)   Induction: Intravenous  PONV Risk Score and Plan: 3 and Ondansetron, Dexamethasone and Treatment may vary due to age or medical condition  Airway Management Planned: Oral ETT  Additional Equipment: None  Intra-op Plan:   Post-operative Plan: Extubation in OR  Informed Consent: I have reviewed the patients History and Physical, chart, labs and discussed the procedure including the risks, benefits and alternatives for the proposed anesthesia with the patient or authorized representative who has indicated his/her understanding and acceptance.     Dental advisory given  Plan  Discussed with: CRNA and Surgeon  Anesthesia Plan Comments:        Anesthesia Quick Evaluation

## 2021-04-24 ENCOUNTER — Other Ambulatory Visit: Payer: Self-pay

## 2021-04-24 ENCOUNTER — Encounter (HOSPITAL_COMMUNITY)
Admission: RE | Disposition: A | Discharge status: Home / Self Care | Payer: Self-pay | Source: Home / Self Care | Attending: Cardiology

## 2021-04-24 ENCOUNTER — Ambulatory Visit (HOSPITAL_COMMUNITY): Payer: Medicare Other | Admitting: Anesthesiology

## 2021-04-24 ENCOUNTER — Ambulatory Visit (HOSPITAL_COMMUNITY)
Admission: RE | Admit: 2021-04-24 | Discharge: 2021-04-24 | Disposition: A | Discharge status: Home / Self Care | Payer: Medicare Other | Attending: Cardiology | Admitting: Cardiology

## 2021-04-24 DIAGNOSIS — E119 Type 2 diabetes mellitus without complications: Secondary | ICD-10-CM | POA: Diagnosis not present

## 2021-04-24 DIAGNOSIS — I484 Atypical atrial flutter: Secondary | ICD-10-CM | POA: Diagnosis not present

## 2021-04-24 DIAGNOSIS — Z7984 Long term (current) use of oral hypoglycemic drugs: Secondary | ICD-10-CM | POA: Diagnosis not present

## 2021-04-24 DIAGNOSIS — Z7901 Long term (current) use of anticoagulants: Secondary | ICD-10-CM | POA: Diagnosis not present

## 2021-04-24 DIAGNOSIS — I10 Essential (primary) hypertension: Secondary | ICD-10-CM | POA: Insufficient documentation

## 2021-04-24 DIAGNOSIS — Z79899 Other long term (current) drug therapy: Secondary | ICD-10-CM | POA: Insufficient documentation

## 2021-04-24 DIAGNOSIS — I4891 Unspecified atrial fibrillation: Secondary | ICD-10-CM | POA: Diagnosis not present

## 2021-04-24 DIAGNOSIS — I480 Paroxysmal atrial fibrillation: Secondary | ICD-10-CM | POA: Diagnosis not present

## 2021-04-24 HISTORY — PX: ATRIAL FIBRILLATION ABLATION: EP1191

## 2021-04-24 LAB — POCT ACTIVATED CLOTTING TIME
Activated Clotting Time: 287 seconds
Activated Clotting Time: 323 seconds
Activated Clotting Time: 317 seconds
Activated Clotting Time: 347 seconds

## 2021-04-24 LAB — GLUCOSE, CAPILLARY
Glucose-Capillary: 123 mg/dL — ABNORMAL HIGH (ref 70–99)
Glucose-Capillary: 142 mg/dL — ABNORMAL HIGH (ref 70–99)

## 2021-04-24 SURGERY — ATRIAL FIBRILLATION ABLATION
Anesthesia: General

## 2021-04-24 MED ORDER — APIXABAN 5 MG PO TABS
5.0000 mg | ORAL_TABLET | Freq: Two times a day (BID) | ORAL | Status: DC
  Filled 2021-04-24: qty 1

## 2021-04-24 MED ORDER — SODIUM CHLORIDE 0.9% FLUSH: 3.0000 mL | Freq: Two times a day (BID) | INTRAVENOUS | Status: DC

## 2021-04-24 MED ORDER — ISOPROTERENOL HCL 0.2 MG/ML IJ SOLN
INTRAMUSCULAR | Status: DC | PRN
  Administered 2021-04-24: 2 ug/min via INTRAVENOUS

## 2021-04-24 MED ORDER — FENTANYL CITRATE (PF) 100 MCG/2ML IJ SOLN
INTRAMUSCULAR | Status: DC | PRN
Start: 2021-04-24 — End: 2021-04-24
  Administered 2021-04-24 (×2): 50 ug via INTRAVENOUS

## 2021-04-24 MED ORDER — FENTANYL CITRATE (PF) 100 MCG/2ML IJ SOLN
INTRAMUSCULAR | Status: AC
  Filled 2021-04-24: qty 2

## 2021-04-24 MED ORDER — HEPARIN (PORCINE) IN NACL 1000-0.9 UT/500ML-% IV SOLN
INTRAVENOUS | Status: AC
  Filled 2021-04-24: qty 1500

## 2021-04-24 MED ORDER — LIDOCAINE 2% (20 MG/ML) 5 ML SYRINGE
INTRAMUSCULAR | Status: DC | PRN
Start: 2021-04-24 — End: 2021-04-24
  Administered 2021-04-24: 40 mg via INTRAVENOUS

## 2021-04-24 MED ORDER — SODIUM CHLORIDE 0.9 % IV SOLN: 250.0000 mL | INTRAVENOUS | Status: DC | PRN

## 2021-04-24 MED ORDER — COLCHICINE 0.6 MG PO TABS: 0.6000 mg | ORAL_TABLET | Freq: Two times a day (BID) | ORAL | 0 refills | Status: DC

## 2021-04-24 MED ORDER — ONDANSETRON HCL 4 MG/2ML IJ SOLN
INTRAMUSCULAR | Status: DC | PRN
  Administered 2021-04-24: 4 mg via INTRAVENOUS

## 2021-04-24 MED ORDER — FENTANYL CITRATE (PF) 100 MCG/2ML IJ SOLN
25.0000 ug | INTRAMUSCULAR | Status: DC | PRN
  Administered 2021-04-24: 25 ug via INTRAVENOUS

## 2021-04-24 MED ORDER — ROCURONIUM BROMIDE 10 MG/ML (PF) SYRINGE
PREFILLED_SYRINGE | INTRAVENOUS | Status: DC | PRN
  Administered 2021-04-24: 60 mg via INTRAVENOUS

## 2021-04-24 MED ORDER — PHENYLEPHRINE HCL-NACL 20-0.9 MG/250ML-% IV SOLN
INTRAVENOUS | Status: DC | PRN
  Administered 2021-04-24: 20 ug/min via INTRAVENOUS

## 2021-04-24 MED ORDER — DEXAMETHASONE SODIUM PHOSPHATE 10 MG/ML IJ SOLN
INTRAMUSCULAR | Status: DC | PRN
  Administered 2021-04-24: 4 mg via INTRAVENOUS

## 2021-04-24 MED ORDER — HEPARIN SODIUM (PORCINE) 1000 UNIT/ML IJ SOLN
INTRAMUSCULAR | Status: AC
  Filled 2021-04-24: qty 10

## 2021-04-24 MED ORDER — PANTOPRAZOLE SODIUM 40 MG PO TBEC
40.0000 mg | DELAYED_RELEASE_TABLET | Freq: Every day | ORAL | Status: DC
  Filled 2021-04-24: qty 1

## 2021-04-24 MED ORDER — SODIUM CHLORIDE 0.9 % IV SOLN: INTRAVENOUS | Status: DC

## 2021-04-24 MED ORDER — ACETAMINOPHEN 325 MG PO TABS
ORAL_TABLET | ORAL | Status: AC
  Filled 2021-04-24: qty 2

## 2021-04-24 MED ORDER — HEPARIN (PORCINE) IN NACL 1000-0.9 UT/500ML-% IV SOLN
INTRAVENOUS | Status: DC | PRN
  Administered 2021-04-24 (×4): 500 mL

## 2021-04-24 MED ORDER — SUGAMMADEX SODIUM 200 MG/2ML IV SOLN
INTRAVENOUS | Status: DC | PRN
  Administered 2021-04-24: 150 mg via INTRAVENOUS

## 2021-04-24 MED ORDER — HEPARIN SODIUM (PORCINE) 1000 UNIT/ML IJ SOLN
INTRAMUSCULAR | Status: DC | PRN
  Administered 2021-04-24: 1000 [IU] via INTRAVENOUS

## 2021-04-24 MED ORDER — ISOPROTERENOL HCL 0.2 MG/ML IJ SOLN
INTRAMUSCULAR | Status: AC
  Filled 2021-04-24: qty 5

## 2021-04-24 MED ORDER — HEPARIN SODIUM (PORCINE) 1000 UNIT/ML IJ SOLN
INTRAMUSCULAR | Status: DC | PRN
  Administered 2021-04-24: 4000 [IU] via INTRAVENOUS
  Administered 2021-04-24: 11000 [IU] via INTRAVENOUS
  Administered 2021-04-24: 3000 [IU] via INTRAVENOUS
  Administered 2021-04-24: 2000 [IU] via INTRAVENOUS

## 2021-04-24 MED ORDER — ONDANSETRON HCL 4 MG/2ML IJ SOLN: 4.0000 mg | Freq: Four times a day (QID) | INTRAMUSCULAR | Status: DC | PRN

## 2021-04-24 MED ORDER — PROTAMINE SULFATE 10 MG/ML IV SOLN
INTRAVENOUS | Status: DC | PRN
  Administered 2021-04-24 (×2): 10 mg via INTRAVENOUS
  Administered 2021-04-24: 15 mg via INTRAVENOUS

## 2021-04-24 MED ORDER — COLCHICINE 0.6 MG PO TABS
0.6000 mg | ORAL_TABLET | Freq: Two times a day (BID) | ORAL | Status: DC
  Filled 2021-04-24: qty 1

## 2021-04-24 MED ORDER — SODIUM CHLORIDE 0.9% FLUSH: 3.0000 mL | INTRAVENOUS | Status: DC | PRN

## 2021-04-24 MED ORDER — PROPOFOL 10 MG/ML IV BOLUS
INTRAVENOUS | Status: DC | PRN
Start: 2021-04-24 — End: 2021-04-24
  Administered 2021-04-24: 100 mg via INTRAVENOUS

## 2021-04-24 MED ORDER — ACETAMINOPHEN 325 MG PO TABS
650.0000 mg | ORAL_TABLET | ORAL | Status: DC | PRN
  Administered 2021-04-24: 650 mg via ORAL

## 2021-04-24 MED ORDER — ACETAMINOPHEN 500 MG PO TABS
1000.0000 mg | ORAL_TABLET | Freq: Once | ORAL | Status: AC
  Administered 2021-04-24: 1000 mg via ORAL
  Filled 2021-04-24 (×2): qty 2

## 2021-04-24 SURGICAL SUPPLY — 18 items
CATH 8FR REPROCESSED SOUNDSTAR (CATHETERS) ×3 IMPLANT
CATH 8FR SOUNDSTAR REPROCESSED (CATHETERS) IMPLANT
CATH OCTARAY 2.0 F 3-3-3-3-3 (CATHETERS) ×4 IMPLANT
CATH S CIRCA THERM PROBE 10F (CATHETERS) ×2 IMPLANT
CATH SMTCH THERMOCOOL SF DF (CATHETERS) ×2 IMPLANT
CATH WEBSTER BI DIR CS D-F CRV (CATHETERS) ×2 IMPLANT
CLOSURE PERCLOSE PROSTYLE (VASCULAR PRODUCTS) ×6 IMPLANT
COVER SWIFTLINK CONNECTOR (BAG) ×4 IMPLANT
PACK EP LATEX FREE (CUSTOM PROCEDURE TRAY) ×3
PACK EP LF (CUSTOM PROCEDURE TRAY) ×2 IMPLANT
PAD DEFIB RADIO PHYSIO CONN (PAD) ×4 IMPLANT
PATCH CARTO3 (PAD) ×2 IMPLANT
SHEATH BAYLIS TRANSSEPTAL 98CM (NEEDLE) ×2 IMPLANT
SHEATH CARTO VIZIGO SM CVD (SHEATH) ×2 IMPLANT
SHEATH PINNACLE 8F 10CM (SHEATH) ×4 IMPLANT
SHEATH PINNACLE 9F 10CM (SHEATH) ×2 IMPLANT
SHEATH PROBE COVER 6X72 (BAG) ×2 IMPLANT
TUBING SMART ABLATE COOLFLOW (TUBING) ×2 IMPLANT

## 2021-04-24 NOTE — Progress Notes (Signed)
Purewick placed for comfort, safety maintained

## 2021-04-24 NOTE — H&P (Signed)
Electrophysiology Office Follow up Visit Note:     Date:  04/24/2021    ID:  Brittney Fox, DOB 12-May-1940, MRN [id number]   PCP:  Velna Hatchet, MD     [facility] Cardiologist:  [name], MD  [facility] Electrophysiologist:  Vickie Epley, MD      Interval History:     Brittney Fox is a 81 y.o. female who presents for a follow up visit. They were last seen in clinic 12/22/2020 for her AF. She was previously trialed on flecainide but she did not tolerate this medication. She was also on metoprolol. She takes eliquis for stroke ppx.   She recently was on vacation to [place] and had recurrent episodes of AF. She presents today to discuss alternative rhythm control strategies.   She is feeling good now she is very active.  She is still working.  She is interested in a rhythm control strategy that avoids long-term exposure to antiarrhythmic drugs given her previous intolerance to flecainide.   She continues to experience symptomatic AF.   Objective          Past Medical History:  Diagnosis Date   Anal fissure      as a child   Blood transfusion without reported diagnosis     Diabetes mellitus      managed by diet and exercise   Diverticulosis of colon (without mention of hemorrhage)     Fatty liver     GERD (gastroesophageal reflux disease)     Heart murmur     Hyperlipidemia     Irritable bowel syndrome     Lymphocytic colitis      chronic   Neuromuscular disorder ([place])      neuropathy   Osteoarthritis     Osteopenia 09/2007   Other mucopurulent conjunctivitis     Premature ventricular contractions     Raynaud's syndrome     Rosacea      opitcal   Spinal stenosis             Past Surgical History:  Procedure Laterality Date   CATARACT EXTRACTION Bilateral     CHOLECYSTECTOMY       COLONOSCOPY       dental implants       DILATION AND CURETTAGE OF UTERUS   1990   FOOT SURGERY       HIP SURGERY Bilateral 1973   Jaw Implants    2007    2018.2019   KNEE ARTHROSCOPY Right     lens implant       ovarian wedge resection   1964   REPLACEMENT TOTAL KNEE Bilateral 2005, 2007   TOTAL ABDOMINAL HYSTERECTOMY W/ BILATERAL SALPINGOOPHORECTOMY   1990    Excessive Bleeding    TOTAL KNEE ARTHROPLASTY Left 08/2003   TOTAL KNEE ARTHROPLASTY Right 08/2005      Current Medications: Active Medications      Current Meds  Medication Sig   apixaban (ELIQUIS) 5 MG TABS tablet Take 1 tablet (5 mg total) by mouth 2 (two) times daily.   cephALEXin (KEFLEX) 500 MG capsule Take 500 mg by mouth 2 (two) times daily.   Cholecalciferol (VITAMIN D) 50 MCG (2000 UT) tablet Take 2,000 Units by mouth daily.   Cyanocobalamin (B-12 IJ) Inject as directed every 30 (thirty) days.   dicyclomine (BENTYL) 20 MG tablet TAKE ONE TABLET EVERY 6 HOURS AS NEEDED   diphenoxylate-atropine (LOMOTIL) 2.5-0.025 MG per tablet TAKE ONE (1) TABLET FOUR TIMES EACH  DAY AS NEEDED FOR DIARRHEA   FIBER PO Take by mouth.   hydrocortisone (ANUSOL-HC) 2.5 % rectal cream Place 1 application rectally at bedtime.   JARDIANCE 10 MG TABS tablet Take 1 tablet by mouth daily.   metoprolol succinate (TOPROL XL) 25 MG 24 hr tablet Take 1 tablet (25 mg total) by mouth daily.   naproxen sodium (ALEVE) 220 MG tablet Take 220 mg by mouth as directed. PRN   omeprazole (PRILOSEC) 40 MG capsule TAKE ONE CAPSULE EACH DAY   potassium chloride (KLOR-CON) 10 MEQ tablet Take 2 tablets (20 mEq total) by mouth daily.   Probiotic Product (ALIGN PO) Take by mouth.   Psyllium (METAMUCIL PO) Take by mouth daily.   Pyridoxine HCl (B-6 PO) Take by mouth.   spironolactone-hydrochlorothiazide (ALDACTAZIDE) 25-25 MG per tablet TAKE ONE TABLET EACH DAY        Allergies:   Amoxicillin, Codeine phosphate, and Morphine sulfate    Social History         Socioeconomic History   Marital status: Widowed      Spouse name: Not on file   Number of children: 2   Years of education: Not on file    Highest education level: Not on file  Occupational History   Occupation: REAL ESTATE      Employer: [employer]  Tobacco Use   Smoking status: Former      Types: Cigarettes      Quit date: 10/22/1961      Years since quitting: 59.3   Smokeless tobacco: Never  Vaping Use   Vaping Use: Never used  Substance and Sexual Activity   Alcohol use: No      Alcohol/week: 0.0 standard drinks   Drug use: No   Sexual activity: Not Currently      Partners: Male      Birth control/protection: Surgical      Comment: hysterectomy  Other Topics Concern   Not on file  Social History Narrative   Not on file    Social Determinants of Health    Financial Resource Strain: Not on file  Food Insecurity: Not on file  Transportation Needs: Not on file  Physical Activity: Not on file  Stress: Not on file  Social Connections: Not on file      Family History: The patient's family history includes Breast cancer (age of onset: 78) in her sister; Diabetes in her paternal uncle; Heart disease in her father; Lung cancer in her sister; Osteoporosis in her mother; Ovarian cancer in her maternal grandmother. There is no history of Colon cancer, Stomach cancer, Esophageal cancer, or Rectal cancer.   ROS:   Please see the history of present illness.    All other systems reviewed and are negative.   EKGs/Labs/Other Studies Reviewed:     The following studies were reviewed today:     EKG:  The ekg ordered today demonstrates sinus rhythm.  Low voltage QRS.   Recent Labs: 11/02/2020: BUN 18; Creatinine, Ser 1.02; Hemoglobin 16.5; Platelets 284; Potassium 3.7; Sodium 136  Recent Lipid Panel Labs (Brief)          Component Value Date/Time    CHOL 144 09/13/2015 0734    TRIG 87 09/13/2015 0734    TRIG 88 02/06/2006 1028    HDL 59 09/13/2015 0734    CHOLHDL 2.4 09/13/2015 0734    VLDL 17 09/13/2015 0734    LDLCALC 68 09/13/2015 0734    LDLDIRECT 150.2 11/28/2010 1131  Physical Exam:     VS:  BP 126/74    Pulse 62    Ht 5\' 5"  (1.651 m)    Wt 165 lb 6.4 oz (75 kg)    LMP 04/02/1988    SpO2 98%    BMI 27.52 kg/m         Wt Readings from Last 3 Encounters:  02/02/21 165 lb 6.4 oz (75 kg)  01/11/21 167 lb (75.8 kg)  12/28/20 162 lb 9.6 oz (73.8 kg)      GEN:  Well nourished, well developed in no acute distress.  Appears younger than stated age 37: Normal NECK: No JVD; No carotid bruits LYMPHATICS: No lymphadenopathy CARDIAC: RRR, no murmurs, rubs, gallops RESPIRATORY:  Clear to auscultation without rales, wheezing or rhonchi  ABDOMEN: Soft, non-tender, non-distended MUSCULOSKELETAL:  No edema; No deformity  SKIN: Warm and dry NEUROLOGIC:  Alert and oriented x 3 PSYCHIATRIC:  Normal affect            Assessment     ASSESSMENT:     1. Paroxysmal atrial fibrillation (HCC)   2. Essential hypertension     PLAN:     In order of problems listed above:   #Paroxysmal atrial fibrillation Symptomatic episodes seem to be more frequent.  She is previously not tolerated antiarrhythmic drugs.  She takes Eliquis for stroke prophylaxis.  I discussed rhythm control strategies including alternative antiarrhythmic drugs versus catheter ablation during today's visit.  We discussed the success rates and pros and cons of each strategy.  I do not think that she is likely to tolerate alternative antiarrhythmic drugs given her previous intolerance to flecainide.  She is a great candidate for catheter ablation.  We discussed the procedure in detail include the risk, recovery and likelihood of success.  She would like to proceed with scheduling.  Will need an echo and CT scan prior to the procedure.   Risk, benefits, and alternatives to EP study and radiofrequency ablation for afib were also discussed in detail today. These risks include but are not limited to stroke, bleeding, vascular damage, tamponade, perforation, damage to the esophagus, lungs, and other  structures, pulmonary vein stenosis, worsening renal function, and death. The patient understands these risk and wishes to proceed.  We will therefore proceed with catheter ablation at the next available time.  Carto, ICE, anesthesia are requested for the procedure.  Will also obtain CT PV protocol prior to the procedure to exclude LAA thrombus and further evaluate atrial anatomy.      --------------------------------  I have seen, examined the patient, and reviewed the above assessment and plan.    Plan for PVI today.   Vickie Epley, MD 04/24/2021 7:14 AM

## 2021-04-24 NOTE — Transfer of Care (Signed)
Immediate Anesthesia Transfer of Care Note  Patient: Brittney Fox  Procedure(s) Performed: ATRIAL FIBRILLATION ABLATION  Patient Location: PACU and Cath Lab  Anesthesia Type:General  Level of Consciousness: drowsy and patient cooperative  Airway & Oxygen Therapy: Patient Spontanous Breathing and Patient connected to nasal cannula oxygen  Post-op Assessment: Report given to RN and Post -op Vital signs reviewed and stable  Post vital signs: Reviewed and stable  Last Vitals:  Vitals Value Taken Time  BP 99/47 04/24/21 1050  Temp    Pulse 72 04/24/21 1051  Resp 16 04/24/21 1051  SpO2 95 % 04/24/21 1051  Vitals shown include unvalidated device data.  Last Pain:  Vitals:   04/24/21 7493  PainSc: 0-No pain         Complications: No notable events documented.

## 2021-04-24 NOTE — Anesthesia Postprocedure Evaluation (Signed)
Anesthesia Post Note  Patient: Brittney Fox  Procedure(s) Performed: ATRIAL FIBRILLATION ABLATION     Patient location during evaluation: PACU Anesthesia Type: General Level of consciousness: awake and alert, patient cooperative and oriented Pain management: pain level controlled Vital Signs Assessment: post-procedure vital signs reviewed and stable Respiratory status: spontaneous breathing, nonlabored ventilation and respiratory function stable Cardiovascular status: blood pressure returned to baseline and stable Postop Assessment: no apparent nausea or vomiting and adequate PO intake Anesthetic complications: no   No notable events documented.  Last Vitals:  Vitals:   04/24/21 1300 04/24/21 1330  BP: (!) 144/64 132/64  Pulse: 67 66  Resp: 15 12  Temp:    SpO2: 100% 100%    Last Pain:  Vitals:   04/24/21 1230  TempSrc:   PainSc: 6                  Annabelle Rexroad,E. Keyosha Tiedt

## 2021-04-24 NOTE — Progress Notes (Signed)
Client c/o IV site pain at needle insertion site where needle entered skin; good blood return and IV flowing without difficulty; changed tape at site and client stated felt better; client up to bathroom and c/o IV hurting; site swollen and IV d/c'ed

## 2021-04-24 NOTE — Discharge Instructions (Signed)

## 2021-04-24 NOTE — Anesthesia Procedure Notes (Signed)
Procedure Name: Intubation Date/Time: 04/24/2021 7:49 AM Performed by: Renato Shin, CRNA Pre-anesthesia Checklist: Patient identified, Emergency Drugs available, Suction available and Patient being monitored Patient Re-evaluated:Patient Re-evaluated prior to induction Oxygen Delivery Method: Circle system utilized Preoxygenation: Pre-oxygenation with 100% oxygen Induction Type: IV induction Ventilation: Mask ventilation without difficulty Laryngoscope Size: Miller and 2 Grade View: Grade I Tube type: Oral Tube size: 7.0 mm Number of attempts: 1 Airway Equipment and Method: Stylet and Oral airway Placement Confirmation: ETT inserted through vocal cords under direct vision, positive ETCO2 and breath sounds checked- equal and bilateral Secured at: 21 cm Tube secured with: Tape Dental Injury: Teeth and Oropharynx as per pre-operative assessment

## 2021-04-25 ENCOUNTER — Encounter (HOSPITAL_COMMUNITY): Payer: Self-pay | Admitting: Cardiology

## 2021-04-25 ENCOUNTER — Other Ambulatory Visit: Payer: Self-pay | Admitting: Cardiovascular Disease

## 2021-04-28 ENCOUNTER — Telehealth: Payer: Self-pay | Admitting: Cardiovascular Disease

## 2021-04-28 NOTE — Telephone Encounter (Signed)
PT reaching out for post op instructions as well as additional questions following her procedure... please advise

## 2021-04-28 NOTE — Telephone Encounter (Signed)
Returned the call to the patient. She had an ablation with Dr. Quentin Ore on 1/23. She was calling to get in detail instructions about her activity limitations. She stated that the site looked well and is healing fine. She does have minor pain when ambulating on the right side (in the groin area).  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 She also drives two hours plus to take care of her grandchildren. She would like to know if these activities are safe for her.   She stated that she wanted to make sure since the procedure took longer than expected.  Follow up appointments: 2/8 Dr. Burt Knack Afib clinic on 2/20

## 2021-05-01 NOTE — Telephone Encounter (Signed)
Patient has been made aware and verbalized her understanding:  Vickie Epley, MD  You; Darrell Jewel, RN; Damian Leavell, RN 3 days ago   She should be OK to resume normal activities now.  CL

## 2021-05-10 ENCOUNTER — Encounter: Payer: Self-pay | Admitting: Cardiovascular Disease

## 2021-05-10 ENCOUNTER — Ambulatory Visit (INDEPENDENT_AMBULATORY_CARE_PROVIDER_SITE_OTHER): Payer: Medicare Other | Admitting: Cardiovascular Disease

## 2021-05-10 ENCOUNTER — Other Ambulatory Visit: Payer: Self-pay

## 2021-05-10 VITALS — BP 114/82 | HR 100 | Ht 65.0 in | Wt 165.2 lb

## 2021-05-10 DIAGNOSIS — I4819 Other persistent atrial fibrillation: Secondary | ICD-10-CM | POA: Diagnosis not present

## 2021-05-10 DIAGNOSIS — I10 Essential (primary) hypertension: Secondary | ICD-10-CM | POA: Diagnosis not present

## 2021-05-10 MED ORDER — METOPROLOL SUCCINATE ER 50 MG PO TB24: 50.0000 mg | ORAL_TABLET | Freq: Every day | ORAL | 3 refills | Status: DC

## 2021-05-10 NOTE — Patient Instructions (Signed)
Medication Instructions:  Your physician recommends that you continue on your current medications as directed. Please refer to the Current Medication list given to you today.  *If you need a refill on your cardiac medications before your next appointment, please call your pharmacy*   Lab Work: NONE If you have labs (blood work) drawn today and your tests are completely normal, you will receive your results only by: [place] (if you have MyChart) OR A paper copy in the mail If you have any lab test that is abnormal or we need to change your treatment, we will call you to review the results.   Testing/Procedures: EKG today   Follow-Up: At [facility], you and your health needs are our priority.  As part of our continuing mission to provide you with exceptional heart care, we have created designated Provider Care Teams.  These Care Teams include your primary Cardiologist (physician) and Advanced Practice Providers (APPs -  Physician Assistants and Nurse Practitioners) who all work together to provide you with the care you need, when you need it.  Your next appointment:   6 month(s)  The format for your next appointment:   In Person  Provider:   [name], MD    Thank you for allowing [place] [facility] to serve you, God Bless!!

## 2021-05-10 NOTE — Progress Notes (Signed)
Cardiology Office Note:    Date:  05/10/2021   ID:  Brittney Fox, DOB 02/17/1941, MRN [id number]  PCP:  Velna Hatchet, MD   [facility] Providers Cardiologist:  [name], MD Electrophysiologist:  Vickie Epley, MD     Referring MD: Velna Hatchet, MD   Chief Complaint  Patient presents with   Atrial Fibrillation    History of Present Illness:    Brittney Fox is a 80 y.o. female with a hx of type 2 diabetes,  and persistent atrial fibrillation.  She was intolerant to flecainide.  She was referred for EP evaluation and ultimately was treated with atrial fib ablation a few weeks ago.  She feels like she has had a few brief episodes of atrial fibrillation since that time but overall is doing well.  She has no chest pain, shortness of breath, or changes in her chronic leg edema.  Denies lightheadedness, syncope, orthopnea, or PND.  Past Medical History:  Diagnosis Date   Anal fissure    as a child   Blood transfusion without reported diagnosis    Diabetes mellitus    managed by diet and exercise   Diverticulosis of colon (without mention of hemorrhage)    Fatty liver    GERD (gastroesophageal reflux disease)    Heart murmur    Hyperlipidemia    Irritable bowel syndrome    Lymphocytic colitis    chronic   Neuromuscular disorder ([place])    neuropathy   Osteoarthritis    Osteopenia 09/2007   Other mucopurulent conjunctivitis    Premature ventricular contractions    Raynaud's syndrome    Rosacea    opitcal   Spinal stenosis     Past Surgical History:  Procedure Laterality Date   ATRIAL FIBRILLATION ABLATION N/A 04/24/2021   Procedure: ATRIAL FIBRILLATION ABLATION;  Surgeon: Vickie Epley, MD;  Location: [facility];  Service: Cardiovascular;  Laterality: N/A;   CATARACT EXTRACTION Bilateral    CHOLECYSTECTOMY     COLONOSCOPY     dental implants     DILATION AND CURETTAGE OF UTERUS  1990   FOOT SURGERY     HIP SURGERY Bilateral  1973   Jaw Implants  2007   2018.2019   KNEE ARTHROSCOPY Right    lens implant     ovarian wedge resection  1964   REPLACEMENT TOTAL KNEE Bilateral 2005, 2007   TOTAL ABDOMINAL HYSTERECTOMY W/ BILATERAL SALPINGOOPHORECTOMY  1990   Excessive Bleeding    TOTAL KNEE ARTHROPLASTY Left 08/2003   TOTAL KNEE ARTHROPLASTY Right 08/2005    Current Medications: Current Meds  Medication Sig   acetaminophen (TYLENOL) 500 MG tablet Take 500 mg by mouth every 6 (six) hours as needed for moderate pain.   apixaban (ELIQUIS) 5 MG TABS tablet Take 1 tablet (5 mg total) by mouth 2 (two) times daily.   Cholecalciferol (VITAMIN D) 50 MCG (2000 UT) tablet Take 2,000 Units by mouth daily.   dicyclomine (BENTYL) 20 MG tablet TAKE ONE TABLET EVERY 6 HOURS AS NEEDED   diphenoxylate-atropine (LOMOTIL) 2.5-0.025 MG per tablet TAKE ONE (1) TABLET FOUR TIMES EACH DAY AS NEEDED FOR DIARRHEA (Patient taking differently: Take 1-2 tablets by mouth 4 (four) times daily as needed for diarrhea or loose stools.)   DM-APAP-CPM (CORICIDIN HBP PO) Take 1 tablet by mouth daily as needed (congestion).   glucose blood (ONETOUCH VERIO) test strip    JARDIANCE 10 MG TABS tablet Take 1 tablet by mouth daily.  loperamide (IMODIUM A-D) 2 MG tablet Take 2-4 mg by mouth 4 (four) times daily as needed for diarrhea or loose stools.   meclizine (ANTIVERT) 25 MG tablet    Melatonin 5 MG CAPS Take 5 mg by mouth at bedtime as needed (sleep).   metoprolol succinate (TOPROL XL) 25 MG 24 hr tablet Take 1 tablet (25 mg total) by mouth daily.   naproxen sodium (ALEVE) 220 MG tablet Take 220-440 mg by mouth daily as needed (pain).   omeprazole (PRILOSEC) 40 MG capsule TAKE ONE CAPSULE EACH DAY   OVER THE COUNTER MEDICATION Take 1 tablet by mouth daily. Total Restore otc supplement   Polyvinyl Alcohol-Povidone (REFRESH OP) Place 1 drop into both eyes daily as needed (dry eyes).   potassium chloride (KLOR-CON) 10 MEQ tablet TAKE TWO TABLETS EVERY  DAY   Probiotic Product (ALIGN PO) Take 1 capsule by mouth daily.   Probiotic Product (PROBIOTIC PO) Take 1 capsule by mouth daily. 24 Strain probiotic   Psyllium (METAMUCIL PO) Take 1 Dose by mouth daily as needed (constipation). 1 dose = 1 tablespoon   Pyridoxine HCl (B-6 PO) Take 1 tablet by mouth at bedtime.   spironolactone-hydrochlorothiazide (ALDACTAZIDE) 25-25 MG per tablet TAKE ONE TABLET EACH DAY   terconazole (TERAZOL 7) 0.4 % vaginal cream as needed.   vitamin B-12 (CYANOCOBALAMIN) 1000 MCG tablet Take 1,000 mcg by mouth daily.     Allergies:   Amoxicillin, Codeine phosphate, and Morphine sulfate   Social History   Socioeconomic History   Marital status: Widowed    Spouse name: Not on file   Number of children: 2   Years of education: Not on file   Highest education level: Not on file  Occupational History   Occupation: REAL ESTATE    Employer: [employer]  Tobacco Use   Smoking status: Former    Types: Cigarettes    Quit date: 10/22/1961    Years since quitting: 59.5   Smokeless tobacco: Never  Vaping Use   Vaping Use: Never used  Substance and Sexual Activity   Alcohol use: No    Alcohol/week: 0.0 standard drinks   Drug use: No   Sexual activity: Not Currently    Partners: Male    Birth control/protection: Surgical    Comment: hysterectomy  Other Topics Concern   Not on file  Social History Narrative   Not on file   Social Determinants of Health   Financial Resource Strain: Not on file  Food Insecurity: Not on file  Transportation Needs: Not on file  Physical Activity: Not on file  Stress: Not on file  Social Connections: Not on file     Family History: The patient's family history includes Breast cancer (age of onset: 58) in her sister; Diabetes in her paternal uncle; Heart disease in her father; Lung cancer in her sister; Osteoporosis in her mother; Ovarian cancer in her maternal grandmother. There is no history of Colon  cancer, Stomach cancer, Esophageal cancer, or Rectal cancer.  ROS:   Please see the history of present illness.    All other systems reviewed and are negative.  EKGs/Labs/Other Studies Reviewed:    The following studies were reviewed today: Echo 02/20/2021: 1. Left ventricular ejection fraction, by estimation, is 55 to 60%. The  left ventricle has normal function. The left ventricle has no regional  wall motion abnormalities. Left ventricular diastolic parameters were  normal. The average left ventricular  global longitudinal strain is -25.6 %. The global  longitudinal strain is  normal.   2. Right ventricular systolic function is normal. The right ventricular  size is normal.   3. Left atrial size was moderately dilated.   4. Likely PFO present . Evidence of atrial level shunting detected by  color flow Doppler.   5. Right atrial size was moderately dilated.   6. The mitral valve is abnormal. Trivial mitral valve regurgitation. No  evidence of mitral stenosis.   7. The aortic valve is tricuspid. There is mild calcification of the  aortic valve. Aortic valve regurgitation is not visualized. Aortic valve  sclerosis is present, with no evidence of aortic valve stenosis.   8. The inferior vena cava is normal in size with greater than 50%  respiratory variability, suggesting right atrial pressure of 3 mmHg.   Myocardial perfusion imaging 12/07/20:   The study is normal. The study is low risk.   LV perfusion is normal. There is no evidence of ischemia. There is no evidence of infarction.   Left ventricular function is normal. Nuclear stress EF: 55 %. The left ventricular ejection fraction is normal (55-65%). End diastolic cavity size is normal.   Prior study available for comparison from 02/19/2005. No changes compared to prior study. No change from previous study 2006   Normal resting and stress perfusion. No ischemia or infarction EF 55% no change from study done 2006  EKG:  EKG is  ordered today.  The ekg ordered today demonstrates sinus tachycardia 100 bpm  Recent Labs: 04/11/2021: BUN 17; Creatinine, Ser 0.70; Hemoglobin 16.8; Platelets 291; Potassium 3.7; Sodium 134  Recent Lipid Panel    Component Value Date/Time   CHOL 144 09/13/2015 0734   TRIG 87 09/13/2015 0734   TRIG 88 02/06/2006 1028   HDL 59 09/13/2015 0734   CHOLHDL 2.4 09/13/2015 0734   VLDL 17 09/13/2015 0734   LDLCALC 68 09/13/2015 0734   LDLDIRECT 150.2 11/28/2010 1131     Risk Assessment/Calculations:    CHA2DS2-VASc Score = 3   This indicates a 3.2% annual risk of stroke. The patient's score is based upon: CHF History: 0 HTN History: 0 Diabetes History: 0 Stroke History: 0 Vascular Disease History: 0 Age Score: 2 Gender Score: 1          Physical Exam:    VS:  BP 114/82    Pulse 100    Ht 5\' 5"  (1.651 m)    Wt 165 lb 3.2 oz (74.9 kg)    LMP 04/02/1988    SpO2 97%    BMI 27.49 kg/m     Wt Readings from Last 3 Encounters:  05/10/21 165 lb 3.2 oz (74.9 kg)  04/24/21 158 lb (71.7 kg)  02/02/21 165 lb 6.4 oz (75 kg)     GEN:  Well nourished, well developed in no acute distress HEENT: Normal NECK: No JVD; No carotid bruits LYMPHATICS: No lymphadenopathy CARDIAC: tachycardic and regular, no murmurs, rubs, gallops RESPIRATORY:  Clear to auscultation without rales, wheezing or rhonchi  ABDOMEN: Soft, non-tender, non-distended MUSCULOSKELETAL:  No edema; No deformity  SKIN: Warm and dry NEUROLOGIC:  Alert and oriented x 3 PSYCHIATRIC:  Normal affect   ASSESSMENT:    1. Persistent atrial fibrillation ([place])   2. Essential hypertension   3. Chronic diastolic heart failure (HCC)    PLAN:    In order of problems listed above:  2 weeks out from A-fib ablation.  Tolerating oral anticoagulation with apixaban.  Reviewed her EKG with Dr Quentin Ore as I was  concerned she may be in 2:1 atrial flutter, but after review it appears to be sinus tach which is common early after AF  ablation.  She has a follow-up appointment with the atrial fibrillation clinic in a few weeks.  We will increase her metoprolol to 50 mg daily.  No other changes are made in her medical regimen. Blood pressure controlled on current therapy.  She is treated with spironolactone, hydrochlorothiazide.           Medication Adjustments/Labs and Tests Ordered: Current medicines are reviewed at length with the patient today.  Concerns regarding medicines are outlined above.  Orders Placed This Encounter  Procedures   EKG 12-Lead   No orders of the defined types were placed in this encounter.   Patient Instructions  Medication Instructions:  Your physician recommends that you continue on your current medications as directed. Please refer to the Current Medication list given to you today.  *If you need a refill on your cardiac medications before your next appointment, please call your pharmacy*   Lab Work: NONE If you have labs (blood work) drawn today and your tests are completely normal, you will receive your results only by: [place] (if you have MyChart) OR A paper copy in the mail If you have any lab test that is abnormal or we need to change your treatment, we will call you to review the results.   Testing/Procedures: EKG today   Follow-Up: At [facility], you and your health needs are our priority.  As part of our continuing mission to provide you with exceptional heart care, we have created designated Provider Care Teams.  These Care Teams include your primary Cardiologist (physician) and Advanced Practice Providers (APPs -  Physician Assistants and Nurse Practitioners) who all work together to provide you with the care you need, when you need it.  Your next appointment:   6 month(s)  The format for your next appointment:   In Person  Provider:   [name], MD    Thank you for allowing [place] [facility] to serve you, God Bless!!     Signed, [name], MD  05/10/2021 2:15 PM    [place]

## 2021-05-22 ENCOUNTER — Ambulatory Visit (HOSPITAL_COMMUNITY): Payer: Medicare Other | Admitting: Physician Assistant

## 2021-05-23 ENCOUNTER — Ambulatory Visit (HOSPITAL_COMMUNITY)
Admission: RE | Admit: 2021-05-23 | Discharge: 2021-05-23 | Disposition: A | Discharge status: Home / Self Care | Payer: Medicare Other | Source: Ambulatory Visit | Attending: Nurse Practitioner | Admitting: Nurse Practitioner

## 2021-05-23 ENCOUNTER — Encounter (HOSPITAL_COMMUNITY): Payer: Self-pay | Admitting: Nurse Practitioner

## 2021-05-23 ENCOUNTER — Other Ambulatory Visit: Payer: Self-pay

## 2021-05-23 VITALS — BP 144/72 | HR 71 | Ht 65.0 in | Wt 167.0 lb

## 2021-05-23 DIAGNOSIS — I4819 Other persistent atrial fibrillation: Secondary | ICD-10-CM

## 2021-05-23 DIAGNOSIS — Z7901 Long term (current) use of anticoagulants: Secondary | ICD-10-CM | POA: Diagnosis not present

## 2021-05-23 DIAGNOSIS — D6869 Other thrombophilia: Secondary | ICD-10-CM

## 2021-05-23 DIAGNOSIS — I480 Paroxysmal atrial fibrillation: Secondary | ICD-10-CM | POA: Insufficient documentation

## 2021-05-23 NOTE — Progress Notes (Addendum)
Primary Care Physician: Velna Hatchet, MD Referring Physician: Dr. Algis Greenhouse Brittney Fox is a 81 y.o. female with a h/o paroxysmal afib that had afib ablation one month ago, she is in SR today. She saw Dr. Burt Knack early February and she was in [place] but with v rates near 100 bpm. He started metoprolol succinate 50 mg daily. This has helped curb her v rates so today she is in SR at 71 bpm. No swallowing or groin issues.   Today, she denies symptoms of palpitations, chest pain, shortness of breath, orthopnea, PND, lower extremity edema, dizziness, presyncope, syncope, or neurologic sequela. The patient is tolerating medications without difficulties and is otherwise without complaint today.   Past Medical History:  Diagnosis Date   Anal fissure    as a child   Blood transfusion without reported diagnosis    Diabetes mellitus    managed by diet and exercise   Diverticulosis of colon (without mention of hemorrhage)    Fatty liver    GERD (gastroesophageal reflux disease)    Heart murmur    Hyperlipidemia    Irritable bowel syndrome    Lymphocytic colitis    chronic   Neuromuscular disorder ([place])    neuropathy   Osteoarthritis    Osteopenia 09/2007   Other mucopurulent conjunctivitis    Premature ventricular contractions    Raynaud's syndrome    Rosacea    opitcal   Spinal stenosis    Past Surgical History:  Procedure Laterality Date   ATRIAL FIBRILLATION ABLATION N/A 04/24/2021   Procedure: ATRIAL FIBRILLATION ABLATION;  Surgeon: Vickie Epley, MD;  Location: [facility];  Service: Cardiovascular;  Laterality: N/A;   CATARACT EXTRACTION Bilateral    CHOLECYSTECTOMY     COLONOSCOPY     dental implants     DILATION AND CURETTAGE OF UTERUS  1990   FOOT SURGERY     HIP SURGERY Bilateral 1973   Jaw Implants  2007   2018.2019   KNEE ARTHROSCOPY Right    lens implant     ovarian wedge resection  1964   REPLACEMENT TOTAL KNEE Bilateral 2005, 2007   TOTAL  ABDOMINAL HYSTERECTOMY W/ BILATERAL SALPINGOOPHORECTOMY  1990   Excessive Bleeding    TOTAL KNEE ARTHROPLASTY Left 08/2003   TOTAL KNEE ARTHROPLASTY Right 08/2005    Current Outpatient Medications  Medication Sig Dispense Refill   acetaminophen (TYLENOL) 500 MG tablet Take 500 mg by mouth every 6 (six) hours as needed for moderate pain.     apixaban (ELIQUIS) 5 MG TABS tablet Take 1 tablet (5 mg total) by mouth 2 (two) times daily. 60 tablet 5   Cholecalciferol (VITAMIN D) 50 MCG (2000 UT) tablet Take 2,000 Units by mouth daily.     dicyclomine (BENTYL) 20 MG tablet TAKE ONE TABLET EVERY 6 HOURS AS NEEDED 90 tablet 0   diphenoxylate-atropine (LOMOTIL) 2.5-0.025 MG per tablet TAKE ONE (1) TABLET FOUR TIMES EACH DAY AS NEEDED FOR DIARRHEA (Patient taking differently: Take 1-2 tablets by mouth 4 (four) times daily as needed for diarrhea or loose stools.) 30 tablet 0   glucose blood (ONETOUCH VERIO) test strip      JARDIANCE 10 MG TABS tablet Take 1 tablet by mouth daily.     loperamide (IMODIUM A-D) 2 MG tablet Take 2-4 mg by mouth 4 (four) times daily as needed for diarrhea or loose stools.     meclizine (ANTIVERT) 25 MG tablet Take 25 mg by mouth as  needed.     Melatonin 5 MG CAPS Take 5 mg by mouth at bedtime as needed (sleep).     metoprolol succinate (TOPROL-XL) 50 MG 24 hr tablet Take 1 tablet (50 mg total) by mouth daily. Take with or immediately following a meal. 90 tablet 3   naproxen sodium (ALEVE) 220 MG tablet Take 220-440 mg by mouth daily as needed (pain).     omeprazole (PRILOSEC) 40 MG capsule TAKE ONE CAPSULE EACH DAY 90 capsule 3   OVER THE COUNTER MEDICATION Take 1 tablet by mouth daily. Total Restore otc supplement     Polyvinyl Alcohol-Povidone (REFRESH OP) Place 1 drop into both eyes daily as needed (dry eyes).     potassium chloride (KLOR-CON) 10 MEQ tablet TAKE TWO TABLETS EVERY DAY 180 tablet 0   Probiotic Product (ALIGN PO) Take 1 capsule by mouth daily.     Probiotic  Product (PROBIOTIC PO) Take 1 capsule by mouth daily. 24 Strain probiotic     Psyllium (METAMUCIL PO) Take 1 Dose by mouth daily as needed (constipation). 1 dose = 1 tablespoon     Pyridoxine HCl (B-6 PO) Take 1 tablet by mouth at bedtime.     spironolactone-hydrochlorothiazide (ALDACTAZIDE) 25-25 MG per tablet TAKE ONE TABLET EACH DAY 30 tablet 5   terconazole (TERAZOL 7) 0.4 % vaginal cream as needed.     vitamin B-12 (CYANOCOBALAMIN) 1000 MCG tablet Take 1,000 mcg by mouth daily.     fluconazole (DIFLUCAN) 100 MG tablet Take 100 mg by mouth every other day. (Patient not taking: Reported on 05/23/2021)     hydrocortisone (ANUSOL-HC) 2.5 % rectal cream Place 1 application rectally at bedtime. (Patient not taking: Reported on 05/10/2021) 30 g 1   No current facility-administered medications for this encounter.    Allergies  Allergen Reactions   Amoxicillin Other (See Comments)    Ulcers and thrush   Codeine Phosphate     headache   Morphine Sulfate Swelling    tongue swelling    Social History   Socioeconomic History   Marital status: Widowed    Spouse name: Not on file   Number of children: 2   Years of education: Not on file   Highest education level: Not on file  Occupational History   Occupation: REAL ESTATE    Employer: [employer]  Tobacco Use   Smoking status: Former    Types: Cigarettes    Quit date: 10/22/1961    Years since quitting: 59.6   Smokeless tobacco: Never  Vaping Use   Vaping Use: Never used  Substance and Sexual Activity   Alcohol use: No    Alcohol/week: 0.0 standard drinks   Drug use: No   Sexual activity: Not Currently    Partners: Male    Birth control/protection: Surgical    Comment: hysterectomy  Other Topics Concern   Not on file  Social History Narrative   Not on file   Social Determinants of Health   Financial Resource Strain: Not on file  Food Insecurity: Not on file  Transportation Needs: Not on file  Physical  Activity: Not on file  Stress: Not on file  Social Connections: Not on file  Intimate Partner Violence: Not on file    Family History  Problem Relation Age of Onset   Lung cancer Sister    Osteoporosis Mother    Heart disease Father    Diabetes Paternal Uncle    Ovarian cancer Maternal Grandmother    Breast  cancer Sister 71   Colon cancer Neg Hx    Stomach cancer Neg Hx    Esophageal cancer Neg Hx    Rectal cancer Neg Hx     ROS- All systems are reviewed and negative except as per the HPI above  Physical Exam: Vitals:   05/23/21 1348  BP: (!) 144/72  Pulse: 71  Weight: 75.8 kg  Height: 5\' 5"  (1.651 m)   Wt Readings from Last 3 Encounters:  05/23/21 75.8 kg  05/10/21 74.9 kg  04/24/21 71.7 kg    Labs: Lab Results  Component Value Date   NA 134 04/11/2021   K 3.7 04/11/2021   CL 96 04/11/2021   CO2 22 04/11/2021   GLUCOSE 119 (H) 04/11/2021   BUN 17 04/11/2021   CREATININE 0.70 04/11/2021   CALCIUM 9.8 04/11/2021   MG 1.7 07/10/2017   No results found for: INR Lab Results  Component Value Date   CHOL 144 09/13/2015   HDL 59 09/13/2015   LDLCALC 68 09/13/2015   TRIG 87 09/13/2015     GEN- The patient is well appearing, alert and oriented x 3 today.   Head- normocephalic, atraumatic Eyes-  Sclera clear, conjunctiva pink Ears- hearing intact Oropharynx- clear Neck- supple, no JVP Lymph- no cervical lymphadenopathy Lungs- Clear to ausculation bilaterally, normal work of breathing Heart- Regular rate and rhythm, no murmurs, rubs or gallops, PMI not laterally displaced GI- soft, NT, ND, + BS Extremities- no clubbing, cyanosis, or edema MS- no significant deformity or atrophy Skin- no rash or lesion Psych- euthymic mood, full affect Neuro- strength and sensation are intact  EKG-SR with PAC's, at 71 bpm, pr int 170 ms, qrs int 104 ms, qtc 484 ms     Assessment and Plan:  1. Afib  S/p ablation one month ago  Staying in SR Continue metoprolol 50  mg daily   2. CHA2DS2VASc  score of at least 4 Reminded not to miss any doses for the full 3 months following the procedure   F/u with Dr. Quentin Ore  07/2121   Geroge Baseman. Awad Gladd, [facility] [address] [place], [place] [address] [phone]

## 2021-05-27 ENCOUNTER — Other Ambulatory Visit: Payer: Self-pay | Admitting: Internal Medicine

## 2021-05-27 ENCOUNTER — Other Ambulatory Visit: Payer: Self-pay | Admitting: Cardiovascular Disease

## 2021-05-27 DIAGNOSIS — K219 Gastro-esophageal reflux disease without esophagitis: Secondary | ICD-10-CM

## 2021-05-29 NOTE — Telephone Encounter (Signed)
Eliquis 5 mg refill request received. Patient is 81 years old, weight- 75.8 kg, Crea- 0.7  on 04/11/21, Diagnosis- afib, and last seen by Roderic Palau, NP on 05/23/21. Dose is appropriate based on dosing criteria. Will send in refill to requested pharmacy.

## 2021-05-30 ENCOUNTER — Emergency Department (HOSPITAL_COMMUNITY)
Admission: EM | Admit: 2021-05-30 | Discharge: 2021-05-31 | Disposition: A | Discharge status: Home / Self Care | Payer: Medicare Other | Attending: Emergency Medicine | Admitting: Emergency Medicine

## 2021-05-30 ENCOUNTER — Emergency Department (HOSPITAL_COMMUNITY): Payer: Medicare Other

## 2021-05-30 DIAGNOSIS — Z7901 Long term (current) use of anticoagulants: Secondary | ICD-10-CM | POA: Diagnosis not present

## 2021-05-30 DIAGNOSIS — E119 Type 2 diabetes mellitus without complications: Secondary | ICD-10-CM | POA: Insufficient documentation

## 2021-05-30 DIAGNOSIS — Z79899 Other long term (current) drug therapy: Secondary | ICD-10-CM | POA: Diagnosis not present

## 2021-05-30 DIAGNOSIS — R079 Chest pain, unspecified: Secondary | ICD-10-CM | POA: Diagnosis not present

## 2021-05-30 DIAGNOSIS — M8580 Other specified disorders of bone density and structure, unspecified site: Secondary | ICD-10-CM | POA: Diagnosis not present

## 2021-05-30 DIAGNOSIS — J9811 Atelectasis: Secondary | ICD-10-CM | POA: Diagnosis not present

## 2021-05-30 DIAGNOSIS — E785 Hyperlipidemia, unspecified: Secondary | ICD-10-CM | POA: Diagnosis not present

## 2021-05-30 DIAGNOSIS — I10 Essential (primary) hypertension: Secondary | ICD-10-CM | POA: Diagnosis not present

## 2021-05-30 DIAGNOSIS — R0789 Other chest pain: Secondary | ICD-10-CM | POA: Diagnosis not present

## 2021-05-30 DIAGNOSIS — E1169 Type 2 diabetes mellitus with other specified complication: Secondary | ICD-10-CM | POA: Diagnosis not present

## 2021-05-30 LAB — CBC
HCT: 49.4 % — ABNORMAL HIGH (ref 36.0–46.0)
Hemoglobin: 15.6 g/dL — ABNORMAL HIGH (ref 12.0–15.0)
MCH: 27.4 pg (ref 26.0–34.0)
MCHC: 31.6 g/dL (ref 30.0–36.0)
MCV: 86.7 fL (ref 80.0–100.0)
Platelets: 262 10*3/uL (ref 150–400)
RBC: 5.7 MIL/uL — ABNORMAL HIGH (ref 3.87–5.11)
RDW: 14.5 % (ref 11.5–15.5)
WBC: 9.4 10*3/uL (ref 4.0–10.5)
nRBC: 0 % (ref 0.0–0.2)

## 2021-05-30 LAB — BASIC METABOLIC PANEL
Anion gap: 10 (ref 5–15)
BUN: 17 mg/dL (ref 8–23)
CO2: 28 mmol/L (ref 22–32)
Calcium: 9.2 mg/dL (ref 8.9–10.3)
Chloride: 98 mmol/L (ref 98–111)
Creatinine, Ser: 0.71 mg/dL (ref 0.44–1.00)
GFR, Estimated: >60 mL/min (ref >60)
Glucose, Bld: 127 mg/dL — ABNORMAL HIGH (ref 70–99)
Potassium: 3.6 mmol/L (ref 3.5–5.1)
Sodium: 136 mmol/L (ref 135–145)

## 2021-05-30 LAB — TROPONIN I (HIGH SENSITIVITY): Troponin I (High Sensitivity): 7 ng/L (ref <18)

## 2021-05-30 MED ORDER — HYDROCODONE-ACETAMINOPHEN 5-325 MG PO TABS
1.0000 | ORAL_TABLET | Freq: Once | ORAL | Status: AC
  Administered 2021-05-31: 1 via ORAL
  Filled 2021-05-30: qty 1

## 2021-05-30 NOTE — ED Triage Notes (Signed)
Pt from home via [facility] c/o sharp CP earlier today, worse w deep breath. Denies radiation. Hx ablation for afib, on thinners. 324ASA PTA, , -NTG. 20LAC  130/100 HR 90, afib

## 2021-05-30 NOTE — ED Provider Triage Note (Signed)
Emergency Medicine Provider Triage Evaluation Note  Brittney Fox , a 81 y.o. female  was evaluated in triage.  Pt complains of chest pain.  Pain started this morning as a heaviness in the middle of her chest, she reports that it was very mild and she did not think much of it.  Went about her work today, but in the evening she started noticing a more severe right-sided chest pain that was worse when taking a deep breath.  Patient reports pain is also worse with movement.  No associated cough or fever.  History of A-fib with recent ablation, on Eliquis and reports compliance.  Review of Systems  Positive: Chest pain Negative: Fever, cough, syncope, abdominal pain, nausea, vomiting  Physical Exam  BP 114/70    Pulse 94    Temp 98.9 F (37.2 C)    Resp 16    LMP 04/02/1988    SpO2 98%  Gen:   Awake, no distress   Resp:  Normal effort, CTA bilat, tenderness over the left lateral chest, no overlying skin changes. MSK:   Moves extremities without difficulty  Other:    Medical Decision Making  Medically screening exam initiated at 10:45 PM.  Appropriate orders placed.  Brittney Fox was informed that the remainder of the evaluation will be completed by another provider, this initial triage assessment does not replace that evaluation, and the importance of remaining in the ED until their evaluation is complete.     Brittney Fox, [place] 05/30/21 2258

## 2021-05-31 DIAGNOSIS — R0789 Other chest pain: Secondary | ICD-10-CM | POA: Diagnosis not present

## 2021-05-31 LAB — TROPONIN I (HIGH SENSITIVITY): Troponin I (High Sensitivity): 8 ng/L (ref <18)

## 2021-05-31 MED ORDER — LIDOCAINE 5 % EX PTCH
1.0000 | MEDICATED_PATCH | Freq: Once | CUTANEOUS | Status: DC
  Administered 2021-05-31: 1 via TRANSDERMAL
  Filled 2021-05-31 (×2): qty 1

## 2021-05-31 MED ORDER — ACETAMINOPHEN 325 MG PO TABS
650.0000 mg | ORAL_TABLET | Freq: Once | ORAL | Status: AC
Start: 2021-05-31 — End: 2021-05-31
  Administered 2021-05-31: 650 mg via ORAL
  Filled 2021-05-31: qty 2

## 2021-05-31 NOTE — ED Provider Notes (Signed)
[facility] EMERGENCY DEPARTMENT Provider Note   CSN: [id number] Arrival date & time: 05/30/21  2216     History  Chief Complaint  Patient presents with   Chest Pain    Brittney Fox is a 81 y.o. female.  Brittney Fox is a 81 y.o. female with history of A-fib, GERD, hyperlipidemia, diabetes, Raynaud's, IBS, presents to the emergency department for evaluation of chest pain. Pain started this morning as a heaviness in the middle of her chest, she reports that it was very mild and she did not think much of it.  Went about her work today, but in the evening she started noticing a more severe left-sided chest pain that was worse when taking a deep breath.  Patient reports pain is also worse with movement.  No associated cough or fever.  History of A-fib with recent ablation, on Eliquis and reports compliance.  No lightheadedness or syncope.  Texted her cardiologist, Dr. Burt Knack who recommended she get evaluated.  The history is provided by the patient, medical records and a relative.      Home Medications Prior to Admission medications   Medication Sig Start Date End Date Taking? Authorizing Provider  acetaminophen (TYLENOL) 500 MG tablet Take 500 mg by mouth every 6 (six) hours as needed for moderate pain.    [provider]  Cholecalciferol (VITAMIN D) 50 MCG (2000 UT) tablet Take 2,000 Units by mouth daily.    [provider]  dicyclomine (BENTYL) 20 MG tablet TAKE ONE TABLET EVERY 6 HOURS AS NEEDED 10/10/20   Irene Shipper, MD  diphenoxylate-atropine (LOMOTIL) 2.5-0.025 MG per tablet TAKE ONE (1) TABLET FOUR TIMES EACH DAY AS NEEDED FOR DIARRHEA Patient taking differently: Take 1-2 tablets by mouth 4 (four) times daily as needed for diarrhea or loose stools. 12/14/13   Lafayette Dragon, MD  ELIQUIS 5 MG TABS tablet TAKE ONE TABLET TWICE DAILY 05/29/21   Burnell Blanks, MD  fluconazole (DIFLUCAN) 100 MG tablet Take 100 mg by mouth  every other day. Patient not taking: Reported on 05/23/2021 01/15/21   [provider]  glucose blood (ONETOUCH VERIO) test strip  04/27/15   [provider]  hydrocortisone (ANUSOL-HC) 2.5 % rectal cream Place 1 application rectally at bedtime. Patient not taking: Reported on 05/10/2021 01/11/21   Irene Shipper, MD  JARDIANCE 10 MG TABS tablet Take 1 tablet by mouth daily. 10/23/18   [provider]  loperamide (IMODIUM A-D) 2 MG tablet Take 2-4 mg by mouth 4 (four) times daily as needed for diarrhea or loose stools.    [provider]  meclizine (ANTIVERT) 25 MG tablet Take 25 mg by mouth as needed. 07/15/17   [provider]  Melatonin 5 MG CAPS Take 5 mg by mouth at bedtime as needed (sleep).    [provider]  metoprolol succinate (TOPROL-XL) 50 MG 24 hr tablet Take 1 tablet (50 mg total) by mouth daily. Take with or immediately following a meal. 05/10/21   [name], MD  naproxen sodium (ALEVE) 220 MG tablet Take 220-440 mg by mouth daily as needed (pain).    [provider]  omeprazole (PRILOSEC) 40 MG capsule TAKE ONE CAPSULE EACH DAY 05/29/21   Irene Shipper, MD  OVER THE COUNTER MEDICATION Take 1 tablet by mouth daily. Total Restore otc supplement    [provider]  Polyvinyl Alcohol-Povidone (REFRESH OP) Place 1 drop into both eyes daily as needed (dry eyes).  [provider]  potassium chloride (KLOR-CON) 10 MEQ tablet TAKE TWO TABLETS EVERY DAY 04/25/21   [name], MD  Probiotic Product (ALIGN PO) Take 1 capsule by mouth daily.    [provider]  Probiotic Product (PROBIOTIC PO) Take 1 capsule by mouth daily. 24 Strain probiotic    [provider]  Psyllium (METAMUCIL PO) Take 1 Dose by mouth daily as needed (constipation). 1 dose = 1 tablespoon    [provider]  Pyridoxine HCl (B-6 PO) Take 1 tablet by mouth at bedtime.    [provider]   spironolactone-hydrochlorothiazide (ALDACTAZIDE) 25-25 MG per tablet TAKE ONE TABLET EACH DAY 04/23/13   Swords, Darrick Penna, MD  terconazole (TERAZOL 7) 0.4 % vaginal cream as needed.    [provider]  vitamin B-12 (CYANOCOBALAMIN) 1000 MCG tablet Take 1,000 mcg by mouth daily.    [provider]      Allergies    Amoxicillin, Codeine phosphate, and Morphine sulfate    Review of Systems   Review of Systems  Constitutional:  Negative for chills and fever.  HENT: Negative.    Respiratory:  Negative for cough and shortness of breath.   Cardiovascular:  Positive for chest pain. Negative for palpitations and leg swelling.  Gastrointestinal:  Negative for abdominal pain, nausea and vomiting.  Genitourinary:  Negative for flank pain.  Musculoskeletal:  Negative for arthralgias and myalgias.  Skin:  Negative for color change and rash.  Neurological:  Negative for dizziness, syncope, weakness, light-headedness and numbness.  All other systems reviewed and are negative.  Physical Exam Updated Vital Signs BP 129/82 (BP Location: Left Arm)    Pulse 95    Temp (!) 97.5 F (36.4 C) (Oral)    Resp 17    LMP 04/02/1988    SpO2 98%  Physical Exam Vitals and nursing note reviewed.  Constitutional:      General: She is not in acute distress.    Appearance: Normal appearance. She is well-developed. She is not ill-appearing or diaphoretic.  HENT:     Head: Normocephalic and atraumatic.  Eyes:     General:        Right eye: No discharge.        Left eye: No discharge.     Pupils: Pupils are equal, round, and reactive to light.  Cardiovascular:     Rate and Rhythm: Normal rate and regular rhythm.     Pulses: Normal pulses.          Radial pulses are 2+ on the right side and 2+ on the left side.     Heart sounds: Normal heart sounds. No murmur heard.   No gallop.  Pulmonary:     Effort: Pulmonary effort is normal. No respiratory distress.     Breath sounds: Normal breath  sounds. No wheezing or rales.     Comments: Respirations equal and unlabored, patient able to speak in full sentences, lungs clear to auscultation bilaterally  Chest:     Chest wall: Tenderness present.     Comments: Tenderness over the left lower chest, without palpable deformity or overlying rash or skin changes Abdominal:     General: Bowel sounds are normal. There is no distension.     Palpations: Abdomen is soft. There is no mass.     Tenderness: There is no abdominal tenderness. There is no guarding.     Comments: Abdomen soft, nondistended, nontender to palpation in all quadrants without guarding or peritoneal signs  Musculoskeletal:        General: No deformity.     Cervical back: Neck supple.     Right lower leg: No tenderness. No edema.     Left lower leg: No tenderness. No edema.  Skin:    General: Skin is warm and dry.     Capillary Refill: Capillary refill takes less than 2 seconds.  Neurological:     Mental Status: She is alert and oriented to person, place, and time.     Coordination: Coordination normal.     Comments: Speech is clear, able to follow commands Moves extremities without ataxia, coordination intact  Psychiatric:        Mood and Affect: Mood normal.        Behavior: Behavior normal.    ED Results / Procedures / Treatments   Labs (all labs ordered are listed, but only abnormal results are displayed) Labs Reviewed  BASIC METABOLIC PANEL - Abnormal; Notable for the following components:      Result Value   Glucose, Bld 127 (*)    All other components within normal limits  CBC - Abnormal; Notable for the following components:   RBC 5.70 (*)    Hemoglobin 15.6 (*)    HCT 49.4 (*)    All other components within normal limits  TROPONIN I (HIGH SENSITIVITY)  TROPONIN I (HIGH SENSITIVITY)    EKG EKG Interpretation  Date/Time:  Tuesday May 30 2021 22:24:45 EST Ventricular Rate:  92 PR Interval:  224 QRS Duration: 112 QT Interval:  402 QTC  Calculation: 683 R Axis:   -47 Text Interpretation: Sinus rhythm with 1st degree A-V block Pulmonary disease pattern Left anterior fascicular block Minimal voltage criteria for LVH, may be normal variant ( Cornell product ) Nonspecific ST and T wave abnormality Prolonged QT Abnormal ECG When compared with ECG of 23-May-2021 14:05, PREVIOUS ECG IS PRESENT Confirmed by Addison Lank [phone]) on 05/31/2021 4:57:30 AM  Radiology DG Chest 2 View  Result Date: 05/30/2021 CLINICAL DATA:  Chest pain EXAM: CHEST - 2 VIEW COMPARISON:  Chest x-ray 11/06/2018, CT heart 04/21/2021 FINDINGS: Chest The heart and mediastinal contours are unchanged. Aortic calcification. Azygous fissure. Left base atelectasis. No focal consolidation. No pulmonary edema. No pleural effusion. No pneumothorax. No acute osseous abnormality. IMPRESSION: No active cardiopulmonary disease. Electronically Signed   By: Iven Finn M.D.   On: 05/30/2021 23:10    Procedures Procedures    Medications Ordered in ED Medications  lidocaine (LIDODERM) 5 % 1 patch (has no administration in time range)  acetaminophen (TYLENOL) tablet 650 mg (has no administration in time range)  HYDROcodone-acetaminophen (NORCO/VICODIN) 5-325 MG per tablet 1 tablet (1 tablet Oral Given 05/31/21 0029)    ED Course/ Medical Decision Making/ A&P                           This patient presents to the ED for concern of chest pain, this involves an extensive number of treatment options, and is a complaint that carries with it a high risk of complications and morbidity.  The differential diagnosis includes ACS, PE, aortic dissection, pneumothorax, pneumonia, musculoskeletal pain, GERD, anxiety  On arrival patient with normal vitals.  On exam she does have tenderness over the left lower chest that reproduces pain, no overlying skin changes or rash to suggest shingles.  No evidence of trauma to the area.   Co morbidities that complicate the patient  evaluation  A-fib, hyperlipidemia,  diabetes   Additional history obtained:  Additional history obtained from patient children External records from outside source obtained and reviewed including cardiology follow-up with Dr. Burt Knack   Lab Tests:  I Ordered, and personally interpreted labs.  The pertinent results include: No leukocytosis, slightly elevated hemoglobin, glucose 127 but no other electrolyte derangements and normal renal function.  Troponin negative x2   Imaging Studies ordered:  I ordered imaging studies including chest x-ray I independently visualized and interpreted imaging which showed no evidence of pneumonia or pneumothorax, no other active cardiopulmonary disease I agree with the radiologist interpretation   Cardiac Monitoring:  The patient was maintained on a cardiac monitor.  I personally viewed and interpreted the cardiac monitored which showed an underlying rhythm of: Normal sinus rhythm   Medicines ordered and prescription drug management:  I ordered medication including hydrocodone, Lidoderm patch and Tylenol for pain management Reevaluation of the patient after these medicines showed that the patient improved I have reviewed the patients home medicines and have made adjustments as needed   Test Considered:  CT angio chest to evaluate for PE but given that patient is already on Eliquis and has been compliant with this I have very low suspicion for PE   Problem List / ED Course:  Left-sided chest pain worse with breathing and movement, reproducible on exam.  Fortunately work-up here today has been very reassuring.  EKG without ischemic changes, troponins negative, chest x-ray clear Patient had some improvement in pain with symptomatic treatment here in the ED.  Discussed that this is likely musculoskeletal in nature and recommend continued supportive care and outpatient follow-up with PCP and cardiologist   Reevaluation:  After the interventions  noted above, I reevaluated the patient and found that they have :improved   Dispostion:  After consideration of the diagnostic results and the patients response to treatment, I feel that the patent would benefit from discharge home, given reassuring work-up do not feel she will require admission.          Final Clinical Impression(s) / ED Diagnoses Final diagnoses:  Left-sided chest pain    Rx / DC Orders ED Discharge Orders     None         Janet Berlin 05/31/21 [place], MD 05/31/21 [phone]

## 2021-05-31 NOTE — Discharge Instructions (Addendum)
Your evaluation today has been reassuring and lab work and EKG does not suggest that this pain is due to a problem with your heart, given that you are already on blood thinners we have low suspicion for a blood clot.  Suspect this pain is most likely musculoskeletal.  Please take Tylenol 1000 mg every 6 hours and use Lidoderm patches every 12 hours, these can be purchased over-the-counter.  You can also apply ice and heat to the area.  Follow-up with your regular doctor and cardiologist for reevaluation and return for new or worsening symptoms. ?

## 2021-06-05 ENCOUNTER — Telehealth: Payer: Self-pay | Admitting: Physician Assistant

## 2021-06-05 NOTE — Telephone Encounter (Signed)
Returned call to patient who reports SOB that is new since her ablation in Jan 2023 ? ?She reports recurrent episodes of AFib ? ?She is active, still working. She was walking yesterday and after minimal exertion she was in AFib. She reports the episodes will subside and she does not have sustained Afib. She reports SOB can start after about 5 minutes into exercise. She reports this is NEW since she had her ablation.  ? ?She reports she had a visit with AFIB clinic last week and they "told me nothing" ? ?She would like to know if there is anything she should or shouldn't be doing  ? ?She went to ED last week but was told it was MSK - pain at top of left rib-cage ? ?She feels heaviness in her chest at night which is NEW since ablation  ? ?Advised will send to Drs. Burt Knack and [place] to review/advise. She is aware call back may be tomorrow.  ?

## 2021-06-05 NOTE — Telephone Encounter (Signed)
Patient c/o Palpitations:  High priority if patient c/o lightheadedness, shortness of breath, or chest pain ? ?How long have you had palpitations/irregular HR/ Afib? Are you having the symptoms now?  ?Patient reports she has been going in and out of afib since 1/23 ablation with CL.  ? ?Are you currently experiencing lightheadedness, SOB or CP?  ?Not currently but patient has been SOB with exertion ? ?Do you have a history of afib (atrial fibrillation) or irregular heart rhythm? ?No  ? ?Have you checked your BP or HR? (document readings if available):  ?No readings available  ? ?Are you experiencing any other symptoms?  ?SOB on and off since ablation, left-sided CP (see ED notes) ? ? ?

## 2021-06-06 NOTE — Telephone Encounter (Signed)
Reviewed her chart and recent ER evaluation where findings were unremarkable. Her baseline HR is now elevated and it sounds like that is normal following ablation and will slowly return to normal. This may be accounting for some of her exercise intolerance and dyspnea. Could check limited echo and ZIO to make sure nothing else is going on, but will defer to Dr Quentin Ore since she is only about 6 weeks out from AFib ablation. ?

## 2021-06-07 NOTE — Telephone Encounter (Signed)
Called and left message for patient to call back.

## 2021-06-07 NOTE — Telephone Encounter (Signed)
Left message to call back.  ? ?From Dr. Lars Mage: ?Looks like she is having atypical atrial flutter. She had extensive scarring in the left atrium during her atrial fibrillation ablation in January.  ? ?I will have her see the AF clinic to discuss options. I think dofetilide is going to be the best option for her moving forward to maintain normal rhythm.  ? ? ?

## 2021-06-07 NOTE — Telephone Encounter (Signed)
Gave patient recommendations and educated about flutter vs fib. Verbalized agreement for appointment.  ?

## 2021-06-07 NOTE — Telephone Encounter (Signed)
Patient returned my call and is agreeable to appt 06/13/21 with AFC. ?

## 2021-06-09 DIAGNOSIS — I4891 Unspecified atrial fibrillation: Secondary | ICD-10-CM | POA: Diagnosis not present

## 2021-06-09 DIAGNOSIS — E1169 Type 2 diabetes mellitus with other specified complication: Secondary | ICD-10-CM | POA: Diagnosis not present

## 2021-06-09 DIAGNOSIS — K2100 Gastro-esophageal reflux disease with esophagitis, without bleeding: Secondary | ICD-10-CM | POA: Diagnosis not present

## 2021-06-09 DIAGNOSIS — G4700 Insomnia, unspecified: Secondary | ICD-10-CM | POA: Diagnosis not present

## 2021-06-09 DIAGNOSIS — D692 Other nonthrombocytopenic purpura: Secondary | ICD-10-CM | POA: Diagnosis not present

## 2021-06-09 DIAGNOSIS — E559 Vitamin D deficiency, unspecified: Secondary | ICD-10-CM | POA: Diagnosis not present

## 2021-06-09 DIAGNOSIS — D6869 Other thrombophilia: Secondary | ICD-10-CM | POA: Diagnosis not present

## 2021-06-09 DIAGNOSIS — I10 Essential (primary) hypertension: Secondary | ICD-10-CM | POA: Diagnosis not present

## 2021-06-09 DIAGNOSIS — R5383 Other fatigue: Secondary | ICD-10-CM | POA: Diagnosis not present

## 2021-06-09 DIAGNOSIS — E538 Deficiency of other specified B group vitamins: Secondary | ICD-10-CM | POA: Diagnosis not present

## 2021-06-13 ENCOUNTER — Encounter (HOSPITAL_COMMUNITY): Payer: Self-pay | Admitting: Nurse Practitioner

## 2021-06-13 ENCOUNTER — Other Ambulatory Visit: Payer: Self-pay

## 2021-06-13 ENCOUNTER — Ambulatory Visit: Payer: Medicare Other | Admitting: Cardiology

## 2021-06-13 ENCOUNTER — Ambulatory Visit (HOSPITAL_COMMUNITY)
Admission: RE | Admit: 2021-06-13 | Discharge: 2021-06-13 | Disposition: A | Discharge status: Home / Self Care | Payer: Medicare Other | Source: Ambulatory Visit | Attending: Nurse Practitioner | Admitting: Nurse Practitioner

## 2021-06-13 VITALS — BP 116/74 | HR 72 | Ht 65.0 in | Wt 167.8 lb

## 2021-06-13 DIAGNOSIS — I480 Paroxysmal atrial fibrillation: Secondary | ICD-10-CM | POA: Diagnosis not present

## 2021-06-13 DIAGNOSIS — D6869 Other thrombophilia: Secondary | ICD-10-CM | POA: Diagnosis not present

## 2021-06-13 DIAGNOSIS — Z8249 Family history of ischemic heart disease and other diseases of the circulatory system: Secondary | ICD-10-CM | POA: Diagnosis not present

## 2021-06-13 DIAGNOSIS — Z79899 Other long term (current) drug therapy: Secondary | ICD-10-CM | POA: Insufficient documentation

## 2021-06-13 DIAGNOSIS — R9431 Abnormal electrocardiogram [ECG] [EKG]: Secondary | ICD-10-CM | POA: Insufficient documentation

## 2021-06-13 DIAGNOSIS — I440 Atrioventricular block, first degree: Secondary | ICD-10-CM | POA: Diagnosis not present

## 2021-06-13 DIAGNOSIS — I444 Left anterior fascicular block: Secondary | ICD-10-CM | POA: Diagnosis not present

## 2021-06-13 DIAGNOSIS — I4892 Unspecified atrial flutter: Secondary | ICD-10-CM | POA: Diagnosis not present

## 2021-06-13 NOTE — Progress Notes (Addendum)
? ?Primary Care Physician: Velna Hatchet, MD ?Referring Physician: Dr. Quentin Ore  ?Cardiologist: Dr. Burt Knack  ? ? ?Brittney Fox is a 81 y.o. female with a h/o paroxysmal afib that had afib ablation one month ago, she is in SR today. She saw Dr. Burt Knack early February and she was in [place] but with v rates near 100 bpm. He started metoprolol succinate 50 mg daily. This has helped curb her v rates so today she is in SR at 71 bpm. No swallowing or groin issues.  ? ?F/u in afib clinic, 06/13/21. She was asked to come here to discuss Tikosyn as she had a ER visit for chest pain and was found to be in rate controlled atrial flutter. Due to the significant amount of scar tissue that was found during the ablation procedure, Dr. Quentin Ore  felt that she may need Tikosyn to be able to maintain SR long term. She is in SR today.  ? ?Today, she denies symptoms of palpitations, chest pain, shortness of breath, orthopnea, PND, lower extremity edema, dizziness, presyncope, syncope, or neurologic sequela. The patient is tolerating medications without difficulties and is otherwise without complaint today.  ? ?Past Medical History:  ?Diagnosis Date  ? Anal fissure   ? as a child  ? Blood transfusion without reported diagnosis   ? Diabetes mellitus   ? managed by diet and exercise  ? Diverticulosis of colon (without mention of hemorrhage)   ? Fatty liver   ? GERD (gastroesophageal reflux disease)   ? Heart murmur   ? Hyperlipidemia   ? Irritable bowel syndrome   ? Lymphocytic colitis   ? chronic  ? Neuromuscular disorder ([place])   ? neuropathy  ? Osteoarthritis   ? Osteopenia 09/2007  ? Other mucopurulent conjunctivitis   ? Premature ventricular contractions   ? Raynaud's syndrome   ? Rosacea   ? opitcal  ? Spinal stenosis   ? ?Past Surgical History:  ?Procedure Laterality Date  ? ATRIAL FIBRILLATION ABLATION N/A 04/24/2021  ? Procedure: ATRIAL FIBRILLATION ABLATION;  Surgeon: Vickie Epley, MD;  Location: [facility];   Service: Cardiovascular;  Laterality: N/A;  ? CATARACT EXTRACTION Bilateral   ? CHOLECYSTECTOMY    ? COLONOSCOPY    ? dental implants    ? DILATION AND CURETTAGE OF UTERUS  1990  ? FOOT SURGERY    ? HIP SURGERY Bilateral 1973  ? Jaw Implants  2007  ? 2018.2019  ? KNEE ARTHROSCOPY Right   ? lens implant    ? ovarian wedge resection  1964  ? REPLACEMENT TOTAL KNEE Bilateral 2005, 2007  ? TOTAL ABDOMINAL HYSTERECTOMY W/ BILATERAL SALPINGOOPHORECTOMY  1990  ? Excessive Bleeding   ? TOTAL KNEE ARTHROPLASTY Left 08/2003  ? TOTAL KNEE ARTHROPLASTY Right 08/2005  ? ? ?Current Outpatient Medications  ?Medication Sig Dispense Refill  ? acetaminophen (TYLENOL) 500 MG tablet Take 500 mg by mouth every 6 (six) hours as needed for moderate pain.    ? Cholecalciferol (VITAMIN D) 50 MCG (2000 UT) tablet Take 2,000 Units by mouth daily.    ? dicyclomine (BENTYL) 20 MG tablet TAKE ONE TABLET EVERY 6 HOURS AS NEEDED 90 tablet 0  ? diphenoxylate-atropine (LOMOTIL) 2.5-0.025 MG per tablet TAKE ONE (1) TABLET FOUR TIMES EACH DAY AS NEEDED FOR DIARRHEA (Patient taking differently: Take 1-2 tablets by mouth 4 (four) times daily as needed for diarrhea or loose stools.) 30 tablet 0  ? ELIQUIS 5 MG TABS tablet TAKE ONE TABLET TWICE DAILY  60 tablet 5  ? fluocinonide cream (LIDEX) 2.87 % Apply 1 application. topically as needed.    ? glucose blood (ONETOUCH VERIO) test strip     ? hydrocortisone (ANUSOL-HC) 2.5 % rectal cream Place 1 application rectally at bedtime. 30 g 1  ? JARDIANCE 10 MG TABS tablet Take 1 tablet by mouth daily.    ? loperamide (IMODIUM A-D) 2 MG tablet Take 2-4 mg by mouth 4 (four) times daily as needed for diarrhea or loose stools.    ? meclizine (ANTIVERT) 25 MG tablet Take 25 mg by mouth as needed.    ? metoprolol succinate (TOPROL-XL) 50 MG 24 hr tablet Take 1 tablet (50 mg total) by mouth daily. Take with or immediately following a meal. 90 tablet 3  ? naproxen sodium (ALEVE) 220 MG tablet Take 220-440 mg by mouth daily  as needed (pain).    ? omeprazole (PRILOSEC) 40 MG capsule TAKE ONE CAPSULE EACH DAY 90 capsule 3  ? Polyvinyl Alcohol-Povidone (REFRESH OP) Place 1 drop into both eyes daily as needed (dry eyes).    ? potassium chloride (KLOR-CON) 10 MEQ tablet TAKE TWO TABLETS EVERY DAY 180 tablet 0  ? Probiotic Product (ALIGN PO) Take 1 capsule by mouth daily.    ? Psyllium (METAMUCIL PO) Take 1 Dose by mouth daily as needed (constipation). 1 dose = 1 tablespoon    ? Pyridoxine HCl (B-6 PO) Take 1 tablet by mouth at bedtime.    ? spironolactone-hydrochlorothiazide (ALDACTAZIDE) 25-25 MG per tablet TAKE ONE TABLET EACH DAY 30 tablet 5  ? temazepam (RESTORIL) 15 MG capsule Take 15 mg by mouth as needed.    ? terconazole (TERAZOL 7) 0.4 % vaginal cream as needed.    ? vitamin B-12 (CYANOCOBALAMIN) 1000 MCG tablet Take 1,000 mcg by mouth daily.    ? ?No current facility-administered medications for this encounter.  ? ? ?Allergies  ?Allergen Reactions  ? Amoxicillin Other (See Comments)  ?  Ulcers and thrush  ? Codeine Phosphate   ?  headache  ? Metformin Hcl Other (See Comments)  ? Morphine Sulfate Swelling  ?  tongue swelling  ? Sitagliptin Other (See Comments)  ? ? ?Social History  ? ?Socioeconomic History  ? Marital status: Widowed  ?  Spouse name: Not on file  ? Number of children: 2  ? Years of education: Not on file  ? Highest education level: Not on file  ?Occupational History  ? Occupation: REAL ESTATE  ?  Employer: [employer]  ?Tobacco Use  ? Smoking status: Former  ?  Types: Cigarettes  ?  Quit date: 10/22/1961  ?  Years since quitting: 59.6  ? Smokeless tobacco: Never  ?Vaping Use  ? Vaping Use: Never used  ?Substance and Sexual Activity  ? Alcohol use: No  ?  Alcohol/week: 0.0 standard drinks  ? Drug use: No  ? Sexual activity: Not Currently  ?  Partners: Male  ?  Birth control/protection: Surgical  ?  Comment: hysterectomy  ?Other Topics Concern  ? Not on file  ?Social History Narrative  ? Not on file   ? ?Social Determinants of Health  ? ?Financial Resource Strain: Not on file  ?Food Insecurity: Not on file  ?Transportation Needs: Not on file  ?Physical Activity: Not on file  ?Stress: Not on file  ?Social Connections: Not on file  ?Intimate Partner Violence: Not on file  ? ? ?Family History  ?Problem Relation Age of Onset  ? Lung  cancer Sister   ? Osteoporosis Mother   ? Heart disease Father   ? Diabetes Paternal Uncle   ? Ovarian cancer Maternal Grandmother   ? Breast cancer Sister 95  ? Colon cancer Neg Hx   ? Stomach cancer Neg Hx   ? Esophageal cancer Neg Hx   ? Rectal cancer Neg Hx   ? ? ?ROS- All systems are reviewed and negative except as per the HPI above ? ?Physical Exam: ?Vitals:  ? 06/13/21 0831  ?BP: 116/74  ?Pulse: 72  ?Weight: 76.1 kg  ?Height: '5\' 5"'$  (1.651 m)  ? ?Wt Readings from Last 3 Encounters:  ?06/13/21 76.1 kg  ?05/23/21 75.8 kg  ?05/10/21 74.9 kg  ? ? ?Labs: ?Lab Results  ?Component Value Date  ? NA 136 05/30/2021  ? K 3.6 05/30/2021  ? CL 98 05/30/2021  ? CO2 28 05/30/2021  ? GLUCOSE 127 (H) 05/30/2021  ? BUN 17 05/30/2021  ? CREATININE 0.71 05/30/2021  ? CALCIUM 9.2 05/30/2021  ? MG 1.7 07/10/2017  ? ?No results found for: INR ?Lab Results  ?Component Value Date  ? CHOL 144 09/13/2015  ? HDL 59 09/13/2015  ? [place] 68 09/13/2015  ? TRIG 87 09/13/2015  ? ? ? ?GEN- The patient is well appearing, alert and oriented x 3 today.   ?Head- normocephalic, atraumatic ?Eyes-  Sclera clear, conjunctiva pink ?Ears- hearing intact ?Oropharynx- clear ?Neck- supple, no JVP ?Lymph- no cervical lymphadenopathy ?Lungs- Clear to ausculation bilaterally, normal work of breathing ?Heart- Regular rate and rhythm, no murmurs, rubs or gallops, PMI not laterally displaced ?GI- soft, NT, ND, + BS ?Extremities- no clubbing, cyanosis, or edema ?MS- no significant deformity or atrophy ?Skin- no rash or lesion ?Psych- euthymic mood, full affect ?Neuro- strength and sensation are intact ? ?EKG-SR with first degree AV  block, LAFB,  pr int 276 ms, qrs int 96 ms, qtc 470 ms  ? ? ? ?Assessment and Plan:   ?1. Afib/flutter   ?S/p ablation 10 weeks ago  ?Had atrial flutter on ekg for ER visit  3/1 ?Now in SR ?She states that

## 2021-06-24 ENCOUNTER — Other Ambulatory Visit (HOSPITAL_BASED_OUTPATIENT_CLINIC_OR_DEPARTMENT_OTHER): Payer: Self-pay | Admitting: Obstetrics & Gynecology

## 2021-06-24 MED ORDER — SULFAMETHOXAZOLE-TRIMETHOPRIM 800-160 MG PO TABS: 1.0000 | ORAL_TABLET | Freq: Two times a day (BID) | ORAL | 1 refills | Status: DC

## 2021-06-26 ENCOUNTER — Ambulatory Visit (INDEPENDENT_AMBULATORY_CARE_PROVIDER_SITE_OTHER): Payer: Medicare Other | Admitting: Podiatry

## 2021-06-26 ENCOUNTER — Other Ambulatory Visit (HOSPITAL_BASED_OUTPATIENT_CLINIC_OR_DEPARTMENT_OTHER): Payer: Self-pay | Admitting: Obstetrics & Gynecology

## 2021-06-26 ENCOUNTER — Other Ambulatory Visit: Payer: Self-pay

## 2021-06-26 ENCOUNTER — Encounter: Payer: Self-pay | Admitting: Podiatry

## 2021-06-26 DIAGNOSIS — D169 Benign neoplasm of bone and articular cartilage, unspecified: Secondary | ICD-10-CM | POA: Diagnosis not present

## 2021-06-26 DIAGNOSIS — G629 Polyneuropathy, unspecified: Secondary | ICD-10-CM

## 2021-06-26 DIAGNOSIS — D689 Coagulation defect, unspecified: Secondary | ICD-10-CM

## 2021-06-26 DIAGNOSIS — L84 Corns and callosities: Secondary | ICD-10-CM | POA: Diagnosis not present

## 2021-06-26 MED ORDER — FLUCONAZOLE 150 MG PO TABS
150.0000 mg | ORAL_TABLET | Freq: Once | ORAL | 1 refills | Status: AC
Start: 2021-06-26 — End: 2021-06-26

## 2021-06-26 NOTE — Progress Notes (Signed)
Diflucan rx to pharmacy per pt request ?

## 2021-06-28 NOTE — Progress Notes (Signed)
Subjective:  ? ?Patient ID: Brittney Fox, female   DOB: 81 y.o.   MRN: [id number]  ? ?HPI ?Patient presents to the office concerned about pain in the right fifth digit and also generalized structural issues.  Patient states that she is getting sharp pain in this right toe she is now on a blood thinner due to A-fib and cannot take care of anything herself.  Patient does not smoke likes to be active if possible ? ? ?Review of Systems  ?All other systems reviewed and are negative. ? ? ?   ?Objective:  ?Physical Exam ?Vitals and nursing note reviewed.  ?Constitutional:   ?   Appearance: She is well-developed.  ?Pulmonary:  ?   Effort: Pulmonary effort is normal.  ?Musculoskeletal:     ?   General: Normal range of motion.  ?Skin: ?   General: Skin is warm.  ?Neurological:  ?   Mental Status: She is alert.  ?  ?Neurovascular status intact muscle strength found to be adequate range of motion within normal limits.  Patient is noted to have moderate digital deformities and does have rotation of the fifth digit right with distal medial keratotic lesion with exostotic lesion present creating the pain she is experiencing.  No breakdown of tissue no proximal edema erythema drainage noted with good digital perfusion and patient found to be well oriented ? ?   ?Assessment:  ?Patient who is at risk on blood thinner with exostotic lesion digit 5 right hammertoe deformity rotational component fifth digit right ? ?   ?Plan:  ?H&P reviewed condition discussed treatment options.  We will get a try conservative even though eventual exostectomy may be necessary and I conveyed this to the patient.  Patient wants to undergo this and I went ahead and using sharp sterile debridement technique I debrided out the lesion no angiogenic bleeding applied dressing with cushioning and patient will be seen back as needed and I did educate on the possibility for exostectomy in the future ?   ? ? ?

## 2021-06-30 DIAGNOSIS — I10 Essential (primary) hypertension: Secondary | ICD-10-CM | POA: Diagnosis not present

## 2021-06-30 DIAGNOSIS — E785 Hyperlipidemia, unspecified: Secondary | ICD-10-CM | POA: Diagnosis not present

## 2021-06-30 DIAGNOSIS — M8580 Other specified disorders of bone density and structure, unspecified site: Secondary | ICD-10-CM | POA: Diagnosis not present

## 2021-06-30 DIAGNOSIS — E1169 Type 2 diabetes mellitus with other specified complication: Secondary | ICD-10-CM | POA: Diagnosis not present

## 2021-07-03 DIAGNOSIS — I480 Paroxysmal atrial fibrillation: Secondary | ICD-10-CM | POA: Diagnosis not present

## 2021-07-04 NOTE — Addendum Note (Signed)
Encounter addended by: Juluis Mire, RN on: 07/04/2021 9:19 AM ? Actions taken: Imaging Exam ended

## 2021-07-05 DIAGNOSIS — Z1231 Encounter for screening mammogram for malignant neoplasm of breast: Secondary | ICD-10-CM | POA: Diagnosis not present

## 2021-07-10 ENCOUNTER — Telehealth: Payer: Self-pay | Admitting: Internal Medicine

## 2021-07-10 NOTE — Telephone Encounter (Signed)
Left message for pt to call back  °

## 2021-07-10 NOTE — Telephone Encounter (Signed)
Inbound call from patient have severe diarrhea. States she have tried imodium and meta mucil and it does not seem to work ?

## 2021-07-11 NOTE — Telephone Encounter (Signed)
Pt states she has been having diarrhea and is taking Imodium and Lomotil with no relief. She has also tried metamucil and it did not help. Reports yesterday she took 3 imodium and 3 lomotil. This morning she had 4 watery diarrhea stools. Also reports urgency and sometimes she doesn't make it to the bathroom and has accidents. Reports she is just tired of it. Pt scheduled to see Dr. Henrene Pastor tomorrow at 11:20am. Pt aware of appt. ?

## 2021-07-12 ENCOUNTER — Ambulatory Visit (INDEPENDENT_AMBULATORY_CARE_PROVIDER_SITE_OTHER): Payer: Medicare Other | Admitting: Internal Medicine

## 2021-07-12 ENCOUNTER — Encounter: Payer: Self-pay | Admitting: Internal Medicine

## 2021-07-12 VITALS — BP 102/70 | HR 70 | Ht 65.0 in | Wt 162.0 lb

## 2021-07-12 DIAGNOSIS — K219 Gastro-esophageal reflux disease without esophagitis: Secondary | ICD-10-CM | POA: Diagnosis not present

## 2021-07-12 DIAGNOSIS — K580 Irritable bowel syndrome with diarrhea: Secondary | ICD-10-CM

## 2021-07-12 DIAGNOSIS — K5732 Diverticulitis of large intestine without perforation or abscess without bleeding: Secondary | ICD-10-CM

## 2021-07-12 MED ORDER — HYOSCYAMINE SULFATE ER 0.375 MG PO TB12: 0.3750 mg | ORAL_TABLET | Freq: Every morning | ORAL | 0 refills | Status: DC

## 2021-07-12 NOTE — Patient Instructions (Signed)
If you are age 80 or older, your body mass index should be between 23-30. Your Body mass index is 26.96 kg/m?Marland Kitchen If this is out of the aforementioned range listed, please consider follow up with your Primary Care Provider. ? ?If you are age 57 or younger, your body mass index should be between 19-25. Your Body mass index is 26.96 kg/m?Marland Kitchen If this is out of the aformentioned range listed, please consider follow up with your Primary Care Provider.  ? ?________________________________________________________ ? ?The [facility] providers would like to encourage you to use [facility] to communicate with providers for non-urgent requests or questions.  Due to long hold times on the telephone, sending your provider a message by [facility] may be a faster and more efficient way to get a response.  Please allow 48 business hours for a response.  Please remember that this is for non-urgent requests.  ?_______________________________________________________ ? ?We have sent the following medications to your pharmacy for you to pick up at your convenience: Levbid ? ?Take IB Gard over the counter - 1 before meals ? ?Please follow up on ______________________________ ? ?

## 2021-07-12 NOTE — Progress Notes (Signed)
HISTORY OF PRESENT ILLNESS: ? ?Brittney Fox is a 81 y.o. female with past medical history as listed below including atrial fibrillation/flutter for which she is on Eliquis.  She was last seen in the office January 11, 2021 regarding minor hemorrhoidal bleeding, irregular bowel habits, and GERD.  See that dictation.  Citrucel and Anusol were prescribed.  She presents today with chief complaint of intermittent urgency with diarrhea.  As well occasional incontinent episodes.  She tells me the problem has been worse over the past few months.  She generally has a few normal bowel movements in the morning followed by diarrhea.  Thereafter she may have urgency with diarrhea.  Food precipitates symptoms.  She has been taking both Imodium and Lomotil.  No problems when she sleeping.  She states she has formed bowel movements just about half the time.  She does have a history of IBS and tells me that she had been on antispasmodic in the remote past which helped.  Patient underwent colonoscopy and upper endoscopy previously February 2021.  At this time she denies bleeding or abdominal pain.  Her weight has been stable.  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 ? ?REVIEW OF SYSTEMS: ? ?All non-GI ROS negative unless otherwise stated in the HPI. ? ?Past Medical History:  ?Diagnosis Date  ? Anal fissure   ? as a child  ? Blood transfusion without reported diagnosis   ? Diabetes mellitus   ? managed by diet and exercise  ? Diverticulosis of colon (without mention of hemorrhage)   ? Fatty liver   ? GERD (gastroesophageal reflux disease)   ? Heart murmur   ? Hyperlipidemia   ? Irritable bowel syndrome   ? Lymphocytic colitis   ? chronic  ? Neuromuscular disorder ([place])   ? neuropathy  ? Osteoarthritis   ? Osteopenia 09/2007  ? Other mucopurulent conjunctivitis   ? Premature ventricular contractions   ? Raynaud's syndrome   ? Rosacea   ? opitcal  ? Spinal stenosis   ? ? ?Past Surgical History:  ?Procedure  Laterality Date  ? ATRIAL FIBRILLATION ABLATION N/A 04/24/2021  ? Procedure: ATRIAL FIBRILLATION ABLATION;  Surgeon: Vickie Epley, MD;  Location: [facility];  Service: Cardiovascular;  Laterality: N/A;  ? CATARACT EXTRACTION Bilateral   ? CHOLECYSTECTOMY    ? COLONOSCOPY    ? dental implants    ? DILATION AND CURETTAGE OF UTERUS  1990  ? FOOT SURGERY    ? HIP SURGERY Bilateral 1973  ? Jaw Implants  2007  ? 2018.2019  ? KNEE ARTHROSCOPY Right   ? lens implant    ? ovarian wedge resection  1964  ? REPLACEMENT TOTAL KNEE Bilateral 2005, 2007  ? TOTAL ABDOMINAL HYSTERECTOMY W/ BILATERAL SALPINGOOPHORECTOMY  1990  ? Excessive Bleeding   ? TOTAL KNEE ARTHROPLASTY Left 08/2003  ? TOTAL KNEE ARTHROPLASTY Right 08/2005  ? ? ?Social History ?[place]  reports that she quit smoking about 59 years ago. Her smoking use included cigarettes. She has never used smokeless tobacco. She reports that she does not drink alcohol and does not use drugs. ? ?family history includes Breast cancer (age of onset: 67) in her sister; Diabetes in her paternal uncle; Heart disease in her father; Lung cancer in her sister; Osteoporosis in her mother; Ovarian cancer in her maternal grandmother. ? ?Allergies  ?Allergen Reactions  ? Amoxicillin Other (See Comments)  ?  Ulcers and thrush  ? Codeine Phosphate   ?  headache  ? Metformin Hcl Other (See Comments)  ? Morphine Sulfate Swelling  ?  tongue swelling  ? Sitagliptin Other (See Comments)  ? ? ?  ? ?PHYSICAL EXAMINATION: ?Vital signs: BP 102/70   Pulse 70   Ht '5\' 5"'$  (1.651 m)   Wt 162 lb (73.5 kg)   LMP 04/02/1988   BMI 26.96 kg/m?   ?Constitutional: generally well-appearing, no acute distress ?Psychiatric: alert and oriented x3, cooperative ?Eyes: extraocular movements intact, anicteric, conjunctiva pink ?Mouth: oral pharynx moist, no lesions ?Neck: supple no lymphadenopathy ?Cardiovascular: heart regular rate and rhythm, no murmur ?Lungs: clear to auscultation  bilaterally ?Abdomen: soft, nontender, nondistended, no obvious ascites, no peritoneal signs, normal bowel sounds, no organomegaly ?Rectal: Omitted ?Extremities: no clubbing, cyanosis, or lower extremity edema bilaterally ?Skin: no lesions on visible extremities ?Neuro: No focal deficits.  Cranial nerves intact ? ?ASSESSMENT: ? ?1.  Diarrhea predominant irritable bowel syndrome ?2.  Prior colonoscopy and upper endoscopy 2021 ?3.  GERD. ? ? ?PLAN: ? ?1.  Prescribe Levbid 0.375 mg.  Take 1 in the morning.  Medication risk reviewed ?2.  Antidiarrheals as needed ?3.  IBgard.  Take 1 before meals ?4.  Wear protective undergarments ?5.  Routine office follow-up 2 months ? ? ? ? ?  ?

## 2021-07-13 ENCOUNTER — Encounter: Payer: Self-pay | Admitting: Internal Medicine

## 2021-07-20 NOTE — Progress Notes (Signed)
?Electrophysiology Office Follow up Visit Note:   ? ?Date:  07/22/2021  ? ?ID:  Brittney Fox, DOB 1940-10-05, MRN [id number] ? ?PCP:  Brittney Hatchet, MD  ?[facility] Cardiologist:  Brittney Mocha, MD  ?[facility] Electrophysiologist:  Brittney Epley, MD  ? ? ?Interval History:   ? ?Brittney Fox is a 81 y.o. female who presents for a follow up visit.  She underwent an atrial fibrillation ablation on April 24, 2021.  During the ablation heavy scarring was noted in the left atrium.  I performed a PVI, posterior wall isolation, perimitral atrial flutter ablation and a complex fractionated atrial electrogram ablation.  I plan to initiate antiarrhythmic drugs if recurrence of an arrhythmia was noted.  She has since had recurrence of her arrhythmia and wore a recent ZIO monitor which resulted July 06, 2021 and showed a 40% burden of atrial flutter.  The patient saw Brittney Fox on June 13, 2021.  At that appointment she was in an atypical atrial flutter. ? ?Today she tells me that she is doing relatively okay.  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  She does tell me that she can tell she is out of rhythm.  She is felt her heart racing at times and has fatigue with exertion.  She continues to take Eliquis for stroke prophylaxis without bleeding issues.  She did bring up watchman during today's clinic appointment. ?  ? ?Past Medical History:  ?Diagnosis Date  ? Anal fissure   ? as a child  ? Blood transfusion without reported diagnosis   ? Diabetes mellitus   ? managed by diet and exercise  ? Diverticulosis of colon (without mention of hemorrhage)   ? Fatty liver   ? GERD (gastroesophageal reflux disease)   ? Heart murmur   ? Hyperlipidemia   ? Irritable bowel syndrome   ? Lymphocytic colitis   ? chronic  ? Neuromuscular disorder ([place])   ? neuropathy  ? Osteoarthritis   ? Osteopenia 09/2007  ? Other mucopurulent conjunctivitis   ? Premature ventricular contractions   ? Raynaud's syndrome    ? Rosacea   ? opitcal  ? Spinal stenosis   ? ? ?Past Surgical History:  ?Procedure Laterality Date  ? ATRIAL FIBRILLATION ABLATION N/A 04/24/2021  ? Procedure: ATRIAL FIBRILLATION ABLATION;  Surgeon: Brittney Epley, MD;  Location: [facility];  Service: Cardiovascular;  Laterality: N/A;  ? CATARACT EXTRACTION Bilateral   ? CHOLECYSTECTOMY    ? COLONOSCOPY    ? dental implants    ? DILATION AND CURETTAGE OF UTERUS  1990  ? FOOT SURGERY    ? HIP SURGERY Bilateral 1973  ? Jaw Implants  2007  ? 2018.2019  ? KNEE ARTHROSCOPY Right   ? lens implant    ? ovarian wedge resection  1964  ? REPLACEMENT TOTAL KNEE Bilateral 2005, 2007  ? TOTAL ABDOMINAL HYSTERECTOMY W/ BILATERAL SALPINGOOPHORECTOMY  1990  ? Excessive Bleeding   ? TOTAL KNEE ARTHROPLASTY Left 08/2003  ? TOTAL KNEE ARTHROPLASTY Right 08/2005  ? ? ?Current Medications: ?Current Meds  ?Medication Sig  ? acetaminophen (TYLENOL) 500 MG tablet Take 500 mg by mouth every 6 (six) hours as needed for moderate pain.  ? amiodarone (PACERONE) 200 MG tablet Take 1 tablet (200 mg total) by mouth 2 (two) times daily for 14 days, THEN 1 tablet (200 mg total) daily.  ? Cholecalciferol (VITAMIN D) 50 MCG (2000 UT) tablet Take 2,000 Units by mouth  daily.  ? dicyclomine (BENTYL) 20 MG tablet TAKE ONE TABLET EVERY 6 HOURS AS NEEDED  ? diphenoxylate-atropine (LOMOTIL) 2.5-0.025 MG per tablet TAKE ONE (1) TABLET FOUR TIMES EACH DAY AS NEEDED FOR DIARRHEA (Patient taking differently: Take 1-2 tablets by mouth 4 (four) times daily as needed for diarrhea or loose stools. Alternating with imodium.)  ? ELIQUIS 5 MG TABS tablet TAKE ONE TABLET TWICE DAILY  ? fluocinonide cream (LIDEX) 7.82 % Apply 1 application. topically as needed.  ? glucose blood (ONETOUCH VERIO) test strip   ? hydrocortisone (ANUSOL-HC) 2.5 % rectal cream Place 1 application rectally at bedtime.  ? hyoscyamine (LEVBID) 0.375 MG 12 hr tablet Take 1 tablet (0.375 mg total) by mouth in the morning.  ? JARDIANCE  10 MG TABS tablet Take 1 tablet by mouth daily.  ? loperamide (IMODIUM A-D) 2 MG tablet Take 2-4 mg by mouth 4 (four) times daily as needed for diarrhea or loose stools.  ? meclizine (ANTIVERT) 25 MG tablet Take 25 mg by mouth as needed.  ? metoprolol succinate (TOPROL-XL) 50 MG 24 hr tablet Take 1 tablet (50 mg total) by mouth daily. Take with or immediately following a meal.  ? naproxen sodium (ALEVE) 220 MG tablet Take 220-440 mg by mouth daily as needed (pain).  ? omeprazole (PRILOSEC) 40 MG capsule TAKE ONE CAPSULE EACH DAY  ? Polyvinyl Alcohol-Povidone (REFRESH OP) Place 1 drop into both eyes daily as needed (dry eyes).  ? potassium chloride (KLOR-CON) 10 MEQ tablet TAKE TWO TABLETS EVERY DAY  ? Probiotic Product (ALIGN PO) Take 1 capsule by mouth daily.  ? Psyllium (METAMUCIL PO) Take 1 Dose by mouth daily as needed (constipation). 1 dose = 1 tablespoon  ? Pyridoxine HCl (B-6 PO) Take 1 tablet by mouth at bedtime.  ? spironolactone-hydrochlorothiazide (ALDACTAZIDE) 25-25 MG per tablet TAKE ONE TABLET EACH DAY  ? temazepam (RESTORIL) 15 MG capsule Take 15 mg by mouth as needed.  ? terconazole (TERAZOL 7) 0.4 % vaginal cream as needed.  ? vitamin B-12 (CYANOCOBALAMIN) 1000 MCG tablet Take 1,000 mcg by mouth daily.  ?  ? ?Allergies:   Amoxicillin, Codeine phosphate, Metformin hcl, Morphine sulfate, and Sitagliptin  ? ?Social History  ? ?Socioeconomic History  ? Marital status: Widowed  ?  Spouse name: Not on file  ? Number of children: 2  ? Years of education: Not on file  ? Highest education level: Not on file  ?Occupational History  ? Occupation: REAL ESTATE  ?  Employer: [employer]  ?Tobacco Use  ? Smoking status: Former  ?  Types: Cigarettes  ?  Quit date: 10/22/1961  ?  Years since quitting: 59.7  ? Smokeless tobacco: Never  ?Vaping Use  ? Vaping Use: Never used  ?Substance and Sexual Activity  ? Alcohol use: No  ?  Alcohol/week: 0.0 standard drinks  ? Drug use: No  ? Sexual activity:  Not Currently  ?  Partners: Male  ?  Birth control/protection: Surgical  ?  Comment: hysterectomy  ?Other Topics Concern  ? Not on file  ?Social History Narrative  ? Not on file  ? ?Social Determinants of Health  ? ?Financial Resource Strain: Not on file  ?Food Insecurity: Not on file  ?Transportation Needs: Not on file  ?Physical Activity: Not on file  ?Stress: Not on file  ?Social Connections: Not on file  ?  ? ?Family History: ?The patient's family history includes Breast cancer (age of onset: 51) in her sister;  Diabetes in her paternal uncle; Heart disease in her father; Lung cancer in her sister; Osteoporosis in her mother; Ovarian cancer in her maternal grandmother. There is no history of Colon cancer, Stomach cancer, Esophageal cancer, or Rectal cancer. ? ?ROS:   ?Please see the history of present illness.    ?All other systems reviewed and are negative. ? ?EKGs/Labs/Other Studies Reviewed:   ? ?The following studies were reviewed today: ? ? ? ?EKG:  The ekg ordered today demonstrates atypical atrial flutter with a ventricular rate of 91 bpm. ? ?Recent Labs: ?05/30/2021: BUN 17; Creatinine, Ser 0.71; Hemoglobin 15.6; Platelets 262; Potassium 3.6; Sodium 136  ?Recent Lipid Panel ?   ?Component Value Date/Time  ? CHOL 144 09/13/2015 0734  ? TRIG 87 09/13/2015 0734  ? TRIG 88 02/06/2006 1028  ? HDL 59 09/13/2015 0734  ? CHOLHDL 2.4 09/13/2015 0734  ? VLDL 17 09/13/2015 0734  ? [place] 68 09/13/2015 0734  ? LDLDIRECT 150.2 11/28/2010 1131  ? ? ?Physical Exam:   ? ?VS:  BP 110/80   Pulse 91   Ht '5\' 5"'$  (1.651 m)   Wt 166 lb 3.2 oz (75.4 kg)   LMP 04/02/1988   SpO2 96%   BMI 27.66 kg/m?    ? ?Wt Readings from Last 3 Encounters:  ?07/21/21 166 lb 3.2 oz (75.4 kg)  ?07/12/21 162 lb (73.5 kg)  ?06/13/21 167 lb 12.8 oz (76.1 kg)  ?  ? ?GEN: Well nourished, well developed in no acute distress ?HEENT: Normal ?NECK: No JVD; No carotid bruits ?LYMPHATICS: No lymphadenopathy ?CARDIAC: RRR, no murmurs, rubs,  gallops ?RESPIRATORY:  Clear to auscultation without rales, wheezing or rhonchi  ?ABDOMEN: Soft, non-tender, non-distended ?MUSCULOSKELETAL:  No edema; No deformity  ?SKIN: Warm and dry ?NEUROLOGIC:  Alert and oriented x

## 2021-07-21 ENCOUNTER — Ambulatory Visit (INDEPENDENT_AMBULATORY_CARE_PROVIDER_SITE_OTHER): Payer: Medicare Other | Admitting: Cardiology

## 2021-07-21 ENCOUNTER — Encounter: Payer: Self-pay | Admitting: Cardiology

## 2021-07-21 VITALS — BP 110/80 | HR 91 | Ht 65.0 in | Wt 166.2 lb

## 2021-07-21 DIAGNOSIS — I484 Atypical atrial flutter: Secondary | ICD-10-CM

## 2021-07-21 DIAGNOSIS — I4819 Other persistent atrial fibrillation: Secondary | ICD-10-CM | POA: Diagnosis not present

## 2021-07-21 MED ORDER — AMIODARONE HCL 200 MG PO TABS: ORAL_TABLET | ORAL | 3 refills | Status: DC

## 2021-07-21 NOTE — Patient Instructions (Addendum)
Medication Instructions:  ?Start Amiodarone 200 mg two times a day for 14 days then 200 mg daily  ?Your physician recommends that you continue on your current medications as directed. Please refer to the Current Medication list given to you today. ?*If you need a refill on your cardiac medications before your next appointment, please call your pharmacy* ? ?Lab Work: ?None. ?If you have labs (blood work) drawn today and your tests are completely normal, you will receive your results only by: ?MyChart Message (if you have MyChart) OR ?A paper copy in the mail ?If you have any lab test that is abnormal or we need to change your treatment, we will call you to review the results. ? ?Testing/Procedures: ?None. ? ?Follow-Up: ?At [facility], you and your health needs are our priority.  As part of our continuing mission to provide you with exceptional heart care, we have created designated Provider Care Teams.  These Care Teams include your primary Cardiologist (physician) and Advanced Practice Providers (APPs -  Physician Assistants and Nurse Practitioners) who all work together to provide you with the care you need, when you need it. ? ?Your physician wants you to follow-up in: 6-8 weeks with  one of the following Advanced Practice Providers on your designated Care Team:   ? ?Tommye Standard, PA-C ?Legrand Como "Jonni Sanger" [place], PA-C ? ? ?We recommend signing up for the patient portal called "MyChart".  Sign up information is provided on this After Visit Summary.  MyChart is used to connect with patients for Virtual Visits (Telemedicine).  Patients are able to view lab/test results, encounter notes, upcoming appointments, etc.  Non-urgent messages can be sent to your provider as well.   ?To learn more about what you can do with MyChart, go to [email or web address].   ? ?Any Other Special Instructions Will Be Listed Below (If Applicable). ? ? ? ? ?  ? ? ? ?

## 2021-07-30 DIAGNOSIS — M8580 Other specified disorders of bone density and structure, unspecified site: Secondary | ICD-10-CM | POA: Diagnosis not present

## 2021-07-30 DIAGNOSIS — E1169 Type 2 diabetes mellitus with other specified complication: Secondary | ICD-10-CM | POA: Diagnosis not present

## 2021-07-30 DIAGNOSIS — I10 Essential (primary) hypertension: Secondary | ICD-10-CM | POA: Diagnosis not present

## 2021-07-30 DIAGNOSIS — E785 Hyperlipidemia, unspecified: Secondary | ICD-10-CM | POA: Diagnosis not present

## 2021-07-31 ENCOUNTER — Encounter: Payer: Self-pay | Admitting: Cardiovascular Disease

## 2021-07-31 MED ORDER — METOPROLOL SUCCINATE ER 25 MG PO TB24: 25.0000 mg | ORAL_TABLET | Freq: Every day | ORAL | 3 refills | Status: DC

## 2021-07-31 NOTE — Telephone Encounter (Signed)
-----   Message from [name], MD sent at 07/31/2021  4:09 PM EDT ----- ?Please see MyChart message. thx ? ?

## 2021-07-31 NOTE — Telephone Encounter (Signed)
[name], MD ?to [place]   ?   4:09 PM ?Brittney Fox.  I got your message about your low heart rate readings.  As long as you are feeling okay, I would recommend decreasing your metoprolol succinate to 25 mg daily (you are currently taking 50 mg daily).  This tablet should be scored if you could cut it in half to take one half daily which would be the equivalent of 25 mg.  I would continue on the amiodarone as recommended by Dr. Quentin Ore.  ? ?Updated MAR to reflect '25mg'$  Metoprolol Succ dose. ?

## 2021-08-04 ENCOUNTER — Telehealth: Payer: Self-pay | Admitting: Cardiology

## 2021-08-04 NOTE — Telephone Encounter (Signed)
The patient said that she was having this feeling before, at night even prior to starting the amiodarone. She was not sure if she is feeling the fluttering in her heart or if it had something to do the the new Amiodarone. No other symptoms, only has this feeling at night. Advised that it could be the Aflutter. I would continue to monitor, call if she has other symptoms that develop. Will forward to MD if further advisement is needed. ?

## 2021-08-04 NOTE — Telephone Encounter (Signed)
?  STAT if HR is under 50 or over 120 ?(normal HR is 60-100 beats per minute) ? ?What is your heart rate? Resting HR in the 40s ?Normal HR around 67 ? ?Do you have a log of your heart rate readings (document readings)?  ? ?Do you have any other symptoms? She said last night she woke up her body was quivering, she checked her blood sugar it was 116. She wasn't sure what causing it if its her fluttering or the amiodarone.  ?

## 2021-08-07 ENCOUNTER — Other Ambulatory Visit: Payer: Self-pay | Admitting: *Deleted

## 2021-08-07 ENCOUNTER — Other Ambulatory Visit: Payer: Self-pay | Admitting: Cardiovascular Disease

## 2021-08-07 MED ORDER — HYOSCYAMINE SULFATE ER 0.375 MG PO TB12: 0.3750 mg | ORAL_TABLET | Freq: Every morning | ORAL | 0 refills | Status: DC

## 2021-08-10 DIAGNOSIS — E119 Type 2 diabetes mellitus without complications: Secondary | ICD-10-CM | POA: Diagnosis not present

## 2021-08-10 DIAGNOSIS — Z961 Presence of intraocular lens: Secondary | ICD-10-CM | POA: Diagnosis not present

## 2021-08-31 NOTE — Progress Notes (Unsigned)
Cardiology Office Note Date:  08/31/2021  Patient ID:  Brittney Fox February 22, 1941, MRN [id number] PCP:  Brittney Hatchet, MD  Cardiologist:  Dr. Burt Knack Electrophysiologist: Dr. Quentin Ore  ***refresh   Chief Complaint: ***  History of Present Illness: Brittney Fox is a 81 y.o. female with history of DM, Afib, chronic CHF (diastolic)  She comes in today to be seen for Dr. Quentin Ore, last seen by him 07/21/21, discussed her ablation procedure, found significant/heavy scarring of the LA, with plans for AAD if recurrent Af post ablation.  She had seen the AF clinic with recurrent burden of 40% AF and found in AFlutter in the clinic.  That being said, she was still working as a Cabin crew, could tell she was out of rhythm, bet feeling relatively OK.  Discussed AAD options, and amiodarone decided with plans for a f/u APP visit once loaded and plan DCCV if needed.  *** rhythm *** amio labs *** symptoms *** eliquis compliance, bleeding *** volume, cooper   Device information Diagnosed Aug 2022 Flecainide intolerant (SOB, HAs, bloating) PVI ablation 04/24/21 Amiodarone started April 2023  Past Medical History:  Diagnosis Date   Anal fissure    as a child   Blood transfusion without reported diagnosis    Diabetes mellitus    managed by diet and exercise   Diverticulosis of colon (without mention of hemorrhage)    Fatty liver    GERD (gastroesophageal reflux disease)    Heart murmur    Hyperlipidemia    Irritable bowel syndrome    Lymphocytic colitis    chronic   Neuromuscular disorder (HCC)    neuropathy   Osteoarthritis    Osteopenia 09/2007   Other mucopurulent conjunctivitis    Premature ventricular contractions    Raynaud's syndrome    Rosacea    opitcal   Spinal stenosis     Past Surgical History:  Procedure Laterality Date   ATRIAL FIBRILLATION ABLATION N/A 04/24/2021   Procedure: ATRIAL FIBRILLATION ABLATION;  Surgeon: Vickie Epley, MD;  Location:  [facility];  Service: Cardiovascular;  Laterality: N/A;   CATARACT EXTRACTION Bilateral    CHOLECYSTECTOMY     COLONOSCOPY     dental implants     DILATION AND CURETTAGE OF UTERUS  1990   FOOT SURGERY     HIP SURGERY Bilateral 1973   Jaw Implants  2007   2018.2019   KNEE ARTHROSCOPY Right    lens implant     ovarian wedge resection  1964   REPLACEMENT TOTAL KNEE Bilateral 2005, 2007   TOTAL ABDOMINAL HYSTERECTOMY W/ BILATERAL SALPINGOOPHORECTOMY  1990   Excessive Bleeding    TOTAL KNEE ARTHROPLASTY Left 08/2003   TOTAL KNEE ARTHROPLASTY Right 08/2005    Current Outpatient Medications  Medication Sig Dispense Refill   acetaminophen (TYLENOL) 500 MG tablet Take 500 mg by mouth every 6 (six) hours as needed for moderate pain.     amiodarone (PACERONE) 200 MG tablet Take 1 tablet (200 mg total) by mouth 2 (two) times daily for 14 days, THEN 1 tablet (200 mg total) daily. 90 tablet 3   BELSOMRA 10 MG TABS Take by mouth. (Patient not taking: Reported on 07/21/2021)     Cholecalciferol (VITAMIN D) 50 MCG (2000 UT) tablet Take 2,000 Units by mouth daily.     dicyclomine (BENTYL) 20 MG tablet TAKE ONE TABLET EVERY 6 HOURS AS NEEDED 90 tablet 0   diphenoxylate-atropine (LOMOTIL) 2.5-0.025 MG per tablet TAKE ONE (1) TABLET  FOUR TIMES EACH DAY AS NEEDED FOR DIARRHEA (Patient taking differently: Take 1-2 tablets by mouth 4 (four) times daily as needed for diarrhea or loose stools. Alternating with imodium.) 30 tablet 0   ELIQUIS 5 MG TABS tablet TAKE ONE TABLET TWICE DAILY 60 tablet 5   fluocinonide cream (LIDEX) 7.86 % Apply 1 application. topically as needed.     glucose blood (ONETOUCH VERIO) test strip      hydrocortisone (ANUSOL-HC) 2.5 % rectal cream Place 1 application rectally at bedtime. 30 g 1   hyoscyamine (LEVBID) 0.375 MG 12 hr tablet Take 1 tablet (0.375 mg total) by mouth in the morning. 30 tablet 0   JARDIANCE 10 MG TABS tablet Take 1 tablet by mouth daily.     loperamide  (IMODIUM A-D) 2 MG tablet Take 2-4 mg by mouth 4 (four) times daily as needed for diarrhea or loose stools.     meclizine (ANTIVERT) 25 MG tablet Take 25 mg by mouth as needed.     metoprolol succinate (TOPROL XL) 25 MG 24 hr tablet Take 1 tablet (25 mg total) by mouth daily. 90 tablet 3   naproxen sodium (ALEVE) 220 MG tablet Take 220-440 mg by mouth daily as needed (pain).     omeprazole (PRILOSEC) 40 MG capsule TAKE ONE CAPSULE EACH DAY 90 capsule 3   Polyvinyl Alcohol-Povidone (REFRESH OP) Place 1 drop into both eyes daily as needed (dry eyes).     potassium chloride (KLOR-CON) 10 MEQ tablet Take 2 tablets by mouth daily.  Please keep upcoming appointment for future refills. Thank you. 60 tablet 0   Probiotic Product (ALIGN PO) Take 1 capsule by mouth daily.     Psyllium (METAMUCIL PO) Take 1 Dose by mouth daily as needed (constipation). 1 dose = 1 tablespoon     Pyridoxine HCl (B-6 PO) Take 1 tablet by mouth at bedtime.     spironolactone-hydrochlorothiazide (ALDACTAZIDE) 25-25 MG per tablet TAKE ONE TABLET EACH DAY 30 tablet 5   temazepam (RESTORIL) 15 MG capsule Take 15 mg by mouth as needed.     terconazole (TERAZOL 7) 0.4 % vaginal cream as needed.     vitamin B-12 (CYANOCOBALAMIN) 1000 MCG tablet Take 1,000 mcg by mouth daily.     No current facility-administered medications for this visit.    Allergies:   Amoxicillin, Codeine phosphate, Metformin hcl, Morphine sulfate, and Sitagliptin   Social History:  The patient  reports that she quit smoking about 59 years ago. Her smoking use included cigarettes. She has never used smokeless tobacco. She reports that she does not drink alcohol and does not use drugs.   Family History:  The patient's family history includes Breast cancer (age of onset: 54) in her sister; Diabetes in her paternal uncle; Heart disease in her father; Lung cancer in her sister; Osteoporosis in her mother; Ovarian cancer in her maternal grandmother.  ROS:  Please  see the history of present illness.    All other systems are reviewed and otherwise negative.   PHYSICAL EXAM:  VS:  LMP 04/02/1988  BMI: There is no height or weight on file to calculate BMI. Well nourished, well developed, in no acute distress HEENT: normocephalic, atraumatic Neck: no JVD, carotid bruits or masses Cardiac:  *** RRR; no significant murmurs, no rubs, or gallops Lungs:  *** CTA b/l, no wheezing, rhonchi or rales Abd: soft, nontender MS: no deformity or *** atrophy Ext: *** no edema Skin: warm and dry, no rash Neuro:  No  gross deficits appreciated Psych: euthymic mood, full affect    EKG:  Done today and reviewed by myself shows  ***   04/21/2021: cardiac CT IMPRESSION: 1. There is normal pulmonary vein drainage into the left atrium with ostial measurements above. 2. There is no thrombus in the left atrial appendage on delayed imaging. 3. The esophagus runs in the left atrial midline until it comes in close proximity to left uper pulmonary vein ostium. 4. No PFO/ASD. 5. Normal coronary origin. Right dominance. 6. CAC score of 223 which is 66th percentile for age-, race-, and sex-matched controls.   Echo 02/20/2021: 1. Left ventricular ejection fraction, by estimation, is 55 to 60%. The  left ventricle has normal function. The left ventricle has no regional  wall motion abnormalities. Left ventricular diastolic parameters were  normal. The average left ventricular  global longitudinal strain is -25.6 %. The global longitudinal strain is  normal.   2. Right ventricular systolic function is normal. The right ventricular  size is normal.   3. Left atrial size was moderately dilated.   4. Likely PFO present . Evidence of atrial level shunting detected by  color flow Doppler.   5. Right atrial size was moderately dilated.   6. The mitral valve is abnormal. Trivial mitral valve regurgitation. No  evidence of mitral stenosis.   7. The aortic valve is  tricuspid. There is mild calcification of the  aortic valve. Aortic valve regurgitation is not visualized. Aortic valve  sclerosis is present, with no evidence of aortic valve stenosis.   8. The inferior vena cava is normal in size with greater than 50%  respiratory variability, suggesting right atrial pressure of 3 mmHg.    Myocardial perfusion imaging 12/07/20:   The study is normal. The study is low risk.   LV perfusion is normal. There is no evidence of ischemia. There is no evidence of infarction.   Left ventricular function is normal. Nuclear stress EF: 55 %. The left ventricular ejection fraction is normal (55-65%). End diastolic cavity size is normal.   Prior study available for comparison from 02/19/2005. No changes compared to prior study. No change from previous study 2006 Normal resting and stress perfusion. No ischemia or infarction EF 55% no change from study done 2006  Recent Labs: 05/30/2021: BUN 17; Creatinine, Ser 0.71; Hemoglobin 15.6; Platelets 262; Potassium 3.6; Sodium 136  No results found for requested labs within last 8760 hours.   CrCl cannot be calculated (Patient's most recent lab result is older than the maximum 21 days allowed.).   Wt Readings from Last 3 Encounters:  07/21/21 166 lb 3.2 oz (75.4 kg)  07/12/21 162 lb (73.5 kg)  06/13/21 167 lb 12.8 oz (76.1 kg)     Other studies reviewed: Additional studies/records reviewed today include: summarized above  ASSESSMENT AND PLAN:  Persistent AFib Atypical Aflutter CHA2DS2Vasc is 4, on Eliquis, *** appropriately dosed *** amiodarone *** labs ***  HTN ***  Chronic CHF (diastolic) ***   Disposition: F/u with ***  Current medicines are reviewed at length with the patient today.  The patient did not have any concerns regarding medicines.  Venetia Night, PA-C 08/31/2021 8:06 AM     [facility] [address] [place] [address] [phone] (office)  [phone] (fax)

## 2021-09-01 ENCOUNTER — Encounter: Payer: Self-pay | Admitting: Physician Assistant

## 2021-09-01 ENCOUNTER — Ambulatory Visit (INDEPENDENT_AMBULATORY_CARE_PROVIDER_SITE_OTHER): Payer: Medicare Other | Admitting: Physician Assistant

## 2021-09-01 VITALS — BP 114/68 | HR 54 | Ht 65.0 in | Wt 165.0 lb

## 2021-09-01 DIAGNOSIS — I5032 Chronic diastolic (congestive) heart failure: Secondary | ICD-10-CM

## 2021-09-01 DIAGNOSIS — I10 Essential (primary) hypertension: Secondary | ICD-10-CM | POA: Diagnosis not present

## 2021-09-01 DIAGNOSIS — I484 Atypical atrial flutter: Secondary | ICD-10-CM | POA: Diagnosis not present

## 2021-09-01 DIAGNOSIS — I4819 Other persistent atrial fibrillation: Secondary | ICD-10-CM | POA: Diagnosis not present

## 2021-09-01 LAB — COMPREHENSIVE METABOLIC PANEL
ALT: 26 IU/L (ref 0–32)
AST: 29 IU/L (ref 0–40)
Albumin/Globulin Ratio: 1.9 (ref 1.2–2.2)
Albumin: 4.4 g/dL (ref 3.7–4.7)
Alkaline Phosphatase: 91 IU/L (ref 44–121)
BUN/Creatinine Ratio: 16 (ref 12–28)
BUN: 14 mg/dL (ref 8–27)
Bilirubin Total: 1.2 mg/dL (ref 0.0–1.2)
CO2: 27 mmol/L (ref 20–29)
Calcium: 9.8 mg/dL (ref 8.7–10.3)
Chloride: 92 mmol/L — ABNORMAL LOW (ref 96–106)
Creatinine, Ser: 0.88 mg/dL (ref 0.57–1.00)
Globulin, Total: 2.3 g/dL (ref 1.5–4.5)
Glucose: 114 mg/dL — ABNORMAL HIGH (ref 70–99)
Potassium: 4 mmol/L (ref 3.5–5.2)
Sodium: 132 mmol/L — ABNORMAL LOW (ref 134–144)
Total Protein: 6.7 g/dL (ref 6.0–8.5)
eGFR: 66 mL/min/{1.73_m2} (ref >59)

## 2021-09-01 LAB — CBC
Hematocrit: 48.6 % — ABNORMAL HIGH (ref 34.0–46.6)
Hemoglobin: 16.1 g/dL — ABNORMAL HIGH (ref 11.1–15.9)
MCH: 27.5 pg (ref 26.6–33.0)
MCHC: 33.1 g/dL (ref 31.5–35.7)
MCV: 83 fL (ref 79–97)
Platelets: 256 10*3/uL (ref 150–450)
RBC: 5.86 x10E6/uL — ABNORMAL HIGH (ref 3.77–5.28)
RDW: 14.6 % (ref 11.7–15.4)
WBC: 5.6 10*3/uL (ref 3.4–10.8)

## 2021-09-01 LAB — TSH: TSH: 8.18 u[IU]/mL — ABNORMAL HIGH (ref 0.450–4.500)

## 2021-09-01 NOTE — Patient Instructions (Addendum)
Medication Instructions:     STOP TAKING AND REMOVE THIS MEDICATION FROM YOUR MEDICATION LIST: TOPROL XL     *If you need a refill on your cardiac medications before your next appointment, please call your pharmacy*   Lab Work: CMET  CBC AND TSH TODAY   If you have labs (blood work) drawn today and your tests are completely normal, you will receive your results only by: [place] (if you have MyChart) OR A paper copy in the mail If you have any lab test that is abnormal or we need to change your treatment, we will call you to review the results.   Testing/Procedures: NONE ORDERED  TODAY    Follow-Up: At [facility], you and your health needs are our priority.  As part of our continuing mission to provide you with exceptional heart care, we have created designated Provider Care Teams.  These Care Teams include your primary Cardiologist (physician) and Advanced Practice Providers (APPs -  Physician Assistants and Nurse Practitioners) who all work together to provide you with the care you need, when you need it.  We recommend signing up for the patient portal called "MyChart".  Sign up information is provided on this After Visit Summary.  MyChart is used to connect with patients for Virtual Visits (Telemedicine).  Patients are able to view lab/test results, encounter notes, upcoming appointments, etc.  Non-urgent messages can be sent to your provider as well.   To learn more about what you can do with MyChart, go to [email or web address].    Your next appointment:    3 month(s)  The format for your next appointment:   In Person  Provider:   [name], MD    Dr. Quentin Ore and Tommye Standard: in 6 MONTHS    Other Instructions   Important Information About Sugar

## 2021-09-06 ENCOUNTER — Other Ambulatory Visit: Payer: Self-pay | Admitting: *Deleted

## 2021-09-06 ENCOUNTER — Telehealth: Payer: Self-pay | Admitting: *Deleted

## 2021-09-06 DIAGNOSIS — Z79899 Other long term (current) drug therapy: Secondary | ICD-10-CM

## 2021-09-06 NOTE — Telephone Encounter (Signed)
Spoke with patient about results and verbalized understanding. Patient scheduled for labs 09-27-21

## 2021-09-06 NOTE — Telephone Encounter (Signed)
-----   Message from [place], [place] sent at 09/05/2021  2:14 PM EDT ----- Labs mostly look ok, her TSH is a little high, though may improve at the lower dose of the amiodarone. Please repeat TSH with T3/T4 in the next couple weeks please

## 2021-09-07 ENCOUNTER — Other Ambulatory Visit: Payer: Self-pay | Admitting: Cardiovascular Disease

## 2021-09-07 ENCOUNTER — Other Ambulatory Visit: Payer: Self-pay | Admitting: Internal Medicine

## 2021-09-11 ENCOUNTER — Ambulatory Visit (INDEPENDENT_AMBULATORY_CARE_PROVIDER_SITE_OTHER): Payer: Medicare Other | Admitting: Internal Medicine

## 2021-09-11 ENCOUNTER — Encounter: Payer: Self-pay | Admitting: Internal Medicine

## 2021-09-11 VITALS — BP 110/72 | HR 61 | Ht 65.5 in | Wt 163.0 lb

## 2021-09-11 DIAGNOSIS — K219 Gastro-esophageal reflux disease without esophagitis: Secondary | ICD-10-CM

## 2021-09-11 DIAGNOSIS — K580 Irritable bowel syndrome with diarrhea: Secondary | ICD-10-CM | POA: Diagnosis not present

## 2021-09-11 MED ORDER — HYOSCYAMINE SULFATE ER 0.375 MG PO TB12: 0.3750 mg | ORAL_TABLET | Freq: Every morning | ORAL | 3 refills | Status: DC

## 2021-09-11 NOTE — Patient Instructions (Signed)
If you are age 80 or older, your body mass index should be between 23-30. Your Body mass index is 26.71 kg/m. If this is out of the aforementioned range listed, please consider follow up with your Primary Care Provider.  If you are age 80 or younger, your body mass index should be between 19-25. Your Body mass index is 26.71 kg/m. If this is out of the aformentioned range listed, please consider follow up with your Primary Care Provider.   ________________________________________________________  The [place] [facility] providers would like to encourage you to use [facility] to communicate with providers for non-urgent requests or questions.  Due to long hold times on the telephone, sending your provider a message by [facility] may be a faster and more efficient way to get a response.  Please allow 48 business hours for a response.  Please remember that this is for non-urgent requests.  _______________________________________________________  We have sent the following medications to your pharmacy for you to pick up at your convenience:  Levbid  Please follow up in one year

## 2021-09-11 NOTE — Progress Notes (Signed)
HISTORY OF PRESENT ILLNESS:  Brittney Fox is a 81 y.o. female with past medical history as listed below including atrial arrhythmia for which she is on Eliquis.  She was last evaluated in the office in April 2023 regarding diarrhea predominant irritable bowel syndrome manifested by intermittent urgency with diarrhea and occasional incontinent episodes.  She was prescribed Levbid, IBgard, and antidiarrheals as needed.  She presents today for follow-up.  She tells me that she is doing great.  Her bowel habits have regulated taking Levbid once each morning.  She did take IBgard for a few weeks.  She continues on PPI for GERD.  No new complaints.  She is now on amiodarone.  REVIEW OF SYSTEMS:  All non-GI ROS negative.  Past Medical History:  Diagnosis Date   Anal fissure    as a child   Blood transfusion without reported diagnosis    Diabetes mellitus    managed by diet and exercise   Diverticulosis of colon (without mention of hemorrhage)    Fatty liver    GERD (gastroesophageal reflux disease)    Heart murmur    Hyperlipidemia    Irritable bowel syndrome    Lymphocytic colitis    chronic   Neuromuscular disorder ([place])    neuropathy   Osteoarthritis    Osteopenia 09/2007   Other mucopurulent conjunctivitis    Premature ventricular contractions    Raynaud's syndrome    Rosacea    opitcal   Spinal stenosis     Past Surgical History:  Procedure Laterality Date   ATRIAL FIBRILLATION ABLATION N/A 04/24/2021   Procedure: ATRIAL FIBRILLATION ABLATION;  Surgeon: Vickie Epley, MD;  Location: [place] [facility];  Service: Cardiovascular;  Laterality: N/A;   CATARACT EXTRACTION Bilateral    CHOLECYSTECTOMY     COLONOSCOPY     dental implants     DILATION AND CURETTAGE OF UTERUS  1990   FOOT SURGERY     HIP SURGERY Bilateral 1973   Jaw Implants  2007   2018.2019   KNEE ARTHROSCOPY Right    lens implant     ovarian wedge resection  1964   REPLACEMENT TOTAL KNEE  Bilateral 2005, 2007   TOTAL ABDOMINAL HYSTERECTOMY W/ BILATERAL SALPINGOOPHORECTOMY  1990   Excessive Bleeding    TOTAL KNEE ARTHROPLASTY Left 08/2003   TOTAL KNEE ARTHROPLASTY Right 08/2005    Social History Brittney Fox  reports that she quit smoking about 59 years ago. Her smoking use included cigarettes. She has never used smokeless tobacco. She reports that she does not drink alcohol and does not use drugs.  family history includes Breast cancer (age of onset: 27) in her sister; Diabetes in her paternal uncle; Heart disease in her father; Lung cancer in her sister; Osteoporosis in her mother; Ovarian cancer in her maternal grandmother.  Allergies  Allergen Reactions   Amoxicillin Other (See Comments)    Ulcers and thrush   Codeine Phosphate     headache   Metformin Hcl Other (See Comments)   Morphine Sulfate Swelling    tongue swelling   Sitagliptin Other (See Comments)       PHYSICAL EXAMINATION: Vital signs: BP 110/72   Pulse 61   Ht 5' 5.5" (1.664 m)   Wt 163 lb (73.9 kg)   LMP 04/02/1988   BMI 26.71 kg/m   Constitutional: generally well-appearing, no acute distress Psychiatric: alert and oriented x3, cooperative Eyes: extraocular movements intact, anicteric, conjunctiva pink Mouth: oral pharynx moist, no lesions  Neck: supple no lymphadenopathy Cardiovascular: heart regular rate and rhythm, no murmur Lungs: clear to auscultation bilaterally Abdomen: soft, nontender, nondistended, no obvious ascites, no peritoneal signs, normal bowel sounds, no organomegaly Rectal: Omitted Extremities: no clubbing or cyanosis.  1+ lower extremity edema bilaterally Skin: no lesions on visible extremities Neuro: No focal deficits.  Cranial nerves intact  ASSESSMENT:  1.  Diarrhea predominant irritable bowel syndrome.  Improved on Levbid once daily 2.  Prior colonoscopy and upper endoscopy 2021 3.  GERD.  Symptoms controlled with PPI  PLAN:  1.  Continue Levbid.   Prescription refilled.  Medication risk reviewed. 2.  Reflux precautions 3.  Continue PPI 4.  Routine GI follow-up 1 year

## 2021-09-13 DIAGNOSIS — M7521 Bicipital tendinitis, right shoulder: Secondary | ICD-10-CM | POA: Diagnosis not present

## 2021-09-27 ENCOUNTER — Other Ambulatory Visit: Payer: Medicare Other

## 2021-09-27 DIAGNOSIS — Z79899 Other long term (current) drug therapy: Secondary | ICD-10-CM

## 2021-09-28 LAB — TSH: TSH: 7.18 u[IU]/mL — ABNORMAL HIGH (ref 0.450–4.500)

## 2021-09-28 LAB — T3, FREE: T3, Free: 2.3 pg/mL (ref 2.0–4.4)

## 2021-09-28 LAB — T4, FREE: Free T4: 0.78 ng/dL — ABNORMAL LOW (ref 0.82–1.77)

## 2021-10-02 ENCOUNTER — Other Ambulatory Visit: Payer: Self-pay

## 2021-10-02 ENCOUNTER — Telehealth: Payer: Self-pay | Admitting: Physician Assistant

## 2021-10-02 DIAGNOSIS — Z79899 Other long term (current) drug therapy: Secondary | ICD-10-CM

## 2021-10-02 NOTE — Telephone Encounter (Signed)
Follow Up:       Patient would like her lab results from next week.

## 2021-10-05 DIAGNOSIS — R300 Dysuria: Secondary | ICD-10-CM | POA: Diagnosis not present

## 2021-10-05 DIAGNOSIS — R350 Frequency of micturition: Secondary | ICD-10-CM | POA: Diagnosis not present

## 2021-10-06 ENCOUNTER — Ambulatory Visit (HOSPITAL_COMMUNITY)
Admission: RE | Admit: 2021-10-06 | Discharge: 2021-10-06 | Disposition: A | Discharge status: Home / Self Care | Payer: Medicare Other | Source: Ambulatory Visit | Attending: Nurse Practitioner | Admitting: Nurse Practitioner

## 2021-10-06 DIAGNOSIS — I480 Paroxysmal atrial fibrillation: Secondary | ICD-10-CM

## 2021-10-12 ENCOUNTER — Telehealth: Payer: Self-pay | Admitting: Physician Assistant

## 2021-10-12 NOTE — Telephone Encounter (Signed)
Stop the amiodarone if she has developed a tremor.    Without the medicine we will likely run into her afib again, please let us know if she has recurrent Afib.   Monitor BP and HR please and let us know how they look as well.  I will have Dr. Mardene Speak scheduler call her to make an appointment to come in to see him/us in the next week or so.   Tommye Standard, PA-C    Pt advised and will monitor her symptoms and let us know how she is doing.

## 2021-10-12 NOTE — Telephone Encounter (Signed)
Pt called to talk with Tommye Standard PA and report that she saw her Endocrinologist Dr Berniece Salines at [facility] recently and advised her of her recent abnormal thyroid tests here at our office and she was concerned since the pt says she is symptomatic with a tremor... and notes she should be on medication... I advised her that we are monitoring closely and may be a result of her Amiodarone and they are planning to repeat at her OV with Dr Burt Knack 12/2021... I will forward to Tommye Standard for her review and I will forward to her Endocrinologist after Joseph Art has a chance to review and if she recommends we need to have Endocrine helping to manage.

## 2021-10-12 NOTE — Telephone Encounter (Signed)
Patient is calling about her thyroid issue, her endocrinologist advised her to call back to see what can be done. Patient states she is feeling nervous and feels weak in her hands.

## 2021-10-12 NOTE — Telephone Encounter (Signed)
I called the pt per [facility] and the pt reports that her tremor is new for her.. worse in then morning and improves as the day goes on... she says it started 1-2 weeks ago and she also says that she advised Dr. Purcell Mouton (Endocrin) that she generally does not feel well.. she has felt weak.   Dr. Purcell Mouton has strongly urged her to call us and advised that she feels she needs med management.. but she did not discuss the issue of her taking Amiodarone.

## 2021-10-17 ENCOUNTER — Other Ambulatory Visit (HOSPITAL_BASED_OUTPATIENT_CLINIC_OR_DEPARTMENT_OTHER): Payer: Self-pay | Admitting: Obstetrics & Gynecology

## 2021-10-17 MED ORDER — SULFAMETHOXAZOLE-TRIMETHOPRIM 800-160 MG PO TABS: 1.0000 | ORAL_TABLET | Freq: Two times a day (BID) | ORAL | 0 refills | Status: AC

## 2021-10-18 NOTE — Progress Notes (Signed)
PCP:  Velna Hatchet, MD Primary Cardiologist: [name], MD Electrophysiologist: Vickie Epley, MD   Brittney Fox is a 81 y.o. female seen today for Vickie Epley, MD for routine electrophysiology followup.  Since last being seen in our clinic the patient reports doing OK from a cardiac standpoint. She has noted a mild, bilateral tremor that "comes and goes". She has also had some fluctuation in her thyroid levels, and her endocrinologist has asked for TFTS q 6 weeks x 5. She has not had any AF, palpitations, chest pain, SOB, dyspnea, or syncope.   Past Medical History:  Diagnosis Date   Anal fissure    as a child   Blood transfusion without reported diagnosis    Diabetes mellitus    managed by diet and exercise   Diverticulosis of colon (without mention of hemorrhage)    Fatty liver    GERD (gastroesophageal reflux disease)    Heart murmur    Hyperlipidemia    Irritable bowel syndrome    Lymphocytic colitis    chronic   Neuromuscular disorder ([place])    neuropathy   Osteoarthritis    Osteopenia 09/2007   Other mucopurulent conjunctivitis    Premature ventricular contractions    Raynaud's syndrome    Rosacea    opitcal   Spinal stenosis    Past Surgical History:  Procedure Laterality Date   ATRIAL FIBRILLATION ABLATION N/A 04/24/2021   Procedure: ATRIAL FIBRILLATION ABLATION;  Surgeon: Vickie Epley, MD;  Location: [facility];  Service: Cardiovascular;  Laterality: N/A;   CATARACT EXTRACTION Bilateral    CHOLECYSTECTOMY     COLONOSCOPY     dental implants     DILATION AND CURETTAGE OF UTERUS  1990   FOOT SURGERY     HIP SURGERY Bilateral 1973   Jaw Implants  2007   2018.2019   KNEE ARTHROSCOPY Right    lens implant     ovarian wedge resection  1964   REPLACEMENT TOTAL KNEE Bilateral 2005, 2007   TOTAL ABDOMINAL HYSTERECTOMY W/ BILATERAL SALPINGOOPHORECTOMY  1990   Excessive Bleeding    TOTAL KNEE ARTHROPLASTY Left 08/2003   TOTAL  KNEE ARTHROPLASTY Right 08/2005    Current Outpatient Medications  Medication Sig Dispense Refill   acetaminophen (TYLENOL) 500 MG tablet Take 500 mg by mouth every 6 (six) hours as needed for moderate pain.     amiodarone (PACERONE) 200 MG tablet Take 1 tablet (200 mg total) by mouth 2 (two) times daily for 14 days, THEN 1 tablet (200 mg total) daily. 90 tablet 3   BELSOMRA 10 MG TABS Take by mouth.     Cholecalciferol (VITAMIN D) 50 MCG (2000 UT) tablet Take 2,000 Units by mouth daily.     dicyclomine (BENTYL) 20 MG tablet TAKE ONE TABLET EVERY 6 HOURS AS NEEDED 90 tablet 0   diphenoxylate-atropine (LOMOTIL) 2.5-0.025 MG per tablet TAKE ONE (1) TABLET FOUR TIMES EACH DAY AS NEEDED FOR DIARRHEA (Patient taking differently: Take 1-2 tablets by mouth 4 (four) times daily as needed for diarrhea or loose stools. Alternating with imodium.) 30 tablet 0   ELIQUIS 5 MG TABS tablet TAKE ONE TABLET TWICE DAILY 60 tablet 5   fluocinonide cream (LIDEX) 4.09 % Apply 1 application. topically as needed.     glucose blood (ONETOUCH VERIO) test strip      hydrocortisone (ANUSOL-HC) 2.5 % rectal cream Place 1 application rectally at bedtime. 30 g 1   hyoscyamine (LEVBID) 0.375 MG  12 hr tablet Take 1 tablet (0.375 mg total) by mouth every morning. 90 tablet 3   JARDIANCE 10 MG TABS tablet Take 1 tablet by mouth daily.     loperamide (IMODIUM A-D) 2 MG tablet Take 2-4 mg by mouth 4 (four) times daily as needed for diarrhea or loose stools.     meclizine (ANTIVERT) 25 MG tablet Take 25 mg by mouth as needed.     naproxen sodium (ALEVE) 220 MG tablet Take 220-440 mg by mouth daily as needed (pain).     omeprazole (PRILOSEC) 40 MG capsule TAKE ONE CAPSULE EACH DAY 90 capsule 3   Polyvinyl Alcohol-Povidone (REFRESH OP) Place 1 drop into both eyes daily as needed (dry eyes).     potassium chloride (KLOR-CON) 10 MEQ tablet TAKE TWO TABLETS EVERY DAY 180 tablet 2   Probiotic Product (ALIGN PO) Take 1 capsule by mouth  daily.     Psyllium (METAMUCIL PO) Take 1 Dose by mouth daily as needed (constipation). 1 dose = 1 tablespoon     Pyridoxine HCl (B-6 PO) Take 1 tablet by mouth at bedtime.     spironolactone-hydrochlorothiazide (ALDACTAZIDE) 25-25 MG per tablet TAKE ONE TABLET EACH DAY 30 tablet 5   sulfamethoxazole-trimethoprim (BACTRIM DS) 800-160 MG tablet Take 1 tablet by mouth 2 (two) times daily for 5 days. 10 tablet 0   temazepam (RESTORIL) 15 MG capsule Take 15 mg by mouth as needed.     terconazole (TERAZOL 7) 0.4 % vaginal cream as needed.     vitamin B-12 (CYANOCOBALAMIN) 1000 MCG tablet Take 1,000 mcg by mouth daily.     No current facility-administered medications for this visit.    Allergies  Allergen Reactions   Amoxicillin Other (See Comments)    Ulcers and thrush   Codeine Phosphate     headache   Metformin Hcl Other (See Comments)   Morphine Sulfate Swelling    tongue swelling   Sitagliptin Other (See Comments)    Social History   Socioeconomic History   Marital status: Widowed    Spouse name: Not on file   Number of children: 2   Years of education: Not on file   Highest education level: Not on file  Occupational History   Occupation: REAL ESTATE    Employer: [employer]  Tobacco Use   Smoking status: Former    Types: Cigarettes    Quit date: 10/22/1961    Years since quitting: 60.0   Smokeless tobacco: Never  Vaping Use   Vaping Use: Never used  Substance and Sexual Activity   Alcohol use: No    Alcohol/week: 0.0 standard drinks of alcohol   Drug use: No   Sexual activity: Not Currently    Partners: Male    Birth control/protection: Surgical    Comment: hysterectomy  Other Topics Concern   Not on file  Social History Narrative   Not on file   Social Determinants of Health   Financial Resource Strain: Not on file  Food Insecurity: Not on file  Transportation Needs: Not on file  Physical Activity: Not on file  Stress: Not on file   Social Connections: Not on file  Intimate Partner Violence: Not on file     Review of Systems: All other systems reviewed and are otherwise negative except as noted above.  Physical Exam: Vitals:   10/19/21 0812  BP: 120/80  Pulse: 66  SpO2: 98%  Weight: 160 lb (72.6 kg)  Height: '5\' 5"'$  (1.651 m)  GEN- The patient is well appearing, alert and oriented x 3 today.   HEENT: normocephalic, atraumatic; sclera clear, conjunctiva pink; hearing intact; oropharynx clear; neck supple, no JVP Lymph- no cervical lymphadenopathy Lungs- Clear to ausculation bilaterally, normal work of breathing.  No wheezes, rales, rhonchi Heart- Regular rate and rhythm, no murmurs, rubs or gallops, PMI not laterally displaced GI- soft, non-tender, non-distended, bowel sounds present, no hepatosplenomegaly Extremities- no clubbing, cyanosis, or edema; DP/PT/radial pulses 2+ bilaterally MS- no significant deformity or atrophy Skin- warm and dry, no rash or lesion Psych- euthymic mood, full affect Neuro- strength and sensation are intact  EKG is ordered. Personal review of EKG from today shows NSR at 64 bpm  Additional studies reviewed include: Previous EP office notes.   Assessment and Plan:  Persistent AFib Atypical Aflutter CHA2DS2Vasc is 4, on Eliquis, appropriately dosed She has converted with amiodarone, but has developed a tremor and changes her in thyroid function. She follows with endocrinology who recommended monitoring now and again in 6 weeks to determine need for thryoid treatment.  Long discuss of options today with patient. Including stopping amiodarone with or without intention to transition to tikosyn once washed out, or decrease amiodarone and continue to monitor symptoms and TFTs for now.  Pt is overall feeling OK, and would like to try and first decrease amiodarone. We will decrease to 200 mg Monday-Friday (She wishes not to cut any tablets in half.)  HTN Stable on current regimen     Chronic CHF (diastolic) Volume status stable on exam.   Follow up with EP APP in  6 weeks  to re-assess symptoms and TFTs on continued amiodarone. Consider stopping at that point if symptoms have not improved and/or TFTs are dysfunctional.    Shirley Friar, PA-C  10/18/21 11:59 AM

## 2021-10-19 ENCOUNTER — Ambulatory Visit (INDEPENDENT_AMBULATORY_CARE_PROVIDER_SITE_OTHER): Payer: Medicare Other | Admitting: Student

## 2021-10-19 ENCOUNTER — Encounter: Payer: Self-pay | Admitting: Student

## 2021-10-19 VITALS — BP 120/80 | HR 66 | Ht 65.0 in | Wt 160.0 lb

## 2021-10-19 DIAGNOSIS — I5032 Chronic diastolic (congestive) heart failure: Secondary | ICD-10-CM | POA: Diagnosis not present

## 2021-10-19 DIAGNOSIS — Z79899 Other long term (current) drug therapy: Secondary | ICD-10-CM

## 2021-10-19 DIAGNOSIS — I480 Paroxysmal atrial fibrillation: Secondary | ICD-10-CM | POA: Diagnosis not present

## 2021-10-19 DIAGNOSIS — I10 Essential (primary) hypertension: Secondary | ICD-10-CM

## 2021-10-19 LAB — COMPREHENSIVE METABOLIC PANEL
ALT: 24 IU/L (ref 0–32)
AST: 33 IU/L (ref 0–40)
Albumin/Globulin Ratio: 2 (ref 1.2–2.2)
Albumin: 4.5 g/dL (ref 3.8–4.8)
Alkaline Phosphatase: 87 IU/L (ref 44–121)
BUN/Creatinine Ratio: 12 (ref 12–28)
BUN: 10 mg/dL (ref 8–27)
Bilirubin Total: 0.7 mg/dL (ref 0.0–1.2)
CO2: 24 mmol/L (ref 20–29)
Calcium: 10 mg/dL (ref 8.7–10.3)
Chloride: 89 mmol/L — ABNORMAL LOW (ref 96–106)
Creatinine, Ser: 0.82 mg/dL (ref 0.57–1.00)
Globulin, Total: 2.3 g/dL (ref 1.5–4.5)
Glucose: 95 mg/dL (ref 70–99)
Potassium: 4 mmol/L (ref 3.5–5.2)
Sodium: 130 mmol/L — ABNORMAL LOW (ref 134–144)
Total Protein: 6.8 g/dL (ref 6.0–8.5)
eGFR: 72 mL/min/{1.73_m2} (ref >59)

## 2021-10-19 LAB — TSH: TSH: 6.92 u[IU]/mL — ABNORMAL HIGH (ref 0.450–4.500)

## 2021-10-19 LAB — T4, FREE: Free T4: 0.78 ng/dL — ABNORMAL LOW (ref 0.82–1.77)

## 2021-10-19 LAB — T3, FREE: T3, Free: 2.1 pg/mL (ref 2.0–4.4)

## 2021-10-19 MED ORDER — AMIODARONE HCL 200 MG PO TABS: ORAL_TABLET | ORAL | 3 refills | Status: DC

## 2021-10-19 NOTE — Patient Instructions (Signed)
Medication Instructions:  Your physician has recommended you make the following change in your medication:   DECREASE: Amiodarone to '200mg'$  daily on Monday - Friday  *If you need a refill on your cardiac medications before your next appointment, please call your pharmacy*   Lab Work: TODAY: CMET, TSH, FreeT4, FreeT3  If you have labs (blood work) drawn today and your tests are completely normal, you will receive your results only by: [place] (if you have MyChart) OR A paper copy in the mail If you have any lab test that is abnormal or we need to change your treatment, we will call you to review the results.   Follow-Up: At [facility], you and your health needs are our priority.  As part of our continuing mission to provide you with exceptional heart care, we have created designated Provider Care Teams.  These Care Teams include your primary Cardiologist (physician) and Advanced Practice Providers (APPs -  Physician Assistants and Nurse Practitioners) who all work together to provide you with the care you need, when you need it.  Your next appointment:   As scheduled

## 2021-10-20 ENCOUNTER — Other Ambulatory Visit: Payer: Self-pay

## 2021-10-20 DIAGNOSIS — I480 Paroxysmal atrial fibrillation: Secondary | ICD-10-CM

## 2021-10-26 DIAGNOSIS — R351 Nocturia: Secondary | ICD-10-CM | POA: Diagnosis not present

## 2021-10-26 DIAGNOSIS — N390 Urinary tract infection, site not specified: Secondary | ICD-10-CM | POA: Diagnosis not present

## 2021-10-26 DIAGNOSIS — R350 Frequency of micturition: Secondary | ICD-10-CM | POA: Diagnosis not present

## 2021-11-07 ENCOUNTER — Encounter: Payer: Self-pay | Admitting: Cardiovascular Disease

## 2021-11-07 ENCOUNTER — Ambulatory Visit (INDEPENDENT_AMBULATORY_CARE_PROVIDER_SITE_OTHER): Payer: Medicare Other | Admitting: Cardiovascular Disease

## 2021-11-07 VITALS — BP 128/80 | HR 73 | Ht 65.0 in | Wt 157.0 lb

## 2021-11-07 DIAGNOSIS — I480 Paroxysmal atrial fibrillation: Secondary | ICD-10-CM

## 2021-11-07 DIAGNOSIS — I5032 Chronic diastolic (congestive) heart failure: Secondary | ICD-10-CM | POA: Diagnosis not present

## 2021-11-07 DIAGNOSIS — Z79899 Other long term (current) drug therapy: Secondary | ICD-10-CM

## 2021-11-07 DIAGNOSIS — I10 Essential (primary) hypertension: Secondary | ICD-10-CM

## 2021-11-07 LAB — TSH: TSH: 7.82 u[IU]/mL — ABNORMAL HIGH (ref 0.450–4.500)

## 2021-11-07 NOTE — Progress Notes (Signed)
Cardiology Office Note:    Date:  11/07/2021   ID:  Brittney Fox, DOB 12-30-1940, MRN [id number]  PCP:  Velna Hatchet, [place] [facility] Cardiologist:  [name], MD Electrophysiologist:  Vickie Epley, MD     Referring MD: Velna Hatchet, MD   Chief Complaint  Patient presents with   Atrial Fibrillation    History of Present Illness:    Brittney Fox is a 81 y.o. female with a hx of persistent atrial fibrillation, hypertension, and chronic diastolic heart failure.  The patient underwent A-fib ablation in January of this year.  She developed recurrent atrial flutter and after review of potential treatment options, was started on amiodarone.  She has not tolerated amiodarone well because of abnormal thyroid function and resting tremor.  This was recently discontinued a few weeks ago.  She actually is feeling quite well now.  She denies chest pain, palpitations, orthopnea, or PND.  She has only very mild shortness of breath with activity.  Overall she feels that she is doing well at present.  Past Medical History:  Diagnosis Date   Anal fissure    as a child   Blood transfusion without reported diagnosis    Diabetes mellitus    managed by diet and exercise   Diverticulosis of colon (without mention of hemorrhage)    Fatty liver    GERD (gastroesophageal reflux disease)    Heart murmur    Hyperlipidemia    Irritable bowel syndrome    Lymphocytic colitis    chronic   Neuromuscular disorder ([place])    neuropathy   Osteoarthritis    Osteopenia 09/2007   Other mucopurulent conjunctivitis    Premature ventricular contractions    Raynaud's syndrome    Rosacea    opitcal   Spinal stenosis     Past Surgical History:  Procedure Laterality Date   ATRIAL FIBRILLATION ABLATION N/A 04/24/2021   Procedure: ATRIAL FIBRILLATION ABLATION;  Surgeon: Vickie Epley, MD;  Location: [facility];  Service: Cardiovascular;  Laterality:  N/A;   CATARACT EXTRACTION Bilateral    CHOLECYSTECTOMY     COLONOSCOPY     dental implants     DILATION AND CURETTAGE OF UTERUS  1990   FOOT SURGERY     HIP SURGERY Bilateral 1973   Jaw Implants  2007   2018.2019   KNEE ARTHROSCOPY Right    lens implant     ovarian wedge resection  1964   REPLACEMENT TOTAL KNEE Bilateral 2005, 2007   TOTAL ABDOMINAL HYSTERECTOMY W/ BILATERAL SALPINGOOPHORECTOMY  1990   Excessive Bleeding    TOTAL KNEE ARTHROPLASTY Left 08/2003   TOTAL KNEE ARTHROPLASTY Right 08/2005    Current Medications: Current Meds  Medication Sig   acetaminophen (TYLENOL) 500 MG tablet Take 500 mg by mouth every 6 (six) hours as needed for moderate pain.   BELSOMRA 10 MG TABS Take by mouth.   Cholecalciferol (VITAMIN D) 50 MCG (2000 UT) tablet Take 2,000 Units by mouth daily.   dicyclomine (BENTYL) 20 MG tablet TAKE ONE TABLET EVERY 6 HOURS AS NEEDED   diphenoxylate-atropine (LOMOTIL) 2.5-0.025 MG per tablet TAKE ONE (1) TABLET FOUR TIMES EACH DAY AS NEEDED FOR DIARRHEA (Patient taking differently: Take 1-2 tablets by mouth 4 (four) times daily as needed for diarrhea or loose stools. Alternating with imodium.)   ELIQUIS 5 MG TABS tablet TAKE ONE TABLET TWICE DAILY   fluocinonide cream (LIDEX) 9.56 % Apply 1 application. topically  as needed.   glucose blood (ONETOUCH VERIO) test strip    hydrocortisone (ANUSOL-HC) 2.5 % rectal cream Place 1 application rectally at bedtime.   hyoscyamine (LEVBID) 0.375 MG 12 hr tablet Take 1 tablet (0.375 mg total) by mouth every morning.   JARDIANCE 10 MG TABS tablet Take 1 tablet by mouth daily.   loperamide (IMODIUM A-D) 2 MG tablet Take 2-4 mg by mouth 4 (four) times daily as needed for diarrhea or loose stools.   meclizine (ANTIVERT) 25 MG tablet Take 25 mg by mouth as needed.   naproxen sodium (ALEVE) 220 MG tablet Take 220-440 mg by mouth daily as needed (pain).   omeprazole (PRILOSEC) 40 MG capsule TAKE ONE CAPSULE EACH DAY   Polyvinyl  Alcohol-Povidone (REFRESH OP) Place 1 drop into both eyes daily as needed (dry eyes).   potassium chloride (KLOR-CON) 10 MEQ tablet TAKE TWO TABLETS EVERY DAY   Probiotic Product (ALIGN PO) Take 1 capsule by mouth daily.   Psyllium (METAMUCIL PO) Take 1 Dose by mouth daily as needed (constipation). 1 dose = 1 tablespoon   Pyridoxine HCl (B-6 PO) Take 1 tablet by mouth at bedtime.   spironolactone-hydrochlorothiazide (ALDACTAZIDE) 25-25 MG per tablet TAKE ONE TABLET EACH DAY   temazepam (RESTORIL) 15 MG capsule Take 15 mg by mouth as needed.   terconazole (TERAZOL 7) 0.4 % vaginal cream as needed.   vitamin B-12 (CYANOCOBALAMIN) 1000 MCG tablet Take 1,000 mcg by mouth daily.     Allergies:   Morphine sulfate, Amoxicillin, Codeine phosphate, Metformin hcl, and Sitagliptin   Social History   Socioeconomic History   Marital status: Widowed    Spouse name: Not on file   Number of children: 2   Years of education: Not on file   Highest education level: Not on file  Occupational History   Occupation: REAL ESTATE    Employer: [employer]  Tobacco Use   Smoking status: Former    Types: Cigarettes    Quit date: 10/22/1961    Years since quitting: 60.0   Smokeless tobacco: Never  Vaping Use   Vaping Use: Never used  Substance and Sexual Activity   Alcohol use: No    Alcohol/week: 0.0 standard drinks of alcohol   Drug use: No   Sexual activity: Not Currently    Partners: Male    Birth control/protection: Surgical    Comment: hysterectomy  Other Topics Concern   Not on file  Social History Narrative   Not on file   Social Determinants of Health   Financial Resource Strain: Not on file  Food Insecurity: Not on file  Transportation Needs: Not on file  Physical Activity: Not on file  Stress: Not on file  Social Connections: Not on file     Family History: The patient's family history includes Breast cancer (age of onset: 43) in her sister; Diabetes in her  paternal uncle; Heart disease in her father; Lung cancer in her sister; Osteoporosis in her mother; Ovarian cancer in her maternal grandmother. There is no history of Colon cancer, Stomach cancer, Esophageal cancer, or Rectal cancer.  ROS:   Please see the history of present illness.    All other systems reviewed and are negative.  EKGs/Labs/Other Studies Reviewed:    The following studies were reviewed today: 2D echocardiogram 02/20/2021: 1. Left ventricular ejection fraction, by estimation, is 55 to 60%. The  left ventricle has normal function. The left ventricle has no regional  wall motion abnormalities. Left ventricular  diastolic parameters were  normal. The average left ventricular  global longitudinal strain is -25.6 %. The global longitudinal strain is  normal.   2. Right ventricular systolic function is normal. The right ventricular  size is normal.   3. Left atrial size was moderately dilated.   4. Likely PFO present . Evidence of atrial level shunting detected by  color flow Doppler.   5. Right atrial size was moderately dilated.   6. The mitral valve is abnormal. Trivial mitral valve regurgitation. No  evidence of mitral stenosis.   7. The aortic valve is tricuspid. There is mild calcification of the  aortic valve. Aortic valve regurgitation is not visualized. Aortic valve  sclerosis is present, with no evidence of aortic valve stenosis.   8. The inferior vena cava is normal in size with greater than 50%  respiratory variability, suggesting right atrial pressure of 3 mmHg.   EKG:  EKG is not ordered today.    Recent Labs: 09/01/2021: Hemoglobin 16.1; Platelets 256 10/19/2021: ALT 24; BUN 10; Creatinine, Ser 0.82; Potassium 4.0; Sodium 130; TSH 6.920  Recent Lipid Panel    Component Value Date/Time   CHOL 144 09/13/2015 0734   TRIG 87 09/13/2015 0734   TRIG 88 02/06/2006 1028   HDL 59 09/13/2015 0734   CHOLHDL 2.4 09/13/2015 0734   VLDL 17 09/13/2015 0734   LDLCALC  68 09/13/2015 0734   LDLDIRECT 150.2 11/28/2010 1131     Risk Assessment/Calculations:    CHA2DS2-VASc Score = 3   This indicates a 3.2% annual risk of stroke. The patient's score is based upon: CHF History: 0 HTN History: 0 Diabetes History: 0 Stroke History: 0 Vascular Disease History: 0 Age Score: 2 Gender Score: 1          Physical Exam:    VS:  BP 128/80   Pulse 73   Ht '5\' 5"'$  (1.651 m)   Wt 157 lb (71.2 kg)   LMP 04/02/1988   SpO2 98%   BMI 26.13 kg/m     Wt Readings from Last 3 Encounters:  11/07/21 157 lb (71.2 kg)  10/19/21 160 lb (72.6 kg)  09/11/21 163 lb (73.9 kg)     GEN:  Well nourished, well developed in no acute distress HEENT: Normal NECK: No JVD; No carotid bruits LYMPHATICS: No lymphadenopathy CARDIAC: RRR, no murmurs, rubs, gallops RESPIRATORY:  Clear to auscultation without rales, wheezing or rhonchi  ABDOMEN: Soft, non-tender, non-distended MUSCULOSKELETAL: Trace bilateral pretibial edema; No deformity  SKIN: Warm and dry with changes of chronic venous insufficiency NEUROLOGIC:  Alert and oriented x 3 PSYCHIATRIC:  Normal affect   ASSESSMENT:    1. Paroxysmal atrial fibrillation (HCC)   2. Essential hypertension   3. Chronic diastolic congestive heart failure ([place])   4. Medication management    PLAN:    In order of problems listed above:  Appears to be maintaining sinus rhythm, but has only been off of amiodarone for about 2 weeks.  She understands that she has a high risk of recurrence.  The question will be whether she requires a continued focus on rhythm control which would likely mean initiation of Tikosyn.  The other option would be to rate control her when she has a recurrence of atrial fibrillation.  We will just have to see how she does from a symptomatic perspective.  She will remain on apixaban for anticoagulation.  She has follow-up in the atrial fibrillation clinic next month.  There is a follow-up scheduled with  me in  October which I will leave on the books.  Otherwise continue current management.  Will check a TSH today to follow her thyroid function. Blood pressure is controlled on spironolactone/HCTZ. Appears stable.  Continue current therapy.  Minimally symptomatic at present. As above, no changes are made.  We discussed considerations around initiation of Tikosyn if needed.  She has follow-up scheduled with Roderic Palau in the atrial fibrillation clinic next month.           Medication Adjustments/Labs and Tests Ordered: Current medicines are reviewed at length with the patient today.  Concerns regarding medicines are outlined above.  Orders Placed This Encounter  Procedures   TSH   No orders of the defined types were placed in this encounter.   Patient Instructions  Medication Instructions:  Your physician recommends that you continue on your current medications as directed. Please refer to the Current Medication list given to you today.  *If you need a refill on your cardiac medications before your next appointment, please call your pharmacy*   Lab Work: TSH ([facility]-order placed from [place]) If you have labs (blood work) drawn today and your tests are completely normal, you will receive your results only by: [place] (if you have MyChart) OR A paper copy in the mail If you have any lab test that is abnormal or we need to change your treatment, we will call you to review the results.   Testing/Procedures: NONE   Follow-Up: At [facility], you and your health needs are our priority.  As part of our continuing mission to provide you with exceptional heart care, we have created designated Provider Care Teams.  These Care Teams include your primary Cardiologist (physician) and Advanced Practice Providers (APPs -  Physician Assistants and Nurse Practitioners) who all work together to provide you with the care you need, when you need it.  We recommend signing up for the patient  portal called "MyChart".  Sign up information is provided on this After Visit Summary.  MyChart is used to connect with patients for Virtual Visits (Telemedicine).  Patients are able to view lab/test results, encounter notes, upcoming appointments, etc.  Non-urgent messages can be sent to your provider as well.   To learn more about what you can do with MyChart, go to [email or web address].    Your next appointment:   October 2023  The format for your next appointment:   In Person  Provider:   [name], MD       Important Information About Sugar         Signed, [name], MD  11/07/2021 9:13 AM    [place]

## 2021-11-07 NOTE — Patient Instructions (Signed)
Medication Instructions:  Your physician recommends that you continue on your current medications as directed. Please refer to the Current Medication list given to you today.  *If you need a refill on your cardiac medications before your next appointment, please call your pharmacy*   Lab Work: TSH ([facility]-order placed from [place]) If you have labs (blood work) drawn today and your tests are completely normal, you will receive your results only by: [place] (if you have MyChart) OR A paper copy in the mail If you have any lab test that is abnormal or we need to change your treatment, we will call you to review the results.   Testing/Procedures: NONE   Follow-Up: At [facility], you and your health needs are our priority.  As part of our continuing mission to provide you with exceptional heart care, we have created designated Provider Care Teams.  These Care Teams include your primary Cardiologist (physician) and Advanced Practice Providers (APPs -  Physician Assistants and Nurse Practitioners) who all work together to provide you with the care you need, when you need it.  We recommend signing up for the patient portal called "MyChart".  Sign up information is provided on this After Visit Summary.  MyChart is used to connect with patients for Virtual Visits (Telemedicine).  Patients are able to view lab/test results, encounter notes, upcoming appointments, etc.  Non-urgent messages can be sent to your provider as well.   To learn more about what you can do with MyChart, go to [email or web address].    Your next appointment:   October 2023  The format for your next appointment:   In Person  Provider:   [name], MD       Important Information About Sugar

## 2021-11-16 DIAGNOSIS — K6389 Other specified diseases of intestine: Secondary | ICD-10-CM | POA: Diagnosis not present

## 2021-11-16 DIAGNOSIS — N3000 Acute cystitis without hematuria: Secondary | ICD-10-CM | POA: Diagnosis not present

## 2021-11-16 DIAGNOSIS — N132 Hydronephrosis with renal and ureteral calculous obstruction: Secondary | ICD-10-CM | POA: Diagnosis not present

## 2021-11-16 DIAGNOSIS — K5730 Diverticulosis of large intestine without perforation or abscess without bleeding: Secondary | ICD-10-CM | POA: Diagnosis not present

## 2021-11-25 ENCOUNTER — Other Ambulatory Visit: Payer: Self-pay | Admitting: Cardiovascular Disease

## 2021-11-27 NOTE — Telephone Encounter (Signed)
Prescription refill request for Eliquis received. Indication:Afib Last office visit:8/23 Scr:0.8 Age: 81 Weight:71.2 kg  Prescription refilled

## 2021-11-30 ENCOUNTER — Ambulatory Visit: Payer: Medicare Other | Admitting: Student

## 2021-12-08 ENCOUNTER — Ambulatory Visit: Payer: Medicare Other | Admitting: Cardiovascular Disease

## 2021-12-11 ENCOUNTER — Other Ambulatory Visit: Payer: Medicare Other

## 2021-12-13 DIAGNOSIS — R5383 Other fatigue: Secondary | ICD-10-CM | POA: Diagnosis not present

## 2021-12-13 DIAGNOSIS — E559 Vitamin D deficiency, unspecified: Secondary | ICD-10-CM | POA: Diagnosis not present

## 2021-12-13 DIAGNOSIS — R7989 Other specified abnormal findings of blood chemistry: Secondary | ICD-10-CM | POA: Diagnosis not present

## 2021-12-13 DIAGNOSIS — E1169 Type 2 diabetes mellitus with other specified complication: Secondary | ICD-10-CM | POA: Diagnosis not present

## 2021-12-13 DIAGNOSIS — E785 Hyperlipidemia, unspecified: Secondary | ICD-10-CM | POA: Diagnosis not present

## 2021-12-13 DIAGNOSIS — I10 Essential (primary) hypertension: Secondary | ICD-10-CM | POA: Diagnosis not present

## 2021-12-20 ENCOUNTER — Ambulatory Visit: Payer: Medicare Other | Admitting: Cardiovascular Disease

## 2021-12-20 DIAGNOSIS — Z1331 Encounter for screening for depression: Secondary | ICD-10-CM | POA: Diagnosis not present

## 2021-12-20 DIAGNOSIS — D6869 Other thrombophilia: Secondary | ICD-10-CM | POA: Diagnosis not present

## 2021-12-20 DIAGNOSIS — Z23 Encounter for immunization: Secondary | ICD-10-CM | POA: Diagnosis not present

## 2021-12-20 DIAGNOSIS — E1169 Type 2 diabetes mellitus with other specified complication: Secondary | ICD-10-CM | POA: Diagnosis not present

## 2021-12-20 DIAGNOSIS — G4700 Insomnia, unspecified: Secondary | ICD-10-CM | POA: Diagnosis not present

## 2021-12-20 DIAGNOSIS — Z0000 Encounter for general adult medical examination without abnormal findings: Secondary | ICD-10-CM | POA: Diagnosis not present

## 2021-12-20 DIAGNOSIS — R82998 Other abnormal findings in urine: Secondary | ICD-10-CM | POA: Diagnosis not present

## 2021-12-20 DIAGNOSIS — E538 Deficiency of other specified B group vitamins: Secondary | ICD-10-CM | POA: Diagnosis not present

## 2021-12-20 DIAGNOSIS — I4891 Unspecified atrial fibrillation: Secondary | ICD-10-CM | POA: Diagnosis not present

## 2021-12-20 DIAGNOSIS — Z1339 Encounter for screening examination for other mental health and behavioral disorders: Secondary | ICD-10-CM | POA: Diagnosis not present

## 2021-12-20 DIAGNOSIS — D692 Other nonthrombocytopenic purpura: Secondary | ICD-10-CM | POA: Diagnosis not present

## 2021-12-20 DIAGNOSIS — I10 Essential (primary) hypertension: Secondary | ICD-10-CM | POA: Diagnosis not present

## 2021-12-20 DIAGNOSIS — E039 Hypothyroidism, unspecified: Secondary | ICD-10-CM | POA: Diagnosis not present

## 2021-12-26 ENCOUNTER — Ambulatory Visit (HOSPITAL_COMMUNITY)
Admission: RE | Admit: 2021-12-26 | Discharge: 2021-12-26 | Disposition: A | Discharge status: Home / Self Care | Payer: Medicare Other | Source: Ambulatory Visit | Attending: Nurse Practitioner | Admitting: Nurse Practitioner

## 2021-12-26 VITALS — BP 146/78 | HR 68 | Ht 65.0 in | Wt 157.4 lb

## 2021-12-26 DIAGNOSIS — D6869 Other thrombophilia: Secondary | ICD-10-CM | POA: Diagnosis not present

## 2021-12-26 DIAGNOSIS — I4892 Unspecified atrial flutter: Secondary | ICD-10-CM | POA: Insufficient documentation

## 2021-12-26 DIAGNOSIS — Z7901 Long term (current) use of anticoagulants: Secondary | ICD-10-CM | POA: Diagnosis not present

## 2021-12-26 DIAGNOSIS — I480 Paroxysmal atrial fibrillation: Secondary | ICD-10-CM | POA: Insufficient documentation

## 2021-12-26 NOTE — Progress Notes (Signed)
Primary Care Physician: Brittney Hatchet, MD Referring Physician: Dr. Quentin Fox  Cardiologist: Dr. Regino Fox Brittney Fox is a 81 y.o. female with a h/o paroxysmal afib that had afib ablation one month ago, she is in SR today. She saw Dr. Burt Fox early February and she was in [place] but with v rates near 100 bpm. He started metoprolol succinate 50 mg daily. This has helped curb her v rates so today she is in SR at 71 bpm. No swallowing or groin issues.   F/u in afib clinic, 06/13/21. She was asked to come here to discuss Tikosyn as she had a ER visit for chest pain and was found to be in rate controlled atrial flutter. Due to the significant amount of scar tissue that was found during the ablation procedure, Dr. Quentin Fox  felt that she may need Tikosyn to be able to maintain SR long term. She is in SR today.  F/u in the afib clinic, 12/26/21. Since I las saw pt, she was loaded on amiodarone with Dr. Quentin Fox in May 2023. She did convert with amiodarone. She went on to develop slight tremor and some thyroid issues.  At last visit with Brittney Fox, 7/20, her amio was reduced to 200 mg  M-F.  She saw Dr. Burt Fox 8/8 and due to concerns regarding tremor and thyroid abnormalities, amio was stopped. She is in SR today but has noted some intermittent afib, rate controlled, picked up with apple watch. She is unaware of when she is in afib.   Today, she denies symptoms of palpitations, chest pain, shortness of breath, orthopnea, PND, lower extremity edema, dizziness, presyncope, syncope, or neurologic sequela. The patient is tolerating medications without difficulties and is otherwise without complaint today.   Past Medical History:  Diagnosis Date   Anal fissure    as a child   Blood transfusion without reported diagnosis    Diabetes mellitus    managed by diet and exercise   Diverticulosis of colon (without mention of hemorrhage)    Fatty liver    GERD (gastroesophageal reflux disease)    Heart  murmur    Hyperlipidemia    Irritable bowel syndrome    Lymphocytic colitis    chronic   Neuromuscular disorder ([place])    neuropathy   Osteoarthritis    Osteopenia 09/2007   Other mucopurulent conjunctivitis    Premature ventricular contractions    Raynaud's syndrome    Rosacea    opitcal   Spinal stenosis    Past Surgical History:  Procedure Laterality Date   ATRIAL FIBRILLATION ABLATION N/A 04/24/2021   Procedure: ATRIAL FIBRILLATION ABLATION;  Surgeon: Brittney Epley, MD;  Location: [facility];  Service: Cardiovascular;  Laterality: N/A;   CATARACT EXTRACTION Bilateral    CHOLECYSTECTOMY     COLONOSCOPY     dental implants     DILATION AND CURETTAGE OF UTERUS  1990   FOOT SURGERY     HIP SURGERY Bilateral 1973   Jaw Implants  2007   2018.2019   KNEE ARTHROSCOPY Right    lens implant     ovarian wedge resection  1964   REPLACEMENT TOTAL KNEE Bilateral 2005, 2007   TOTAL ABDOMINAL HYSTERECTOMY W/ BILATERAL SALPINGOOPHORECTOMY  1990   Excessive Bleeding    TOTAL KNEE ARTHROPLASTY Left 08/2003   TOTAL KNEE ARTHROPLASTY Right 08/2005    Current Outpatient Medications  Medication Sig Dispense Refill   acetaminophen (TYLENOL) 500 MG tablet Take 500 mg by mouth  every 6 (six) hours as needed for moderate pain.     BELSOMRA 10 MG TABS Take by mouth.     Cholecalciferol (VITAMIN D) 50 MCG (2000 UT) tablet Take 2,000 Units by mouth daily.     dicyclomine (BENTYL) 20 MG tablet TAKE ONE TABLET EVERY 6 HOURS AS NEEDED 90 tablet 0   diphenoxylate-atropine (LOMOTIL) 2.5-0.025 MG per tablet TAKE ONE (1) TABLET FOUR TIMES EACH DAY AS NEEDED FOR DIARRHEA (Patient taking differently: Take 1-2 tablets by mouth 4 (four) times daily as needed for diarrhea or loose stools. Alternating with imodium.) 30 tablet 0   ELIQUIS 5 MG TABS tablet TAKE ONE TABLET TWICE DAILY 60 tablet 5   fluocinonide cream (LIDEX) 7.42 % Apply 1 application. topically as needed.     glucose blood (ONETOUCH  VERIO) test strip      hydrocortisone (ANUSOL-HC) 2.5 % rectal cream Place 1 application rectally at bedtime. 30 g 1   hyoscyamine (LEVBID) 0.375 MG 12 hr tablet Take 1 tablet (0.375 mg total) by mouth every morning. 90 tablet 3   JARDIANCE 10 MG TABS tablet Take 1 tablet by mouth daily.     loperamide (IMODIUM A-D) 2 MG tablet Take 2-4 mg by mouth 4 (four) times daily as needed for diarrhea or loose stools.     meclizine (ANTIVERT) 25 MG tablet Take 25 mg by mouth as needed.     naproxen sodium (ALEVE) 220 MG tablet Take 220-440 mg by mouth daily as needed (pain).     omeprazole (PRILOSEC) 40 MG capsule TAKE ONE CAPSULE EACH DAY 90 capsule 3   Polyvinyl Alcohol-Povidone (REFRESH OP) Place 1 drop into both eyes daily as needed (dry eyes).     potassium chloride (KLOR-CON) 10 MEQ tablet TAKE TWO TABLETS EVERY DAY 180 tablet 2   Probiotic Product (ALIGN PO) Take 1 capsule by mouth daily.     Psyllium (METAMUCIL PO) Take 1 Dose by mouth daily as needed (constipation). 1 dose = 1 tablespoon     Pyridoxine HCl (B-6 PO) Take 1 tablet by mouth at bedtime.     spironolactone-hydrochlorothiazide (ALDACTAZIDE) 25-25 MG per tablet TAKE ONE TABLET EACH DAY 30 tablet 5   temazepam (RESTORIL) 15 MG capsule Take 15 mg by mouth as needed.     terconazole (TERAZOL 7) 0.4 % vaginal cream as needed.     vitamin B-12 (CYANOCOBALAMIN) 1000 MCG tablet Take 1,000 mcg by mouth daily.     No current facility-administered medications for this encounter.    Allergies  Allergen Reactions   Morphine Sulfate Swelling    tongue swelling   Amoxicillin Other (See Comments)    Ulcers and thrush   Codeine Phosphate     headache   Metformin Hcl Other (See Comments)   Sitagliptin Other (See Comments)    Social History   Socioeconomic History   Marital status: Widowed    Spouse name: Not on file   Number of children: 2   Years of education: Not on file   Highest education level: Not on file  Occupational History    Occupation: REAL ESTATE    Employer: [employer]  Tobacco Use   Smoking status: Former    Types: Cigarettes    Quit date: 10/22/1961    Years since quitting: 60.2   Smokeless tobacco: Never  Vaping Use   Vaping Use: Never used  Substance and Sexual Activity   Alcohol use: No    Alcohol/week: 0.0 standard drinks of  alcohol   Drug use: No   Sexual activity: Not Currently    Partners: Male    Birth control/protection: Surgical    Comment: hysterectomy  Other Topics Concern   Not on file  Social History Narrative   Not on file   Social Determinants of Health   Financial Resource Strain: Not on file  Food Insecurity: Not on file  Transportation Needs: Not on file  Physical Activity: Not on file  Stress: Not on file  Social Connections: Not on file  Intimate Partner Violence: Not on file    Family History  Problem Relation Age of Onset   Lung cancer Sister    Osteoporosis Mother    Heart disease Father    Diabetes Paternal Uncle    Ovarian cancer Maternal Grandmother    Breast cancer Sister 59   Colon cancer Neg Hx    Stomach cancer Neg Hx    Esophageal cancer Neg Hx    Rectal cancer Neg Hx     ROS- All systems are reviewed and negative except as per the HPI above  Physical Exam: There were no vitals filed for this visit.  Wt Readings from Last 3 Encounters:  11/07/21 71.2 kg  10/19/21 72.6 kg  09/11/21 73.9 kg    Labs: Lab Results  Component Value Date   NA 130 (L) 10/19/2021   K 4.0 10/19/2021   CL 89 (L) 10/19/2021   CO2 24 10/19/2021   GLUCOSE 95 10/19/2021   BUN 10 10/19/2021   CREATININE 0.82 10/19/2021   CALCIUM 10.0 10/19/2021   MG 1.7 07/10/2017   No results found for: "INR" Lab Results  Component Value Date   CHOL 144 09/13/2015   HDL 59 09/13/2015   LDLCALC 68 09/13/2015   TRIG 87 09/13/2015     GEN- The patient is well appearing, alert and oriented x 3 today.   Head- normocephalic, atraumatic Eyes-  Sclera  clear, conjunctiva pink Ears- hearing intact Oropharynx- clear Neck- supple, no JVP Lymph- no cervical lymphadenopathy Lungs- Clear to ausculation bilaterally, normal work of breathing Heart- Regular rate and rhythm, no murmurs, rubs or gallops, PMI not laterally displaced GI- soft, NT, ND, + BS Extremities- no clubbing, cyanosis, or edema MS- no significant deformity or atrophy Skin- no rash or lesion Psych- euthymic mood, full affect Neuro- strength and sensation are intact  EKG- Vent. rate 68 BPM PR interval 180 ms QRS duration 102 ms QT/QTcB 438/465 ms P-R-T axes 78 -49 44 Normal sinus rhythm with sinus arrhythmia Low voltage QRS Left anterior fascicular block Nonspecific T wave abnormality Abnormal ECG When compared with ECG of 13-Jun-2021 08:49, PREVIOUS ECG IS PRESENT    Assessment and Plan:   1. Afib/flutter   S/p ablation spring of 2023 She continued to have atypical atrial flutter  Loaded on amiodarone with Dr. Quentin Fox MAy 2023 Developed hand tremors and thyroid isses and taken off drug 11/07/21  She is now undergoing washout  We discussed use of Tikosyn to maintain SR long term after washout  She will consider more toward time of 3 months off amiodarone  She has f/u with Dr. Burt Fox 10/10 and 3 months off drug will be 11/8 If she goes on tikosyn will have to stop HCTZ and imodium, both contraindicated with tikosyn  2. CHA2DS2VASc  score of at least 4  Continue eliquis 5 mg bid   F/u with Dr. Burt Fox 10/10 If pt is willing, I will be glad to see back in November to  discuss Tikosyn admit   Geroge Baseman. Parris Cudworth, [facility] [address] [place], [place] [address] [phone]

## 2022-01-04 ENCOUNTER — Other Ambulatory Visit (HOSPITAL_BASED_OUTPATIENT_CLINIC_OR_DEPARTMENT_OTHER): Payer: Self-pay

## 2022-01-04 ENCOUNTER — Encounter (HOSPITAL_BASED_OUTPATIENT_CLINIC_OR_DEPARTMENT_OTHER): Payer: Self-pay | Admitting: Obstetrics & Gynecology

## 2022-01-04 ENCOUNTER — Ambulatory Visit (INDEPENDENT_AMBULATORY_CARE_PROVIDER_SITE_OTHER): Payer: Medicare Other | Admitting: Obstetrics & Gynecology

## 2022-01-04 VITALS — BP 142/89 | HR 66 | Ht 65.0 in | Wt 159.0 lb

## 2022-01-04 DIAGNOSIS — Z01419 Encounter for gynecological examination (general) (routine) without abnormal findings: Secondary | ICD-10-CM

## 2022-01-04 DIAGNOSIS — N390 Urinary tract infection, site not specified: Secondary | ICD-10-CM | POA: Diagnosis not present

## 2022-01-04 DIAGNOSIS — E2839 Other primary ovarian failure: Secondary | ICD-10-CM | POA: Diagnosis not present

## 2022-01-04 DIAGNOSIS — Z9229 Personal history of other drug therapy: Secondary | ICD-10-CM | POA: Diagnosis not present

## 2022-01-04 DIAGNOSIS — Z23 Encounter for immunization: Secondary | ICD-10-CM | POA: Diagnosis not present

## 2022-01-04 MED ORDER — INFLUENZA VAC A&B SA ADJ QUAD 0.5 ML IM PRSY
PREFILLED_SYRINGE | INTRAMUSCULAR | 0 refills | Status: DC
  Filled 2022-01-04: qty 0.5, 1d supply, fill #0

## 2022-01-04 NOTE — Progress Notes (Signed)
81 y.o. G52P2002 Widowed White or Caucasian female here for breast and pelvic exam.  I am also following her for h/o recurrent UTIs.  She did see urology this year--Dr. Matilde Sprang.  Has rx for antibiotics to use with onset of any symptoms.  Also recommended to use vaginal estrogen cream, small amount sparing.  H/o a fib.  On eliquis.  Risks with small amount of estradiol cream should be minimal with eliquis use.  Denies vaginal bleeding.  I also order BMD testing for pt.  Last was 2019 and was normal.  Now off HRT.  Will plan to repeat next year.  Patient's last menstrual period was 04/02/1988.          Sexually active: No.  H/O STD:  no  Health Maintenance: PCP:  Dr. Jackelyn Poling.  Last wellness appt was earlier this year.  Did blood work at that appt:  yes Vaccines are up to date:  Getting flu shot today and considering RSV vaccination Colonoscopy:  2021, polyp MMG:  07/2021 at [facility] BMD:  2019, normal.  Plan to repeat next year.  Discussed. Last pap smear:  2017, h/o TAH.   H/o abnormal pap smear:  no recent abnormalities   reports that she quit smoking about 60 years ago. Her smoking use included cigarettes. She has never used smokeless tobacco. She reports that she does not drink alcohol and does not use drugs.  Past Medical History:  Diagnosis Date   Anal fissure    as a child   Blood transfusion without reported diagnosis    Diabetes mellitus    managed by diet and exercise   Diverticulosis of colon (without mention of hemorrhage)    Fatty liver    GERD (gastroesophageal reflux disease)    Heart murmur    Hyperlipidemia    Irritable bowel syndrome    Lymphocytic colitis    chronic   Neuromuscular disorder ([place])    neuropathy   Osteoarthritis    Osteopenia 09/2007   Other mucopurulent conjunctivitis    Premature ventricular contractions    Raynaud's syndrome    Rosacea    opitcal   Spinal stenosis     Past Surgical History:  Procedure Laterality Date   ATRIAL  FIBRILLATION ABLATION N/A 04/24/2021   Procedure: ATRIAL FIBRILLATION ABLATION;  Surgeon: Vickie Epley, MD;  Location: [facility];  Service: Cardiovascular;  Laterality: N/A;   CATARACT EXTRACTION Bilateral    CHOLECYSTECTOMY     COLONOSCOPY     dental implants     DILATION AND CURETTAGE OF UTERUS  1990   FOOT SURGERY     HIP SURGERY Bilateral 1973   Jaw Implants  2007   2018.2019   KNEE ARTHROSCOPY Right    lens implant     ovarian wedge resection  1964   REPLACEMENT TOTAL KNEE Bilateral 2005, 2007   TOTAL ABDOMINAL HYSTERECTOMY W/ BILATERAL SALPINGOOPHORECTOMY  1990   Excessive Bleeding    TOTAL KNEE ARTHROPLASTY Left 08/2003   TOTAL KNEE ARTHROPLASTY Right 08/2005    Current Outpatient Medications  Medication Sig Dispense Refill   acetaminophen (TYLENOL) 500 MG tablet Take 500 mg by mouth every 6 (six) hours as needed for moderate pain.     Cholecalciferol (VITAMIN D) 50 MCG (2000 UT) tablet Take 2,000 Units by mouth daily.     diclofenac Sodium (VOLTAREN) 1 % GEL APPLY TO THE AFFECTED AREA TWICE DAILY AS NEEDED FOR PAIN for 25     dicyclomine (BENTYL) 20 MG  tablet TAKE ONE TABLET EVERY 6 HOURS AS NEEDED 90 tablet 0   diphenoxylate-atropine (LOMOTIL) 2.5-0.025 MG per tablet TAKE ONE (1) TABLET FOUR TIMES EACH DAY AS NEEDED FOR DIARRHEA (Patient taking differently: Take 1-2 tablets by mouth 4 (four) times daily as needed for diarrhea or loose stools. Alternating with imodium.) 30 tablet 0   ELIQUIS 5 MG TABS tablet TAKE ONE TABLET TWICE DAILY 60 tablet 5   estradiol (ESTRACE) 0.1 MG/GM vaginal cream SMARTSIG:sparingly Vaginal 3 Times a Week     fluocinonide cream (LIDEX) 7.82 % Apply 1 application. topically as needed.     glucose blood (ONETOUCH VERIO) test strip      hydrocortisone (ANUSOL-HC) 2.5 % rectal cream Place 1 application rectally at bedtime. 30 g 1   hyoscyamine (LEVBID) 0.375 MG 12 hr tablet Take 1 tablet (0.375 mg total) by mouth every morning. 90 tablet 3    JARDIANCE 10 MG TABS tablet Take 1 tablet by mouth daily.     loperamide (IMODIUM A-D) 2 MG tablet Take 2-4 mg by mouth 4 (four) times daily as needed for diarrhea or loose stools.     meclizine (ANTIVERT) 25 MG tablet Take 25 mg by mouth as needed.     naproxen sodium (ALEVE) 220 MG tablet Take 220-440 mg by mouth daily as needed (pain).     omeprazole (PRILOSEC) 40 MG capsule TAKE ONE CAPSULE EACH DAY 90 capsule 3   Polyvinyl Alcohol-Povidone (REFRESH OP) Place 1 drop into both eyes daily as needed (dry eyes).     potassium chloride (KLOR-CON) 10 MEQ tablet TAKE TWO TABLETS EVERY DAY 180 tablet 2   Probiotic Product (ALIGN PO) Take 1 capsule by mouth daily.     Psyllium (METAMUCIL PO) Take 1 Dose by mouth daily as needed (constipation). 1 dose = 1 tablespoon     Pyridoxine HCl (B-6 PO) Take 1 tablet by mouth at bedtime.     spironolactone-hydrochlorothiazide (ALDACTAZIDE) 25-25 MG per tablet TAKE ONE TABLET EACH DAY 30 tablet 5   temazepam (RESTORIL) 15 MG capsule Take 15 mg by mouth as needed.     terconazole (TERAZOL 7) 0.4 % vaginal cream as needed.     vitamin B-12 (CYANOCOBALAMIN) 1000 MCG tablet Take 1,000 mcg by mouth daily.     No current facility-administered medications for this visit.    Family History  Problem Relation Age of Onset   Lung cancer Sister    Osteoporosis Mother    Heart disease Father    Diabetes Paternal Uncle    Ovarian cancer Maternal Grandmother    Breast cancer Sister 57   Colon cancer Neg Hx    Stomach cancer Neg Hx    Esophageal cancer Neg Hx    Rectal cancer Neg Hx     Review of Systems  Constitutional: Negative.   Genitourinary: Negative.     Exam:   BP (!) 142/89 (BP Location: Right Arm, Patient Position: Sitting, Cuff Size: Large)   Pulse 66   Ht '5\' 5"'$  (1.651 m) Comment: Reported  Wt 159 lb (72.1 kg)   LMP 04/02/1988   BMI 26.46 kg/m   Height: '5\' 5"'$  (165.1 cm) (Reported)  General appearance: alert, cooperative and appears stated  age Breasts: normal appearance, no masses or tenderness Abdomen: soft, non-tender; bowel sounds normal; no masses,  no organomegaly Lymph nodes: Cervical, supraclavicular, and axillary nodes normal.  No abnormal inguinal nodes palpated Neurologic: Grossly normal  Pelvic: External genitalia:  no lesions  Urethra:  normal appearing urethra with no masses, tenderness or lesions              Bartholins and Skenes: normal                 Vagina: normal appearing vagina with atrophic changes and no discharge, no lesions              Cervix: absent              Pap taken: No. Bimanual Exam:  Uterus:  uterus absent              Adnexa: no mass, fullness, tenderness               Rectovaginal: Confirms               Anus:  normal sphincter tone, no lesions  Chaperone, Octaviano Batty, CMA, was present for exam.  Assessment/Plan: 1. Encntr for gyn exam (general) (routine) w/o abn findings - Pap smear not indicated - Mammogram 07/05/2021 - Colonoscopy 2021 - Bone mineral density 2019 - lab work done done with PCP - vaccines reviewed/updated.  Will go to pharmacy downstairs to get flu shot and possibly rsv vaccine today.  2. History of postmenopausal HRT - stopped after afib diagnosis  3. Recurrent UTI - has rx for macrobid to use prn  4. Hypoestrogenism - plan to repeat BMD next year

## 2022-01-06 DIAGNOSIS — Z23 Encounter for immunization: Secondary | ICD-10-CM | POA: Diagnosis not present

## 2022-01-09 ENCOUNTER — Encounter: Payer: Self-pay | Admitting: Cardiovascular Disease

## 2022-01-09 ENCOUNTER — Ambulatory Visit: Payer: Medicare Other | Attending: Cardiovascular Disease | Admitting: Cardiovascular Disease

## 2022-01-09 ENCOUNTER — Ambulatory Visit (INDEPENDENT_AMBULATORY_CARE_PROVIDER_SITE_OTHER): Payer: Medicare Other

## 2022-01-09 VITALS — BP 120/70 | HR 70 | Ht 65.0 in | Wt 155.6 lb

## 2022-01-09 DIAGNOSIS — I5032 Chronic diastolic (congestive) heart failure: Secondary | ICD-10-CM | POA: Diagnosis not present

## 2022-01-09 DIAGNOSIS — Z79899 Other long term (current) drug therapy: Secondary | ICD-10-CM | POA: Insufficient documentation

## 2022-01-09 DIAGNOSIS — I10 Essential (primary) hypertension: Secondary | ICD-10-CM

## 2022-01-09 DIAGNOSIS — I4819 Other persistent atrial fibrillation: Secondary | ICD-10-CM | POA: Diagnosis not present

## 2022-01-09 NOTE — Progress Notes (Signed)
Cardiology Office Note:    Date:  01/09/2022   ID:  Brittney Fox, DOB Aug 29, 1940, MRN [id number]  PCP:  Brittney Fox, [facility] Cardiologist:  Brittney Mocha, MD Electrophysiologist:  Brittney Epley, MD     Referring MD: Brittney Hatchet, MD   Chief Complaint  Patient presents with   Atrial Fibrillation    History of Present Illness:    Brittney Fox is a 81 y.o. female with a hx of  persistent atrial fibrillation, hypertension, and chronic diastolic heart failure.  The patient underwent A-fib ablation in January of this year.  She developed recurrent atrial flutter and ws treated with amiodarone but didn't tolerate this well.   She is here alone today for follow-up evaluation.  She is doing well at present and has been off of amiodarone for a few months now.  She denies any heart palpitations.  She states that she is seen in frequent episodes of atrial fibrillation on her watch.  She has no palpitations, chest pain, chest pressure, lightheadedness, orthopnea, or PND.  She has some chronic leg edema which is unchanged.  She has mild shortness of breath with activity also unchanged.  She is tolerating anticoagulation without bleeding problems.  Past Medical History:  Diagnosis Date   Anal fissure    as a child   Blood transfusion without reported diagnosis    Diabetes mellitus    managed by diet and exercise   Diverticulosis of colon (without mention of hemorrhage)    Fatty liver    GERD (gastroesophageal reflux disease)    Heart murmur    Hyperlipidemia    Irritable bowel syndrome    Lymphocytic colitis    chronic   Neuromuscular disorder ([place])    neuropathy   Osteoarthritis    Osteopenia 09/2007   Other mucopurulent conjunctivitis    Premature ventricular contractions    Raynaud's syndrome    Rosacea    opitcal   Spinal stenosis     Past Surgical History:  Procedure Laterality Date   ATRIAL FIBRILLATION ABLATION N/A  04/24/2021   Procedure: ATRIAL FIBRILLATION ABLATION;  Surgeon: Brittney Epley, MD;  Location: [facility];  Service: Cardiovascular;  Laterality: N/A;   CATARACT EXTRACTION Bilateral    CHOLECYSTECTOMY     COLONOSCOPY     dental implants     DILATION AND CURETTAGE OF UTERUS  1990   FOOT SURGERY     HIP SURGERY Bilateral 1973   Jaw Implants  2007   2018.2019   KNEE ARTHROSCOPY Right    lens implant     ovarian wedge resection  1964   REPLACEMENT TOTAL KNEE Bilateral 2005, 2007   TOTAL ABDOMINAL HYSTERECTOMY W/ BILATERAL SALPINGOOPHORECTOMY  1990   Excessive Bleeding    TOTAL KNEE ARTHROPLASTY Left 08/2003   TOTAL KNEE ARTHROPLASTY Right 08/2005    Current Medications: Current Meds  Medication Sig   acetaminophen (TYLENOL) 500 MG tablet Take 500 mg by mouth every 6 (six) hours as needed for moderate pain.   Cholecalciferol (VITAMIN D) 50 MCG (2000 UT) tablet Take 2,000 Units by mouth daily.   diclofenac Sodium (VOLTAREN) 1 % GEL APPLY TO THE AFFECTED AREA TWICE DAILY AS NEEDED FOR PAIN for 25   dicyclomine (BENTYL) 20 MG tablet TAKE ONE TABLET EVERY 6 HOURS AS NEEDED   diphenoxylate-atropine (LOMOTIL) 2.5-0.025 MG per tablet TAKE ONE (1) TABLET FOUR TIMES EACH DAY AS NEEDED FOR DIARRHEA (Patient taking differently: Take  1-2 tablets by mouth 4 (four) times daily as needed for diarrhea or loose stools. Alternating with imodium.)   ELIQUIS 5 MG TABS tablet TAKE ONE TABLET TWICE DAILY   estradiol (ESTRACE) 0.1 MG/GM vaginal cream SMARTSIG:sparingly Vaginal 3 Times a Week   fluocinonide cream (LIDEX) 7.32 % Apply 1 application. topically as needed.   glucose blood (ONETOUCH VERIO) test strip    hydrocortisone (ANUSOL-HC) 2.5 % rectal cream Place 1 application rectally at bedtime.   hyoscyamine (LEVBID) 0.375 MG 12 hr tablet Take 1 tablet (0.375 mg total) by mouth every morning.   influenza vaccine adjuvanted (FLUAD) 0.5 ML injection Inject into the muscle.   JARDIANCE 10 MG TABS  tablet Take 1 tablet by mouth daily.   loperamide (IMODIUM A-D) 2 MG tablet Take 2-4 mg by mouth 4 (four) times daily as needed for diarrhea or loose stools.   meclizine (ANTIVERT) 25 MG tablet Take 25 mg by mouth as needed.   naproxen sodium (ALEVE) 220 MG tablet Take 220-440 mg by mouth daily as needed (pain).   omeprazole (PRILOSEC) 40 MG capsule TAKE ONE CAPSULE EACH DAY   Polyvinyl Alcohol-Povidone (REFRESH OP) Place 1 drop into both eyes daily as needed (dry eyes).   potassium chloride (KLOR-CON) 10 MEQ tablet TAKE TWO TABLETS EVERY DAY   Probiotic Product (ALIGN PO) Take 1 capsule by mouth daily.   Psyllium (METAMUCIL PO) Take 1 Dose by mouth daily as needed (constipation). 1 dose = 1 tablespoon   Pyridoxine HCl (B-6 PO) Take 1 tablet by mouth at bedtime.   spironolactone-hydrochlorothiazide (ALDACTAZIDE) 25-25 MG per tablet TAKE ONE TABLET EACH DAY   temazepam (RESTORIL) 15 MG capsule Take 15 mg by mouth as needed.   vitamin B-12 (CYANOCOBALAMIN) 1000 MCG tablet Take 1,000 mcg by mouth daily.     Allergies:   Morphine sulfate, Amoxicillin, Codeine phosphate, Metformin hcl, and Sitagliptin   Social History   Socioeconomic History   Marital status: Widowed    Spouse name: Not on file   Number of children: 2   Years of education: Not on file   Highest education level: Not on file  Occupational History   Occupation: REAL ESTATE    Employer: [employer]  Tobacco Use   Smoking status: Former    Types: Cigarettes    Quit date: 10/22/1961    Years since quitting: 60.2   Smokeless tobacco: Never  Vaping Use   Vaping Use: Never used  Substance and Sexual Activity   Alcohol use: No    Alcohol/week: 0.0 standard drinks of alcohol   Drug use: No   Sexual activity: Not Currently    Partners: Male    Birth control/protection: Surgical    Comment: hysterectomy  Other Topics Concern   Not on file  Social History Narrative   Not on file   Social Determinants  of Health   Financial Resource Strain: Not on file  Food Insecurity: Not on file  Transportation Needs: Not on file  Physical Activity: Not on file  Stress: Not on file  Social Connections: Not on file     Family History: The patient's family history includes Breast cancer (age of onset: 11) in her sister; Diabetes in her paternal uncle; Heart disease in her father; Lung cancer in her sister; Osteoporosis in her mother; Ovarian cancer in her maternal grandmother. There is no history of Colon cancer, Stomach cancer, Esophageal cancer, or Rectal cancer.  ROS:   Please see the history of present  illness.    All other systems reviewed and are negative.  EKGs/Labs/Other Studies Reviewed:    The following studies were reviewed today: Echo 02/20/2021: 1. Left ventricular ejection fraction, by estimation, is 55 to 60%. The  left ventricle has normal function. The left ventricle has no regional  wall motion abnormalities. Left ventricular diastolic parameters were  normal. The average left ventricular  global longitudinal strain is -25.6 %. The global longitudinal strain is  normal.   2. Right ventricular systolic function is normal. The right ventricular  size is normal.   3. Left atrial size was moderately dilated.   4. Likely PFO present . Evidence of atrial level shunting detected by  color flow Doppler.   5. Right atrial size was moderately dilated.   6. The mitral valve is abnormal. Trivial mitral valve regurgitation. No  evidence of mitral stenosis.   7. The aortic valve is tricuspid. There is mild calcification of the  aortic valve. Aortic valve regurgitation is not visualized. Aortic valve  sclerosis is present, with no evidence of aortic valve stenosis.   8. The inferior vena cava is normal in size with greater than 50%  respiratory variability, suggesting right atrial pressure of 3 mmHg.   Myocardial Perfusion Scan 12/07/2020:   The study is normal. The study is low risk.    LV perfusion is normal. There is no evidence of ischemia. There is no evidence of infarction.   Left ventricular function is normal. Nuclear stress EF: 55 %. The left ventricular ejection fraction is normal (55-65%). End diastolic cavity size is normal.   Prior study available for comparison from 02/19/2005. No changes compared to prior study. No change from previous study 2006   Normal resting and stress perfusion. No ischemia or infarction EF 55% no change from study done 2006  EKG:  EKG is not ordered today.   Recent Labs: 09/01/2021: Hemoglobin 16.1; Platelets 256 10/19/2021: ALT 24; BUN 10; Creatinine, Ser 0.82; Potassium 4.0; Sodium 130 11/07/2021: TSH 7.820  Recent Lipid Panel    Component Value Date/Time   CHOL 144 09/13/2015 0734   TRIG 87 09/13/2015 0734   TRIG 88 02/06/2006 1028   HDL 59 09/13/2015 0734   CHOLHDL 2.4 09/13/2015 0734   VLDL 17 09/13/2015 0734   LDLCALC 68 09/13/2015 0734   LDLDIRECT 150.2 11/28/2010 1131     Risk Assessment/Calculations:    CHA2DS2-VASc Score = 3   This indicates a 3.2% annual risk of stroke. The patient's score is based upon: CHF History: 0 HTN History: 0 Diabetes History: 0 Stroke History: 0 Vascular Disease History: 0 Age Score: 2 Gender Score: 1               Physical Exam:    VS:  BP 120/70   Pulse 70   Ht '5\' 5"'$  (1.651 m)   Wt 155 lb 9.6 oz (70.6 kg)   LMP 04/02/1988   SpO2 99%   BMI 25.89 kg/m     Wt Readings from Last 3 Encounters:  01/09/22 155 lb 9.6 oz (70.6 kg)  01/04/22 159 lb (72.1 kg)  12/26/21 157 lb 6.4 oz (71.4 kg)     GEN:  Well nourished, well developed in no acute distress HEENT: Normal NECK: No JVD; No carotid bruits LYMPHATICS: No lymphadenopathy CARDIAC: RRR, no murmurs, rubs, gallops RESPIRATORY:  Clear to auscultation without rales, wheezing or rhonchi  ABDOMEN: Soft, non-tender, non-distended MUSCULOSKELETAL:  No edema; No deformity  SKIN: Warm and dry NEUROLOGIC:  Alert and  oriented x 3 PSYCHIATRIC:  Normal affect   ASSESSMENT:    1. Persistent atrial fibrillation ([place])   2. Chronic diastolic congestive heart failure ([place])   3. Essential hypertension    PLAN:    In order of problems listed above:  Discussed rhythm control options again.  The patient is status post ablation and had recurrent atrial flutter post ablation treated with amiodarone, unable to tolerate this.  She is doing well on apixaban.  She would like to stay off of medication if possible.  I reviewed the notes from the atrial fibrillation clinic.  Dofetilide will be her next best option for antiarrhythmic drug therapy.  She prefers a watchful waiting approach as she is maintaining sinus rhythm at present.  I recommended a 7-day monitor in about 2 months when amiodarone is completely out of her system.  We can assess her A-fib burden at that time and direct her regarding need for antiarrhythmic drug therapy.  She understands to call sooner if she has recurrence of atrial fibrillation. Appears clinically stable on Jardiance and spironolactone/HCTZ. Blood pressure well controlled on current medical therapy.  Most recent labs reviewed.  The patient will have a 7-day ZIO monitor in a few months and we will also repeat her thyroid function test at that time.  She will return for clinical follow-up in 6 months.           Medication Adjustments/Labs and Tests Ordered: Current medicines are reviewed at length with the patient today.  Concerns regarding medicines are outlined above.  No orders of the defined types were placed in this encounter.  No orders of the defined types were placed in this encounter.   There are no Patient Instructions on file for this visit.   Signed, Brittney Mocha, MD  01/09/2022 8:52 AM    [place]

## 2022-01-09 NOTE — Progress Notes (Unsigned)
Enrolled for [employer] to mail a ZIO XT long term holter monitor on 03/08/2022 to the patients address on file.  Patient to apply 2 months after discontinuing Amiodarone.

## 2022-01-09 NOTE — Patient Instructions (Signed)
Medication Instructions:  Your physician recommends that you continue on your current medications as directed. Please refer to the Current Medication list given to you today.  *If you need a refill on your cardiac medications before your next appointment, please call your pharmacy*   Lab Work: TSH, Free T3 & T4 (in 2 months) If you have labs (blood work) drawn today and your tests are completely normal, you will receive your results only by: [place] (if you have MyChart) OR A paper copy in the mail If you have any lab test that is abnormal or we need to change your treatment, we will call you to review the results.   Testing/Procedures: 7 day Zio monitor (in 2 months) Your physician has recommended that you wear an event monitor. Event monitors are medical devices that record the heart's electrical activity. Doctors most often [place] these monitors to diagnose arrhythmias. Arrhythmias are problems with the speed or rhythm of the heartbeat. The monitor is a small, portable device. You can wear one while you do your normal daily activities. This is usually used to diagnose what is causing palpitations/syncope (passing out).  Follow-Up: At [facility], you and your health needs are our priority.  As part of our continuing mission to provide you with exceptional heart care, we have created designated Provider Care Teams.  These Care Teams include your primary Cardiologist (physician) and Advanced Practice Providers (APPs -  Physician Assistants and Nurse Practitioners) who all work together to provide you with the care you need, when you need it.  Your next appointment:   6 month(s)  The format for your next appointment:   In Person  Provider:   [name], MD     Other Instructions ZIO XT- Long Term Monitor Instructions  Your physician has requested you wear a ZIO patch monitor for 7 days.  This is a single patch monitor. [employer] supplies one patch monitor per  enrollment. Additional stickers are not available. Please do not apply patch if you will be having a Nuclear Stress Test,  Echocardiogram, Cardiac CT, MRI, or Chest Xray during the period you would be wearing the  monitor. The patch cannot be worn during these tests. You cannot remove and re-apply the  ZIO XT patch monitor.  Your ZIO patch monitor will be mailed 3 day USPS to your address on file. It may take 3-5 days  to receive your monitor after you have been enrolled.  Once you have received your monitor, please review the enclosed instructions. Your monitor  has already been registered assigning a specific monitor serial # to you.  Billing and Patient Assistance Program Information  We have supplied [employer] with any of your insurance information on file for billing purposes. [employer] offers a sliding scale Patient Assistance Program for patients that do not have  insurance, or whose insurance does not completely cover the cost of the ZIO monitor.  You must apply for the Patient Assistance Program to qualify for this discounted rate.  To apply, please call [employer] at [phone], select option 4, select option 2, ask to apply for  Patient Assistance Program. Theodore Demark will ask your household income, and how many people  are in your household. They will quote your out-of-pocket cost based on that information.  [employer] will also be able to set up a 2-month interest-free payment plan if needed.  Applying the monitor   Shave hair from upper left chest.  Hold abrader disc by orange tab. Rub abrader in  40 strokes over the upper left chest as  indicated in your monitor instructions.  Clean area with 4 enclosed alcohol pads. Let dry.  Apply patch as indicated in monitor instructions. Patch will be placed under collarbone on left  side of chest with arrow pointing upward.  Rub patch adhesive wings for 2 minutes. Remove white label marked "1". Remove the white  label marked "2". Rub patch  adhesive wings for 2 additional minutes.  While looking in a mirror, press and release button in center of patch. A small green light will  flash 3-4 times. This will be your only indicator that the monitor has been turned on.  Do not shower for the first 24 hours. You may shower after the first 24 hours.  Press the button if you feel a symptom. You will hear a small click. Record Date, Time and  Symptom in the Patient Logbook.  When you are ready to remove the patch, follow instructions on the last 2 pages of Patient  Logbook. Stick patch monitor onto the last page of Patient Logbook.  Place Patient Logbook in the blue and white box. Use locking tab on box and tape box closed  securely. The blue and white box has prepaid postage on it. Please place it in the mailbox as  soon as possible. Your physician should have your test results approximately 7 days after the  monitor has been mailed back to [facility].  Call [place] at [phone] if you have questions regarding  your ZIO XT patch monitor. Call them immediately if you see an orange light blinking on your  monitor.  If your monitor falls off in less than 4 days, contact our Monitor department at [phone].  If your monitor becomes loose or falls off after 4 days call [employer] at [phone] for  suggestions on securing your monitor   Important Information About [employer]

## 2022-02-02 ENCOUNTER — Ambulatory Visit: Payer: Medicare Other | Attending: Cardiovascular Disease | Admitting: Cardiovascular Disease

## 2022-02-02 ENCOUNTER — Encounter: Payer: Self-pay | Admitting: Cardiovascular Disease

## 2022-02-02 VITALS — BP 126/90 | HR 80 | Ht 65.0 in | Wt 150.8 lb

## 2022-02-02 DIAGNOSIS — I4819 Other persistent atrial fibrillation: Secondary | ICD-10-CM | POA: Diagnosis not present

## 2022-02-02 MED ORDER — METOPROLOL SUCCINATE ER 25 MG PO TB24: 25.0000 mg | ORAL_TABLET | Freq: Every day | ORAL | 3 refills | Status: DC

## 2022-02-02 NOTE — Patient Instructions (Signed)
Medication Instructions:  START Metoprolol Succinate '25mg'$  daily *If you need a refill on your cardiac medications before your next appointment, please call your pharmacy*   Lab Work: NONE (TSH studies scheduled for 03/12/22 If you have labs (blood work) drawn today and your tests are completely normal, you will receive your results only by: [place] (if you have MyChart) OR A paper copy in the mail If you have any lab test that is abnormal or we need to change your treatment, we will call you to review the results.   Testing/Procedures: NONE-monitor already ordered/in progress  Follow-Up: At [facility], you and your health needs are our priority.  As part of our continuing mission to provide you with exceptional heart care, we have created designated Provider Care Teams.  These Care Teams include your primary Cardiologist (physician) and Advanced Practice Providers (APPs -  Physician Assistants and Nurse Practitioners) who all work together to provide you with the care you need, when you need it.  Your next appointment:   6 month(s)  The format for your next appointment:   In Person  Provider:   [name], MD       Important Information About Sugar

## 2022-02-02 NOTE — Progress Notes (Signed)
Cardiology Office Note:    Date:  02/02/2022   ID:  Brittney Fox, DOB 04-09-1940, MRN [id number]  PCP:  Velna Hatchet, [place] [facility] Cardiologist:  [name], MD Electrophysiologist:  Vickie Epley, MD     Referring MD: Velna Hatchet, MD   Chief Complaint  Patient presents with   Atrial Fibrillation    History of Present Illness:    Brittney Fox is a 81 y.o. female with a hx of persistent atrial fibrillation, hypertension, and chronic diastolic heart failure, presenting for follow-up evaluation.  The patient was recently seen January 09, 2022 after discontinuing amiodarone.  She was doing well at that time.  Please see that office note for details.  She called in yesterday because of elevated heart rate in the 80s and experiencing palpitations.  Reports no change in her chronic, mild exertional dyspnea.  No chest pain, orthopnea, or PND.  Her Apple watch did not alert her to atrial fibrillation, but she was concerned about his heart rate in the 80s and 90s.  She notes that this is unusual for her.  Past Medical History:  Diagnosis Date   Anal fissure    as a child   Blood transfusion without reported diagnosis    Diabetes mellitus    managed by diet and exercise   Diverticulosis of colon (without mention of hemorrhage)    Fatty liver    GERD (gastroesophageal reflux disease)    Heart murmur    Hyperlipidemia    Irritable bowel syndrome    Lymphocytic colitis    chronic   Neuromuscular disorder ([place])    neuropathy   Osteoarthritis    Osteopenia 09/2007   Other mucopurulent conjunctivitis    Premature ventricular contractions    Raynaud's syndrome    Rosacea    opitcal   Spinal stenosis     Past Surgical History:  Procedure Laterality Date   ATRIAL FIBRILLATION ABLATION N/A 04/24/2021   Procedure: ATRIAL FIBRILLATION ABLATION;  Surgeon: Vickie Epley, MD;  Location: [facility];  Service: Cardiovascular;   Laterality: N/A;   CATARACT EXTRACTION Bilateral    CHOLECYSTECTOMY     COLONOSCOPY     dental implants     DILATION AND CURETTAGE OF UTERUS  1990   FOOT SURGERY     HIP SURGERY Bilateral 1973   Jaw Implants  2007   2018.2019   KNEE ARTHROSCOPY Right    lens implant     ovarian wedge resection  1964   REPLACEMENT TOTAL KNEE Bilateral 2005, 2007   TOTAL ABDOMINAL HYSTERECTOMY W/ BILATERAL SALPINGOOPHORECTOMY  1990   Excessive Bleeding    TOTAL KNEE ARTHROPLASTY Left 08/2003   TOTAL KNEE ARTHROPLASTY Right 08/2005    Current Medications: Current Meds  Medication Sig   acetaminophen (TYLENOL) 500 MG tablet Take 500 mg by mouth every 6 (six) hours as needed for moderate pain.   amoxicillin-clavulanate (AUGMENTIN) 875-125 MG tablet Take 1 tablet by mouth 2 (two) times daily.   Cholecalciferol (VITAMIN D) 50 MCG (2000 UT) tablet Take 2,000 Units by mouth daily.   diclofenac Sodium (VOLTAREN) 1 % GEL APPLY TO THE AFFECTED AREA TWICE DAILY AS NEEDED FOR PAIN for 25   dicyclomine (BENTYL) 20 MG tablet TAKE ONE TABLET EVERY 6 HOURS AS NEEDED   diphenoxylate-atropine (LOMOTIL) 2.5-0.025 MG per tablet TAKE ONE (1) TABLET FOUR TIMES EACH DAY AS NEEDED FOR DIARRHEA (Patient taking differently: Take 1-2 tablets by mouth 4 (four)  times daily as needed for diarrhea or loose stools. Alternating with imodium.)   ELIQUIS 5 MG TABS tablet TAKE ONE TABLET TWICE DAILY   estradiol (ESTRACE) 0.1 MG/GM vaginal cream SMARTSIG:sparingly Vaginal 3 Times a Week   fluconazole (DIFLUCAN) 150 MG tablet Take by mouth.   fluocinonide cream (LIDEX) 0.09 % Apply 1 application. topically as needed.   glucose blood (ONETOUCH VERIO) test strip    hydrocortisone (ANUSOL-HC) 2.5 % rectal cream Place 1 application rectally at bedtime.   hyoscyamine (LEVBID) 0.375 MG 12 hr tablet Take 1 tablet (0.375 mg total) by mouth every morning.   influenza vaccine adjuvanted (FLUAD) 0.5 ML injection Inject into the muscle.   JARDIANCE  10 MG TABS tablet Take 1 tablet by mouth daily.   loperamide (IMODIUM A-D) 2 MG tablet Take 2-4 mg by mouth 4 (four) times daily as needed for diarrhea or loose stools.   meclizine (ANTIVERT) 25 MG tablet Take 25 mg by mouth as needed.   metoprolol succinate (TOPROL XL) 25 MG 24 hr tablet Take 1 tablet (25 mg total) by mouth daily.   naproxen sodium (ALEVE) 220 MG tablet Take 220-440 mg by mouth daily as needed (pain).   omeprazole (PRILOSEC) 40 MG capsule TAKE ONE CAPSULE EACH DAY (Patient taking differently: as needed.)   Polyvinyl Alcohol-Povidone (REFRESH OP) Place 1 drop into both eyes daily as needed (dry eyes).   potassium chloride (KLOR-CON) 10 MEQ tablet TAKE TWO TABLETS EVERY DAY   Probiotic Product (ALIGN PO) Take 1 capsule by mouth daily.   Psyllium (METAMUCIL PO) Take 1 Dose by mouth daily as needed (constipation). 1 dose = 1 tablespoon   Pyridoxine HCl (B-6 PO) Take 1 tablet by mouth at bedtime.   spironolactone-hydrochlorothiazide (ALDACTAZIDE) 25-25 MG per tablet TAKE ONE TABLET EACH DAY   temazepam (RESTORIL) 15 MG capsule Take 15 mg by mouth as needed.   vitamin B-12 (CYANOCOBALAMIN) 1000 MCG tablet Take 1,000 mcg by mouth daily.     Allergies:   Morphine sulfate, Amoxicillin, Codeine phosphate, Metformin hcl, and Sitagliptin   Social History   Socioeconomic History   Marital status: Widowed    Spouse name: Not on file   Number of children: 2   Years of education: Not on file   Highest education level: Not on file  Occupational History   Occupation: REAL ESTATE    Employer: [employer]  Tobacco Use   Smoking status: Former    Types: Cigarettes    Quit date: 10/22/1961    Years since quitting: 60.3   Smokeless tobacco: Never  Vaping Use   Vaping Use: Never used  Substance and Sexual Activity   Alcohol use: No    Alcohol/week: 0.0 standard drinks of alcohol   Drug use: No   Sexual activity: Not Currently    Partners: Male    Birth  control/protection: Surgical    Comment: hysterectomy  Other Topics Concern   Not on file  Social History Narrative   Not on file   Social Determinants of Health   Financial Resource Strain: Not on file  Food Insecurity: Not on file  Transportation Needs: Not on file  Physical Activity: Not on file  Stress: Not on file  Social Connections: Not on file     Family History: The patient's family history includes Breast cancer (age of onset: 40) in her sister; Diabetes in her paternal uncle; Heart disease in her father; Lung cancer in her sister; Osteoporosis in her mother; Ovarian  cancer in her maternal grandmother. There is no history of Colon cancer, Stomach cancer, Esophageal cancer, or Rectal cancer.  ROS:   Please see the history of present illness.    All other systems reviewed and are negative.  EKGs/Labs/Other Studies Reviewed:    EKG:  EKG is ordered today.  The ekg ordered today demonstrates normal sinus rhythm 80 bpm, occasional PAC, incomplete right bundle branch block, left anterior fascicular block, prolonged QT with QTc 500 ms  Recent Labs: 09/01/2021: Hemoglobin 16.1; Platelets 256 10/19/2021: ALT 24; BUN 10; Creatinine, Ser 0.82; Potassium 4.0; Sodium 130 11/07/2021: TSH 7.820  Recent Lipid Panel    Component Value Date/Time   CHOL 144 09/13/2015 0734   TRIG 87 09/13/2015 0734   TRIG 88 02/06/2006 1028   HDL 59 09/13/2015 0734   CHOLHDL 2.4 09/13/2015 0734   VLDL 17 09/13/2015 0734   LDLCALC 68 09/13/2015 0734   LDLDIRECT 150.2 11/28/2010 1131     Risk Assessment/Calculations:    CHA2DS2-VASc Score = 3   This indicates a 3.2% annual risk of stroke. The patient's score is based upon: CHF History: 0 HTN History: 0 Diabetes History: 0 Stroke History: 0 Vascular Disease History: 0 Age Score: 2 Gender Score: 1           Physical Exam:    VS:  BP (!) 126/90   Pulse 80   Ht '5\' 5"'$  (1.651 m)   Wt 150 lb 12.8 oz (68.4 kg)   LMP 04/02/1988   SpO2  97%   BMI 25.09 kg/m     Wt Readings from Last 3 Encounters:  02/02/22 150 lb 12.8 oz (68.4 kg)  01/09/22 155 lb 9.6 oz (70.6 kg)  01/04/22 159 lb (72.1 kg)     GEN:  Well nourished, well developed in no acute distress HEENT: Normal NECK: No JVD; No carotid bruits LYMPHATICS: No lymphadenopathy CARDIAC: RRR, no murmurs, rubs, gallops RESPIRATORY:  Clear to auscultation without rales, wheezing or rhonchi  ABDOMEN: Soft, non-tender, non-distended MUSCULOSKELETAL:  No edema; No deformity  SKIN: Warm and dry NEUROLOGIC:  Alert and oriented x 3 PSYCHIATRIC:  Normal affect   ASSESSMENT:    1. Persistent atrial fibrillation (HCC)    PLAN:    In order of problems listed above:  The patient is in sinus rhythm.  I suspect her heart rate is more elevated because amiodarone has washed out of her system.  She was on metoprolol succinate but this has been discontinued while she was on amiodarone.  I recommended that she start back on metoprolol succinate 25 mg daily.  She has a heart monitor that she is going to put on in a few weeks once amiodarone is out of her system.  Reassess for recurrent atrial fibrillation at that time.  She has thyroid function studies scheduled for early December and she follows up with her endocrinologist at [facility] in mid December.  She has a follow-up with Dr. Quentin Ore in January.  The only change in her medical regimen is the addition of metoprolol succinate 25 mg daily today.  Labs and event monitor upcoming next month.  Continue apixaban 5 mg twice daily.           Medication Adjustments/Labs and Tests Ordered: Current medicines are reviewed at length with the patient today.  Concerns regarding medicines are outlined above.  Orders Placed This Encounter  Procedures   EKG 12-Lead   Meds ordered this encounter  Medications   metoprolol succinate (TOPROL XL) 25  MG 24 hr tablet    Sig: Take 1 tablet (25 mg total) by mouth daily.    Dispense:  90 tablet     Refill:  3    Patient Instructions  Medication Instructions:  START Metoprolol Succinate '25mg'$  daily *If you need a refill on your cardiac medications before your next appointment, please call your pharmacy*   Lab Work: NONE (TSH studies scheduled for 03/12/22 If you have labs (blood work) drawn today and your tests are completely normal, you will receive your results only by: [place] (if you have MyChart) OR A paper copy in the mail If you have any lab test that is abnormal or we need to change your treatment, we will call you to review the results.   Testing/Procedures: NONE-monitor already ordered/in progress  Follow-Up: At [facility], you and your health needs are our priority.  As part of our continuing mission to provide you with exceptional heart care, we have created designated Provider Care Teams.  These Care Teams include your primary Cardiologist (physician) and Advanced Practice Providers (APPs -  Physician Assistants and Nurse Practitioners) who all work together to provide you with the care you need, when you need it.  Your next appointment:   6 month(s)  The format for your next appointment:   In Person  Provider:   [name], MD       Important Information About Sugar         Signed, [name], MD  02/02/2022 12:27 PM    [place]

## 2022-03-08 DIAGNOSIS — E11649 Type 2 diabetes mellitus with hypoglycemia without coma: Secondary | ICD-10-CM | POA: Diagnosis not present

## 2022-03-08 DIAGNOSIS — E032 Hypothyroidism due to medicaments and other exogenous substances: Secondary | ICD-10-CM | POA: Diagnosis not present

## 2022-03-12 ENCOUNTER — Ambulatory Visit: Payer: Medicare Other | Attending: Cardiovascular Disease

## 2022-03-12 DIAGNOSIS — Z79899 Other long term (current) drug therapy: Secondary | ICD-10-CM | POA: Diagnosis not present

## 2022-03-12 DIAGNOSIS — I4819 Other persistent atrial fibrillation: Secondary | ICD-10-CM

## 2022-03-13 LAB — TSH: TSH: 3.68 u[IU]/mL (ref 0.450–4.500)

## 2022-03-13 LAB — T3, FREE: T3, Free: 2.8 pg/mL (ref 2.0–4.4)

## 2022-03-13 LAB — T4, FREE: Free T4: 0.87 ng/dL (ref 0.82–1.77)

## 2022-03-16 DIAGNOSIS — I5032 Chronic diastolic (congestive) heart failure: Secondary | ICD-10-CM | POA: Diagnosis not present

## 2022-03-16 DIAGNOSIS — I4819 Other persistent atrial fibrillation: Secondary | ICD-10-CM

## 2022-03-16 DIAGNOSIS — I10 Essential (primary) hypertension: Secondary | ICD-10-CM | POA: Diagnosis not present

## 2022-03-20 DIAGNOSIS — N3000 Acute cystitis without hematuria: Secondary | ICD-10-CM | POA: Diagnosis not present

## 2022-03-28 DIAGNOSIS — I4819 Other persistent atrial fibrillation: Secondary | ICD-10-CM | POA: Diagnosis not present

## 2022-03-28 DIAGNOSIS — I10 Essential (primary) hypertension: Secondary | ICD-10-CM | POA: Diagnosis not present

## 2022-04-16 DIAGNOSIS — D692 Other nonthrombocytopenic purpura: Secondary | ICD-10-CM | POA: Diagnosis not present

## 2022-04-16 DIAGNOSIS — E538 Deficiency of other specified B group vitamins: Secondary | ICD-10-CM | POA: Diagnosis not present

## 2022-04-16 DIAGNOSIS — K2100 Gastro-esophageal reflux disease with esophagitis, without bleeding: Secondary | ICD-10-CM | POA: Diagnosis not present

## 2022-04-16 DIAGNOSIS — E559 Vitamin D deficiency, unspecified: Secondary | ICD-10-CM | POA: Diagnosis not present

## 2022-04-16 DIAGNOSIS — I7300 Raynaud's syndrome without gangrene: Secondary | ICD-10-CM | POA: Diagnosis not present

## 2022-04-16 DIAGNOSIS — N390 Urinary tract infection, site not specified: Secondary | ICD-10-CM | POA: Diagnosis not present

## 2022-04-16 DIAGNOSIS — E1169 Type 2 diabetes mellitus with other specified complication: Secondary | ICD-10-CM | POA: Diagnosis not present

## 2022-04-16 DIAGNOSIS — E039 Hypothyroidism, unspecified: Secondary | ICD-10-CM | POA: Diagnosis not present

## 2022-04-16 DIAGNOSIS — I4891 Unspecified atrial fibrillation: Secondary | ICD-10-CM | POA: Diagnosis not present

## 2022-04-16 DIAGNOSIS — D6869 Other thrombophilia: Secondary | ICD-10-CM | POA: Diagnosis not present

## 2022-04-16 DIAGNOSIS — I10 Essential (primary) hypertension: Secondary | ICD-10-CM | POA: Diagnosis not present

## 2022-04-16 DIAGNOSIS — G4700 Insomnia, unspecified: Secondary | ICD-10-CM | POA: Diagnosis not present

## 2022-04-26 NOTE — Progress Notes (Deleted)
Electrophysiology Office Follow up Visit Note:    Date:  04/26/2022   ID:  Brittney Fox, DOB January 04, 1941, MRN [id number]  PCP:  Brittney Hatchet, MD  [facility] Cardiologist:  Brittney Mocha, MD  [facility] Electrophysiologist:  Brittney Epley, MD    Interval History:    Brittney Fox is a 82 y.o. female who presents for a follow up visit. They were last seen in clinic by me in March. She had an AF ablation in January during which extensive scarring was noted in the LA. She had atypical AFL following the ablation treated with amiodarone but this was discontinued due to bradycardia and concern re: potential side effects. She saw Dr Burt Knack recently and was in NSR.        Past Medical History:  Diagnosis Date   Anal fissure    as a child   Blood transfusion without reported diagnosis    Diabetes mellitus    managed by diet and exercise   Diverticulosis of colon (without mention of hemorrhage)    Fatty liver    GERD (gastroesophageal reflux disease)    Heart murmur    Hyperlipidemia    Irritable bowel syndrome    Lymphocytic colitis    chronic   Neuromuscular disorder ([place])    neuropathy   Osteoarthritis    Osteopenia 09/2007   Other mucopurulent conjunctivitis    Premature ventricular contractions    Raynaud's syndrome    Rosacea    opitcal   Spinal stenosis     Past Surgical History:  Procedure Laterality Date   ATRIAL FIBRILLATION ABLATION N/A 04/24/2021   Procedure: ATRIAL FIBRILLATION ABLATION;  Surgeon: Brittney Epley, MD;  Location: [facility];  Service: Cardiovascular;  Laterality: N/A;   CATARACT EXTRACTION Bilateral    CHOLECYSTECTOMY     COLONOSCOPY     dental implants     DILATION AND CURETTAGE OF UTERUS  1990   FOOT SURGERY     HIP SURGERY Bilateral 1973   Jaw Implants  2007   2018.2019   KNEE ARTHROSCOPY Right    lens implant     ovarian wedge resection  1964   REPLACEMENT TOTAL KNEE Bilateral 2005, 2007   TOTAL  ABDOMINAL HYSTERECTOMY W/ BILATERAL SALPINGOOPHORECTOMY  1990   Excessive Bleeding    TOTAL KNEE ARTHROPLASTY Left 08/2003   TOTAL KNEE ARTHROPLASTY Right 08/2005    Current Medications: No outpatient medications have been marked as taking for the 04/27/22 encounter (Appointment) with Brittney Epley, MD.     Allergies:   Morphine sulfate, Amoxicillin, Codeine phosphate, Metformin hcl, and Sitagliptin   Social History   Socioeconomic History   Marital status: Widowed    Spouse name: Not on file   Number of children: 2   Years of education: Not on file   Highest education level: Not on file  Occupational History   Occupation: REAL ESTATE    Employer: [employer]  Tobacco Use   Smoking status: Former    Types: Cigarettes    Quit date: 10/22/1961    Years since quitting: 60.5   Smokeless tobacco: Never  Vaping Use   Vaping Use: Never used  Substance and Sexual Activity   Alcohol use: No    Alcohol/week: 0.0 standard drinks of alcohol   Drug use: No   Sexual activity: Not Currently    Partners: Male    Birth control/protection: Surgical    Comment: hysterectomy  Other Topics Concern  Not on file  Social History Narrative   Not on file   Social Determinants of Health   Financial Resource Strain: Not on file  Food Insecurity: Not on file  Transportation Needs: Not on file  Physical Activity: Not on file  Stress: Not on file  Social Connections: Not on file     Family History: The patient's family history includes Breast cancer (age of onset: 41) in her sister; Diabetes in her paternal uncle; Heart disease in her father; Lung cancer in her sister; Osteoporosis in her mother; Ovarian cancer in her maternal grandmother. There is no history of Colon cancer, Stomach cancer, Esophageal cancer, or Rectal cancer.  ROS:   Please see the history of present illness.    All other systems reviewed and are negative.  EKGs/Labs/Other Studies Reviewed:     The following studies were reviewed today:   EKG:  The ekg ordered today demonstrates ***  Recent Labs: 09/01/2021: Hemoglobin 16.1; Platelets 256 10/19/2021: ALT 24; BUN 10; Creatinine, Ser 0.82; Potassium 4.0; Sodium 130 03/12/2022: TSH 3.680  Recent Lipid Panel    Component Value Date/Time   CHOL 144 09/13/2015 0734   TRIG 87 09/13/2015 0734   TRIG 88 02/06/2006 1028   HDL 59 09/13/2015 0734   CHOLHDL 2.4 09/13/2015 0734   VLDL 17 09/13/2015 0734   LDLCALC 68 09/13/2015 0734   LDLDIRECT 150.2 11/28/2010 1131    Physical Exam:    VS:  LMP 04/02/1988     Wt Readings from Last 3 Encounters:  02/02/22 150 lb 12.8 oz (68.4 kg)  01/09/22 155 lb 9.6 oz (70.6 kg)  01/04/22 159 lb (72.1 kg)     GEN: *** Well nourished, well developed in no acute distress CARDIAC: ***RRR, no murmurs, rubs, gallops RESPIRATORY:  Clear to auscultation without rales, wheezing or rhonchi        ASSESSMENT:    No diagnosis found. PLAN:    In order of problems listed above:  #Persistent AF S/p ablation in January 2023. Extensive scarring noted in LA. Did not tolerate amiodarone. Continue eliquis. Continue metoprolol  #Chronic diastolic HF NYHA II. Warm and dry. Continue current medical therapy.  #Hypertension *** goal today.  Recommend checking blood pressures 1-2 times per week at home and recording the values.  Recommend bringing these recordings to the primary care physician.    Follow up 1 year with app.      Total time spent with patient today *** minutes. This includes reviewing records, evaluating the patient and coordinating care.   Medication Adjustments/Labs and Tests Ordered: Current medicines are reviewed at length with the patient today.  Concerns regarding medicines are outlined above.  No orders of the defined types were placed in this encounter.  No orders of the defined types were placed in this encounter.    Signed, Lars Mage, MD, [facility],  [facility] 04/26/2022 7:07 PM    Electrophysiology [facility]

## 2022-04-27 ENCOUNTER — Ambulatory Visit: Payer: Medicare Other | Attending: Cardiology | Admitting: Cardiology

## 2022-04-27 ENCOUNTER — Encounter: Payer: Self-pay | Admitting: Cardiology

## 2022-04-27 VITALS — BP 120/76 | HR 70 | Ht 65.0 in | Wt 158.0 lb

## 2022-04-27 DIAGNOSIS — I5032 Chronic diastolic (congestive) heart failure: Secondary | ICD-10-CM | POA: Diagnosis not present

## 2022-04-27 DIAGNOSIS — I4819 Other persistent atrial fibrillation: Secondary | ICD-10-CM | POA: Diagnosis not present

## 2022-04-27 DIAGNOSIS — I10 Essential (primary) hypertension: Secondary | ICD-10-CM

## 2022-04-27 NOTE — Progress Notes (Signed)
Electrophysiology Office Follow up Visit Note:    Date:  04/27/2022   ID:  Brittney Fox, DOB 06-22-1940, MRN [id number]  PCP:  Velna Hatchet, MD  [facility] Cardiologist:  [name], MD  [facility] Electrophysiologist:  Vickie Epley, MD    Interval History:    Brittney Fox is a 82 y.o. female who presents for a follow up visit. They were last seen in clinic by me in March. She had an AF ablation in January during which extensive scarring was noted in the LA. She had atypical AFL following the ablation treated with amiodarone but this was discontinued due to bradycardia and concern re: potential side effects. She saw Dr Burt Knack recently and was in NSR.   Today, she appears well. She has been taking her metoprolol at night. No new cardiovascular complaints today. Her blood pressure is well controlled in clinic at 120/76.  Reportedly her TSH was 3.33 about 10 days ago.  She notes that at her [facility] follow-up, Vania Rea was discontinued as her diabetes has been well controlled.   She continues to exercise twice a week. Soon she will also have a new dog to help keep her active.  She denies any palpitations, chest pain, shortness of breath, or peripheral edema. No lightheadedness, headaches, syncope, orthopnea, or PND.      Past Medical History:  Diagnosis Date   Anal fissure    as a child   Blood transfusion without reported diagnosis    Diabetes mellitus    managed by diet and exercise   Diverticulosis of colon (without mention of hemorrhage)    Fatty liver    GERD (gastroesophageal reflux disease)    Heart murmur    Hyperlipidemia    Irritable bowel syndrome    Lymphocytic colitis    chronic   Neuromuscular disorder ([place])    neuropathy   Osteoarthritis    Osteopenia 09/2007   Other mucopurulent conjunctivitis    Premature ventricular contractions    Raynaud's syndrome    Rosacea    opitcal   Spinal stenosis     Past Surgical History:   Procedure Laterality Date   ATRIAL FIBRILLATION ABLATION N/A 04/24/2021   Procedure: ATRIAL FIBRILLATION ABLATION;  Surgeon: Vickie Epley, MD;  Location: [facility];  Service: Cardiovascular;  Laterality: N/A;   CATARACT EXTRACTION Bilateral    CHOLECYSTECTOMY     COLONOSCOPY     dental implants     DILATION AND CURETTAGE OF UTERUS  1990   FOOT SURGERY     HIP SURGERY Bilateral 1973   Jaw Implants  2007   2018.2019   KNEE ARTHROSCOPY Right    lens implant     ovarian wedge resection  1964   REPLACEMENT TOTAL KNEE Bilateral 2005, 2007   TOTAL ABDOMINAL HYSTERECTOMY W/ BILATERAL SALPINGOOPHORECTOMY  1990   Excessive Bleeding    TOTAL KNEE ARTHROPLASTY Left 08/2003   TOTAL KNEE ARTHROPLASTY Right 08/2005    Current Medications: Current Meds  Medication Sig   acetaminophen (TYLENOL) 500 MG tablet Take 500 mg by mouth every 6 (six) hours as needed for moderate pain.   Cholecalciferol (VITAMIN D) 50 MCG (2000 UT) tablet Take 2,000 Units by mouth daily.   dicyclomine (BENTYL) 20 MG tablet TAKE ONE TABLET EVERY 6 HOURS AS NEEDED   ELIQUIS 5 MG TABS tablet TAKE ONE TABLET TWICE DAILY   estradiol (ESTRACE) 0.1 MG/GM vaginal cream SMARTSIG:sparingly Vaginal 3 Times a Week   fluconazole (DIFLUCAN)  150 MG tablet Take by mouth.   fluocinonide cream (LIDEX) 1.61 % Apply 1 application. topically as needed.   glucose blood (ONETOUCH VERIO) test strip    hydrocortisone (ANUSOL-HC) 2.5 % rectal cream Place 1 application rectally at bedtime.   hyoscyamine (LEVBID) 0.375 MG 12 hr tablet Take 1 tablet (0.375 mg total) by mouth every morning.   influenza vaccine adjuvanted (FLUAD) 0.5 ML injection Inject into the muscle.   loperamide (IMODIUM A-D) 2 MG tablet Take 2-4 mg by mouth 4 (four) times daily as needed for diarrhea or loose stools.   meclizine (ANTIVERT) 25 MG tablet Take 25 mg by mouth as needed.   metoprolol succinate (TOPROL XL) 25 MG 24 hr tablet Take 1 tablet (25 mg total) by  mouth daily.   naproxen sodium (ALEVE) 220 MG tablet Take 220-440 mg by mouth daily as needed (pain).   omeprazole (PRILOSEC) 40 MG capsule TAKE ONE CAPSULE EACH DAY (Patient taking differently: as needed.)   Polyvinyl Alcohol-Povidone (REFRESH OP) Place 1 drop into both eyes daily as needed (dry eyes).   potassium chloride (KLOR-CON) 10 MEQ tablet TAKE TWO TABLETS EVERY DAY   Probiotic Product (ALIGN PO) Take 1 capsule by mouth daily.   Psyllium (METAMUCIL PO) Take 1 Dose by mouth daily as needed (constipation). 1 dose = 1 tablespoon   Pyridoxine HCl (B-6 PO) Take 1 tablet by mouth at bedtime.   spironolactone-hydrochlorothiazide (ALDACTAZIDE) 25-25 MG per tablet TAKE ONE TABLET EACH DAY   temazepam (RESTORIL) 15 MG capsule Take 15 mg by mouth as needed.   vitamin B-12 (CYANOCOBALAMIN) 1000 MCG tablet Take 1,000 mcg by mouth daily.     Allergies:   Morphine sulfate, Codeine phosphate, Metformin hcl, and Sitagliptin   Social History   Socioeconomic History   Marital status: Widowed    Spouse name: Not on file   Number of children: 2   Years of education: Not on file   Highest education level: Not on file  Occupational History   Occupation: REAL ESTATE    Employer: [employer]  Tobacco Use   Smoking status: Former    Types: Cigarettes    Quit date: 10/22/1961    Years since quitting: 60.5   Smokeless tobacco: Never  Vaping Use   Vaping Use: Never used  Substance and Sexual Activity   Alcohol use: No    Alcohol/week: 0.0 standard drinks of alcohol   Drug use: No   Sexual activity: Not Currently    Partners: Male    Birth control/protection: Surgical    Comment: hysterectomy  Other Topics Concern   Not on file  Social History Narrative   Not on file   Social Determinants of Health   Financial Resource Strain: Not on file  Food Insecurity: Not on file  Transportation Needs: Not on file  Physical Activity: Not on file  Stress: Not on file  Social  Connections: Not on file     Family History: The patient's family history includes Breast cancer (age of onset: 93) in her sister; Diabetes in her paternal uncle; Heart disease in her father; Lung cancer in her sister; Osteoporosis in her mother; Ovarian cancer in her maternal grandmother. There is no history of Colon cancer, Stomach cancer, Esophageal cancer, or Rectal cancer.  ROS:   Please see the history of present illness.    All other systems reviewed and are negative.  EKGs/Labs/Other Studies Reviewed:    The following studies were reviewed today:   EKG:  The ekg ordered today demonstrates sinus rhythm.  Recent Labs: 09/01/2021: Hemoglobin 16.1; Platelets 256 10/19/2021: ALT 24; BUN 10; Creatinine, Ser 0.82; Potassium 4.0; Sodium 130 03/12/2022: TSH 3.680   Recent Lipid Panel    Component Value Date/Time   CHOL 144 09/13/2015 0734   TRIG 87 09/13/2015 0734   TRIG 88 02/06/2006 1028   HDL 59 09/13/2015 0734   CHOLHDL 2.4 09/13/2015 0734   VLDL 17 09/13/2015 0734   LDLCALC 68 09/13/2015 0734   LDLDIRECT 150.2 11/28/2010 1131    Physical Exam:    VS:  BP 120/76   Pulse 70   Ht '5\' 5"'$  (1.651 m)   Wt 158 lb (71.7 kg)   LMP 04/02/1988   SpO2 98%   BMI 26.29 kg/m     Wt Readings from Last 3 Encounters:  04/27/22 158 lb (71.7 kg)  02/02/22 150 lb 12.8 oz (68.4 kg)  01/09/22 155 lb 9.6 oz (70.6 kg)     GEN: Well nourished, well developed in no acute distress.  Appears younger than stated age CARDIAC: RRR, no murmurs, rubs, gallops RESPIRATORY:  Clear to auscultation without rales, wheezing or rhonchi        ASSESSMENT:    1. Persistent atrial fibrillation ([place])   2. Chronic diastolic congestive heart failure ([place])   3. Primary hypertension    PLAN:    In order of problems listed above:  #Persistent AF S/p ablation in January 2023. Extensive scarring noted in LA. Did not tolerate amiodarone. Continue eliquis. Continue metoprolol Maintaining sinus  rhythm.  Feeling well.  #Chronic diastolic HF NYHA II. Warm and dry. Continue current medical therapy.  #Hypertension At goal today.  Recommend checking blood pressures 1-2 times per week at home and recording the values.  Recommend bringing these recordings to the primary care physician.    Follow up in November.  Will try to alternate appointments with Dr. Burt Knack.     Medication Adjustments/Labs and Tests Ordered: Current medicines are reviewed at length with the patient today.  Concerns regarding medicines are outlined above.   Orders Placed This Encounter  Procedures   EKG 12-Lead   No orders of the defined types were placed in this encounter.  I,Mathew Stumpf,acting as a Education administrator for Vickie Epley, MD.,have documented all relevant documentation on the behalf of Vickie Epley, MD,as directed by  Vickie Epley, MD while in the presence of Vickie Epley, MD.  I, Vickie Epley, MD, have reviewed all documentation for this visit. The documentation on 04/27/22 for the exam, diagnosis, procedures, and orders are all accurate and complete.   Signed, Lars Mage, MD, [facility], [facility] 04/27/2022 8:18 AM    Electrophysiology [facility]

## 2022-04-27 NOTE — Patient Instructions (Signed)
Medication Instructions:  Your physician recommends that you continue on your current medications as directed. Please refer to the Current Medication list given to you today.  *If you need a refill on your cardiac medications before your next appointment, please call your pharmacy*  Follow-Up: At [facility], you and your health needs are our priority.  As part of our continuing mission to provide you with exceptional heart care, we have created designated Provider Care Teams.  These Care Teams include your primary Cardiologist (physician) and Advanced Practice Providers (APPs -  Physician Assistants and Nurse Practitioners) who all work together to provide you with the care you need, when you need it.  Your next appointment:   November 2024  Provider:   Lars Mage, MD

## 2022-05-10 ENCOUNTER — Encounter (HOSPITAL_COMMUNITY): Payer: Self-pay | Admitting: *Deleted

## 2022-05-28 ENCOUNTER — Other Ambulatory Visit: Payer: Self-pay | Admitting: Cardiovascular Disease

## 2022-05-28 NOTE — Telephone Encounter (Signed)
Prescription refill request for Eliquis received. Indication: afib  Last office visit:04/27/2022 Scr: 0.82, 10/19/2021 Age: 82 Weight: 71.7 kg   Refill sent.

## 2022-06-04 ENCOUNTER — Encounter: Payer: Self-pay | Admitting: Internal Medicine

## 2022-06-05 ENCOUNTER — Other Ambulatory Visit: Payer: Self-pay | Admitting: Cardiovascular Disease

## 2022-06-11 ENCOUNTER — Other Ambulatory Visit: Payer: Self-pay | Admitting: Internal Medicine

## 2022-06-11 DIAGNOSIS — K219 Gastro-esophageal reflux disease without esophagitis: Secondary | ICD-10-CM

## 2022-06-19 ENCOUNTER — Telehealth: Payer: Self-pay | Admitting: Internal Medicine

## 2022-06-19 NOTE — Telephone Encounter (Signed)
Pt has an ov scheduled with Dr. Henrene Pastor in June, offered an appt with an APP for sooner visit but pt wants to see Dr. Henrene Pastor. She reports she had been doing well taking the Levbid and it had helped so much with her abdominal cramping. States the past 2 weeks however she had been constipated and then the next day would have several diarrhea stools. Reports she is in a cycle now. She would like to no have to take the lomotil. Let her know Dr. Henrene Pastor is out of the office and we can get back to her with his recommendations upon his return. Please advise.

## 2022-06-19 NOTE — Telephone Encounter (Signed)
Inbound call from patient requesting to speak with a nurse in regards to abdominal pain. Please advise .

## 2022-06-19 NOTE — Telephone Encounter (Signed)
Left message for pt to call back  °

## 2022-06-26 NOTE — Telephone Encounter (Signed)
Have her try Citrucel 2 tablespoons daily.  This may help balance out the oscillating nature of her bowel issues. I have an opening on April 11 at 10 AM.  Have her see me then.  Thanks

## 2022-06-26 NOTE — Telephone Encounter (Signed)
Spoke with pt and she is aware of Dr. Blanch Media recommendations. Pt scheduled to see Dr. Henrene Pastor 07/17/22 at 11:40am, previous appt was already taken. Pt aware of appt.

## 2022-07-02 DIAGNOSIS — L821 Other seborrheic keratosis: Secondary | ICD-10-CM | POA: Diagnosis not present

## 2022-07-02 DIAGNOSIS — L570 Actinic keratosis: Secondary | ICD-10-CM | POA: Diagnosis not present

## 2022-07-02 DIAGNOSIS — L565 Disseminated superficial actinic porokeratosis (DSAP): Secondary | ICD-10-CM | POA: Diagnosis not present

## 2022-07-17 ENCOUNTER — Encounter: Payer: Self-pay | Admitting: Internal Medicine

## 2022-07-17 ENCOUNTER — Ambulatory Visit (INDEPENDENT_AMBULATORY_CARE_PROVIDER_SITE_OTHER): Payer: Medicare Other | Admitting: Internal Medicine

## 2022-07-17 VITALS — BP 126/68 | HR 74 | Ht 65.0 in | Wt 160.0 lb

## 2022-07-17 DIAGNOSIS — R194 Change in bowel habit: Secondary | ICD-10-CM

## 2022-07-17 DIAGNOSIS — K580 Irritable bowel syndrome with diarrhea: Secondary | ICD-10-CM

## 2022-07-17 DIAGNOSIS — K219 Gastro-esophageal reflux disease without esophagitis: Secondary | ICD-10-CM

## 2022-07-17 MED ORDER — HYOSCYAMINE SULFATE ER 0.375 MG PO TB12: 0.3750 mg | ORAL_TABLET | Freq: Every morning | ORAL | 3 refills | Status: DC

## 2022-07-17 MED ORDER — OMEPRAZOLE 40 MG PO CPDR: DELAYED_RELEASE_CAPSULE | ORAL | 3 refills | Status: DC

## 2022-07-17 MED ORDER — DIPHENOXYLATE-ATROPINE 2.5-0.025 MG PO TABS: 1.0000 | ORAL_TABLET | Freq: Two times a day (BID) | ORAL | 3 refills | Status: DC | PRN

## 2022-07-17 NOTE — Progress Notes (Signed)
HISTORY OF PRESENT ILLNESS:  Brittney Fox is a 82 y.o. female with GERD and diarrhea predominant irritable bowel syndrome.  Last seen in the office September 11, 2021.  Presents today for follow-up.  Request medication refill.  Patient tells me that since she has been taking Levbid once daily, overall her irritable bowel has been better.  She still has irregularity of bowel habits.  She has been trying to be more consistent with Citrucel supplementation.  She does notice that she may have multiple bowel movements in the morning.  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  Typically takes Imodium once or twice daily.  Averages about 10 times per week.  No significant constipation or abdominal pain.  Weight has been stable.  No bleeding.  Reflux is well-controlled on omeprazole.  She does request a refill of Levbid, omeprazole, and a prescription for Lomotil.  REVIEW OF SYSTEMS:  All non-GI ROS negative as otherwise stated in HPI except for muscle cramps, irregular heartbeat  Past Medical History:  Diagnosis Date   Anal fissure    as a child   Blood transfusion without reported diagnosis    Diabetes mellitus    managed by diet and exercise   Diverticulosis of colon (without mention of hemorrhage)    Fatty liver    GERD (gastroesophageal reflux disease)    Heart murmur    Hyperlipidemia    Irritable bowel syndrome    Lymphocytic colitis    chronic   Neuromuscular disorder    neuropathy   Osteoarthritis    Osteopenia 09/2007   Other mucopurulent conjunctivitis    Premature ventricular contractions    Raynaud's syndrome    Rosacea    opitcal   Spinal stenosis     Past Surgical History:  Procedure Laterality Date   ATRIAL FIBRILLATION ABLATION N/A 04/24/2021   Procedure: ATRIAL FIBRILLATION ABLATION;  Surgeon: Lanier Prude, MD;  Location: [facility];  Service: Cardiovascular;  Laterality: N/A;   CATARACT EXTRACTION Bilateral    CHOLECYSTECTOMY      COLONOSCOPY     dental implants     DILATION AND CURETTAGE OF UTERUS  1990   FOOT SURGERY     HIP SURGERY Bilateral 1973   Jaw Implants  2007   2018.2019   KNEE ARTHROSCOPY Right    lens implant     ovarian wedge resection  1964   REPLACEMENT TOTAL KNEE Bilateral 2005, 2007   TOTAL ABDOMINAL HYSTERECTOMY W/ BILATERAL SALPINGOOPHORECTOMY  1990   Excessive Bleeding    TOTAL KNEE ARTHROPLASTY Left 08/2003   TOTAL KNEE ARTHROPLASTY Right 08/2005    Social History Brittney Fox  reports that she quit smoking about 60 years ago. Her smoking use included cigarettes. She has never used smokeless tobacco. She reports that she does not drink alcohol and does not use drugs.  family history includes Breast cancer (age of onset: 64) in her sister; Diabetes in her paternal uncle; Heart disease in her father; Lung cancer in her sister; Osteoporosis in her mother; Ovarian cancer in her maternal grandmother.  Allergies  Allergen Reactions   Morphine Sulfate Swelling    tongue swelling   Codeine Phosphate     headache   Metformin Hcl Other (See Comments)   Sitagliptin Other (See Comments)       PHYSICAL EXAMINATION: Vital signs: BP 126/68   Pulse 74   Ht  (1.651 m)   Wt 160 lb (72.6 kg)  LMP 04/02/1988   BMI 26.63 kg/m   Constitutional: generally well-appearing, no acute distress Psychiatric: alert and oriented x3, cooperative Eyes: extraocular movements intact, anicteric, conjunctiva pink Mouth: oral pharynx moist, no lesions Neck: supple no lymphadenopathy Cardiovascular: Irregular heart rate and rhythm, no murmur Lungs: clear to auscultation bilaterally Abdomen: soft, nontender, nondistended, no obvious ascites, no peritoneal signs, normal bowel sounds, no organomegaly Rectal: Omitted Extremities: no clubbing or cyanosis.  1-2+ lower extremity edema bilaterally Skin: no lesions on visible extremities Neuro: No focal deficits.  Cranial nerves  intact  ASSESSMENT:  1.  Diarrhea predominant irritable bowel syndrome.  Managed with Levbid, fiber, and antidiarrheals 2.  GERD.  Well-managed on omeprazole 3.  Prior colonoscopy and upper endoscopy 2021   PLAN:  1.  Continue Levbid.  Prescription refilled.  Medication risk reviewed 2.  Prescribed Lomotil.  1 p.o. once or twice daily as needed.  Medication risk reviewed 3.  Refill omeprazole.  Medication risk reviewed 4.  Reflux precautions 5.  Continue fiber supplementation 6.  Routine office follow-up 1 year.  Contact the office in the interim for questions or problems Total time of 30 minutes was spent preparing to see the patient, obtaining interval history, performing medically appropriate physical examination, counseling and educating the patient regarding the above listed issues, answering questions, ordering multiple medications, defining follow-up interval, and documenting clinical information in the health record

## 2022-07-17 NOTE — Patient Instructions (Signed)
_______________________________________________________  If your blood pressure at your visit was 140/90 or greater, please contact your primary care physician to follow up on this.  _______________________________________________________  If you are age 81 or older, your body mass index should be between 23-30. Your Body mass index is 26.63 kg/m. If this is out of the aforementioned range listed, please consider follow up with your Primary Care Provider.  If you are age 24 or younger, your body mass index should be between 19-25. Your Body mass index is 26.63 kg/m. If this is out of the aformentioned range listed, please consider follow up with your Primary Care Provider.   ________________________________________________________  The [place] [facility] providers would like to encourage you to use [facility] to communicate with providers for non-urgent requests or questions.  Due to long hold times on the telephone, sending your provider a message by [facility] may be a faster and more efficient way to get a response.  Please allow 48 business hours for a response.  Please remember that this is for non-urgent requests.  _______________________________________________________  We have sent the following medications to your pharmacy for you to pick up at your convenience:  Omeprazole, Levbid, Imdodium  Please follow up in one year

## 2022-08-06 ENCOUNTER — Encounter: Payer: Self-pay | Admitting: Cardiovascular Disease

## 2022-08-06 ENCOUNTER — Ambulatory Visit: Payer: Medicare Other | Attending: Cardiovascular Disease | Admitting: Cardiovascular Disease

## 2022-08-06 VITALS — BP 132/80 | HR 60 | Ht 65.0 in | Wt 157.2 lb

## 2022-08-06 DIAGNOSIS — R252 Cramp and spasm: Secondary | ICD-10-CM | POA: Diagnosis not present

## 2022-08-06 DIAGNOSIS — I5032 Chronic diastolic (congestive) heart failure: Secondary | ICD-10-CM | POA: Diagnosis not present

## 2022-08-06 DIAGNOSIS — I4819 Other persistent atrial fibrillation: Secondary | ICD-10-CM | POA: Insufficient documentation

## 2022-08-06 DIAGNOSIS — I10 Essential (primary) hypertension: Secondary | ICD-10-CM | POA: Diagnosis not present

## 2022-08-06 NOTE — Patient Instructions (Signed)
Medication Instructions:  Your physician recommends that you continue on your current medications as directed. Please refer to the Current Medication list given to you today.  *If you need a refill on your cardiac medications before your next appointment, please call your pharmacy*   Lab Work: BMET, Magnesium today If you have labs (blood work) drawn today and your tests are completely normal, you will receive your results only by: MyChart Message (if you have MyChart) OR A paper copy in the mail If you have any lab test that is abnormal or we need to change your treatment, we will call you to review the results.   Testing/Procedures: NONE   Follow-Up: At [facility], you and your health needs are our priority.  As part of our continuing mission to provide you with exceptional heart care, we have created designated Provider Care Teams.  These Care Teams include your primary Cardiologist (physician) and Advanced Practice Providers (APPs -  Physician Assistants and Nurse Practitioners) who all work together to provide you with the care you need, when you need it.  We recommend signing up for the patient portal called "MyChart".  Sign up information is provided on this After Visit Summary.  MyChart is used to connect with patients for Virtual Visits (Telemedicine).  Patients are able to view lab/test results, encounter notes, upcoming appointments, etc.  Non-urgent messages can be sent to your provider as well.   To learn more about what you can do with MyChart, go to [email or web address].    Your next appointment:   1 year(s)  Provider:   Tonny Bollman, MD

## 2022-08-06 NOTE — Progress Notes (Signed)
Cardiology Office Note:    Date:  08/06/2022   ID:  Brittney Fox, DOB [birth year], MRN [id number]  PCP:  Alysia Penna, MD   [facility] Providers Cardiologist:  Tonny Bollman, MD Electrophysiologist:  Lanier Prude, MD     Referring MD: Alysia Penna, MD   Chief Complaint  Patient presents with   Atrial Fibrillation    History of Present Illness:    Brittney Fox is a 82 y.o. female with a hx of persistent atrial fibrillation, presenting for follow-up evaluation.  Comorbid conditions include hypertension, and chronic HFpEF.  The patient has undergone ablation by Dr. Lalla Brothers.  She was intolerant to amiodarone.  She has done well on a beta-blocker and has been maintaining sinus rhythm.  The patient is here alone today. She has a dog now and is a lot more active with the dog. States her steps have increased by about 3000 per day.  She feels well and is exercising regularly with no exertional symptoms. Today, she denies symptoms of palpitations, chest pain, shortness of breath, orthopnea, PND, lower extremity edema, dizziness, or syncope.   Past Medical History:  Diagnosis Date   Anal fissure    as a child   Blood transfusion without reported diagnosis    Diabetes mellitus    managed by diet and exercise   Diverticulosis of colon (without mention of hemorrhage)    Fatty liver    GERD (gastroesophageal reflux disease)    Heart murmur    Hyperlipidemia    Irritable bowel syndrome    Lymphocytic colitis    chronic   Neuromuscular disorder (HCC)    neuropathy   Osteoarthritis    Osteopenia 09/2007   Other mucopurulent conjunctivitis    Premature ventricular contractions    Raynaud's syndrome    Rosacea    opitcal   Spinal stenosis     Past Surgical History:  Procedure Laterality Date   ATRIAL FIBRILLATION ABLATION N/A 04/24/2021   Procedure: ATRIAL FIBRILLATION ABLATION;  Surgeon: Lanier Prude, MD;  Location: [facility];   Service: Cardiovascular;  Laterality: N/A;   CATARACT EXTRACTION Bilateral    CHOLECYSTECTOMY     COLONOSCOPY     dental implants     DILATION AND CURETTAGE OF UTERUS  1990   FOOT SURGERY     HIP SURGERY Bilateral 1973   Jaw Implants  2007   2018.2019   KNEE ARTHROSCOPY Right    lens implant     ovarian wedge resection  1964   REPLACEMENT TOTAL KNEE Bilateral 2005, 2007   TOTAL ABDOMINAL HYSTERECTOMY W/ BILATERAL SALPINGOOPHORECTOMY  1990   Excessive Bleeding    TOTAL KNEE ARTHROPLASTY Left 08/2003   TOTAL KNEE ARTHROPLASTY Right 08/2005    Current Medications: Current Meds  Medication Sig   acetaminophen (TYLENOL) 500 MG tablet Take 500 mg by mouth every 6 (six) hours as needed for moderate pain.   Cholecalciferol (VITAMIN D) 50 MCG (2000 UT) tablet Take 2,000 Units by mouth daily.   dicyclomine (BENTYL) 20 MG tablet TAKE ONE TABLET EVERY 6 HOURS AS NEEDED   diphenoxylate-atropine (LOMOTIL) 2.5-0.025 MG tablet Take 1 tablet by mouth 2 (two) times daily as needed for diarrhea or loose stools.   ELIQUIS 5 MG TABS tablet TAKE ONE TABLET TWICE DAILY   estradiol (ESTRACE) 0.1 MG/GM vaginal cream as needed.   fluconazole (DIFLUCAN) 150 MG tablet Take by mouth.   fluocinonide cream (LIDEX) 0.05 % Apply 1 application. topically as  needed.   glucose blood (ONETOUCH VERIO) test strip    hyoscyamine (LEVBID) 0.375 MG 12 hr tablet Take 1 tablet (0.375 mg total) by mouth every morning.   influenza vaccine adjuvanted (FLUAD) 0.5 ML injection Inject into the muscle.   loperamide (IMODIUM A-D) 2 MG tablet Take 2-4 mg by mouth 4 (four) times daily as needed for diarrhea or loose stools.   meclizine (ANTIVERT) 25 MG tablet Take 25 mg by mouth as needed.   metoprolol succinate (TOPROL XL) 25 MG 24 hr tablet Take 1 tablet (25 mg total) by mouth daily.   naproxen sodium (ALEVE) 220 MG tablet Take 220-440 mg by mouth daily as needed (pain).   omeprazole (PRILOSEC) 40 MG capsule TAKE ONE CAPSULE EACH  DAY   Polyvinyl Alcohol-Povidone (REFRESH OP) Place 1 drop into both eyes daily as needed (dry eyes).   potassium chloride (KLOR-CON) 10 MEQ tablet TAKE TWO TABLETS EVERY DAY   Probiotic Product (ALIGN PO) Take 1 capsule by mouth daily.   Psyllium (METAMUCIL PO) Take 1 Dose by mouth daily as needed (constipation). 1 dose = 1 tablespoon   Pyridoxine HCl (B-6 PO) Take 1 tablet by mouth at bedtime.   spironolactone-hydrochlorothiazide (ALDACTAZIDE) 25-25 MG per tablet TAKE ONE TABLET EACH DAY   temazepam (RESTORIL) 15 MG capsule Take 15 mg by mouth as needed.   vitamin B-12 (CYANOCOBALAMIN) 1000 MCG tablet Take 1,000 mcg by mouth daily.   [DISCONTINUED] hydrocortisone (ANUSOL-HC) 2.5 % rectal cream Place 1 application rectally at bedtime.     Allergies:   Morphine sulfate, Codeine phosphate, Metformin hcl, and Sitagliptin   Social History   Socioeconomic History   Marital status: Widowed    Spouse name: Not on file   Number of children: 2   Years of education: Not on file   Highest education level: Not on file  Occupational History   Occupation: REAL ESTATE    Employer: [employer]  Tobacco Use   Smoking status: Former    Types: Cigarettes    Quit date: 10/22/1961    Years since quitting: 60.8   Smokeless tobacco: Never  Vaping Use   Vaping Use: Never used  Substance and Sexual Activity   Alcohol use: No    Alcohol/week: 0.0 standard drinks of alcohol   Drug use: No   Sexual activity: Not Currently    Partners: Male    Birth control/protection: Surgical    Comment: hysterectomy  Other Topics Concern   Not on file  Social History Narrative   Not on file   Social Determinants of Health   Financial Resource Strain: Not on file  Food Insecurity: Not on file  Transportation Needs: Not on file  Physical Activity: Not on file  Stress: Not on file  Social Connections: Not on file     Family History: The patient's family history includes Breast cancer  (age of onset: 98) in her sister; Diabetes in her paternal uncle; Heart disease in her father; Lung cancer in her sister; Osteoporosis in her mother; Ovarian cancer in her maternal grandmother. There is no history of Colon cancer, Stomach cancer, Esophageal cancer, or Rectal cancer.  ROS:   Please see the history of present illness.    Positive for nocturnal leg cramps.  All other systems reviewed and are negative.  EKGs/Labs/Other Studies Reviewed:    The following studies were reviewed today: Cardiac Studies & Procedures     STRESS TESTS  MYOCARDIAL PERFUSION IMAGING 12/21/2020  Narrative  The study is normal. The study is low risk.   LV perfusion is normal. There is no evidence of ischemia. There is no evidence of infarction.   Left ventricular function is normal. Nuclear stress EF: 55 %. The left ventricular ejection fraction is normal (55-65%). End diastolic cavity size is normal.   Prior study available for comparison from 02/19/2005. No changes compared to prior study. No change from previous study 2006  Normal resting and stress perfusion. No ischemia or infarction EF 55% no change from study done 2006   ECHOCARDIOGRAM  ECHOCARDIOGRAM COMPLETE 02/20/2021  Narrative ECHOCARDIOGRAM REPORT    Patient Name:   AKEEMA BOURRET [facility] Date of Exam: 02/20/2021 Medical Rec #:  [id number]            Height:       65.0 in Accession #:    [id number]           Weight:       165.4 lb Date of Birth:  [birth year]            BSA:          1.825 m Patient Age:    80 years             BP:           126/74 mmHg Patient Gender: F                    HR:           64 bpm. Exam Location:  [address]  Procedure: 3D Echo, 2D Echo, Cardiac Doppler, Color Doppler and Strain Analysis  Indications:    I48.0 Paroxysmal Atrial Fibrillation.  History:        Patient has prior history of Echocardiogram examinations, most recent 02/18/2020. Risk Factors:Diabetes and  Dyslipidemia. Murmur.  Sonographer:    Daphine Deutscher RDCS Referring Phys: [id number] Rossie Muskrat LAMBERT  IMPRESSIONS   1. Left ventricular ejection fraction, by estimation, is 55 to 60%. The left ventricle has normal function. The left ventricle has no regional wall motion abnormalities. Left ventricular diastolic parameters were normal. The average left ventricular global longitudinal strain is -25.6 %. The global longitudinal strain is normal. 2. Right ventricular systolic function is normal. The right ventricular size is normal. 3. Left atrial size was moderately dilated. 4. Likely PFO present . Evidence of atrial level shunting detected by color flow Doppler. 5. Right atrial size was moderately dilated. 6. The mitral valve is abnormal. Trivial mitral valve regurgitation. No evidence of mitral stenosis. 7. The aortic valve is tricuspid. There is mild calcification of the aortic valve. Aortic valve regurgitation is not visualized. Aortic valve sclerosis is present, with no evidence of aortic valve stenosis. 8. The inferior vena cava is normal in size with greater than 50% respiratory variability, suggesting right atrial pressure of 3 mmHg.  FINDINGS Left Ventricle: Left ventricular ejection fraction, by estimation, is 55 to 60%. The left ventricle has normal function. The left ventricle has no regional wall motion abnormalities. The average left ventricular global longitudinal strain is -25.6 %. The global longitudinal strain is normal. The left ventricular internal cavity size was normal in size. There is no left ventricular hypertrophy. Left ventricular diastolic parameters were normal.  Right Ventricle: The right ventricular size is normal. No increase in right ventricular wall thickness. Right ventricular systolic function is normal.  Left Atrium: Left atrial size was moderately dilated.  Right Atrium: Right atrial size was moderately dilated.  Pericardium:  There is no  evidence of pericardial effusion.  Mitral Valve: The mitral valve is abnormal. There is mild thickening of the mitral valve leaflet(s). There is mild calcification of the mitral valve leaflet(s). Trivial mitral valve regurgitation. No evidence of mitral valve stenosis.  Tricuspid Valve: The tricuspid valve is normal in structure. Tricuspid valve regurgitation is mild . No evidence of tricuspid stenosis.  Aortic Valve: The aortic valve is tricuspid. There is mild calcification of the aortic valve. Aortic valve regurgitation is not visualized. Aortic valve sclerosis is present, with no evidence of aortic valve stenosis.  Pulmonic Valve: The pulmonic valve was normal in structure. Pulmonic valve regurgitation is not visualized. No evidence of pulmonic stenosis.  Aorta: The aortic root is normal in size and structure.  Venous: The inferior vena cava is normal in size with greater than 50% respiratory variability, suggesting right atrial pressure of 3 mmHg.  IAS/Shunts: Evidence of atrial level shunting detected by color flow Doppler.   LEFT VENTRICLE PLAX 2D LVIDd:         4.70 cm   Diastology LVIDs:         2.80 cm   LV e' medial:    7.72 cm/s LV PW:         0.80 cm   LV E/e' medial:  10.1 LV IVS:        0.80 cm   LV e' lateral:   10.80 cm/s LVOT diam:     2.20 cm   LV E/e' lateral: 7.2 LV SV:         68 LV SV Index:   37        2D Longitudinal Strain LVOT Area:     3.80 cm  2D Strain GLS (A2C):   -25.7 % 2D Strain GLS (A3C):   -25.7 % 2D Strain GLS (A4C):   -25.5 % 2D Strain GLS Avg:     -25.6 %  3D Volume EF: 3D EF:        60 % LV EDV:       116 ml LV ESV:       46 ml LV SV:        70 ml  RIGHT VENTRICLE             IVC RV Basal diam:  4.00 cm     IVC diam: 1.40 cm RV S prime:     12.60 cm/s TAPSE (M-mode): 2.5 cm  LEFT ATRIUM             Index        RIGHT ATRIUM           Index LA diam:        4.90 cm 2.69 cm/m   RA Area:     17.70 cm LA Vol (A2C):   48.7 ml 26.69  ml/m  RA Volume:   51.10 ml  28.00 ml/m LA Vol (A4C):   34.8 ml 19.07 ml/m LA Biplane Vol: 43.1 ml 23.62 ml/m AORTIC VALVE LVOT Vmax:   86.20 cm/s LVOT Vmean:  55.750 cm/s LVOT VTI:    0.180 m  AORTA Ao Root diam: 3.20 cm Ao Asc diam:  3.60 cm  MITRAL VALVE               TRICUSPID VALVE MV Area (PHT): 3.60 cm    TR Peak grad:   40.2 mmHg MV Decel Time: 211 msec    TR Vmax:        317.00 cm/s  MV E velocity: 77.60 cm/s MV A velocity: 52.00 cm/s  SHUNTS MV E/A ratio:  1.49        Systemic VTI:  0.18 m Systemic Diam: 2.20 cm  Charlton Haws MD Electronically signed by Charlton Haws MD Signature Date/Time: 02/20/2021/2:36:33 PM    Final    MONITORS  LONG TERM MONITOR (3-14 DAYS) 04/16/2022  Narrative Patch Wear Time:  6 days and 18 hours (2023-12-15T12:09:05-498 to 2023-12-22T06:33:26-0500)  Patient had a min HR of 44 bpm, max HR of 154 bpm, and avg HR of 67 bpm. Predominant underlying rhythm was Sinus Rhythm. 269 Supraventricular Tachycardia runs occurred, the run with the fastest interval lasting 11 beats with a max rate of 154 bpm, the longest lasting 11.5 secs with an avg rate of 109 bpm. Some episodes of Supraventricular Tachycardia may be possible Atrial Tachycardia with variable block. Isolated SVEs were occasional (2.0%, 11675), SVE Couplets were rare (<1.0%, 2076), and SVE Triplets were rare (<1.0%, 568). Isolated VEs were occasional (1.4%, 8427), VE Couplets were rare (<1.0%, 43), and VE Triplets were rare (<1.0%, 1). Ventricular Bigeminy and Trigeminy were present.  Summary: The basic rhythm is normal sinus with an average HR of 67 bpm. There are occasional supraventricular ectopics and short supraventricular runs up to 12 seconds. Occasional PVC's with a 1.4% burden. No atrial fibrillation or flutter.          EKG:  EKG is not ordered today.    Recent Labs: 09/01/2021: Hemoglobin 16.1; Platelets 256 10/19/2021: ALT 24; BUN 10; Creatinine, Ser 0.82; Potassium 4.0;  Sodium 130 03/12/2022: TSH 3.680  Recent Lipid Panel    Component Value Date/Time   CHOL 144 09/13/2015 0734   TRIG 87 09/13/2015 0734   TRIG 88 02/06/2006 1028   HDL 59 09/13/2015 0734   CHOLHDL 2.4 09/13/2015 0734   VLDL 17 09/13/2015 0734   LDLCALC 68 09/13/2015 0734   LDLDIRECT 150.2 11/28/2010 1131                Physical Exam:    VS:  BP 132/80   Pulse 60   Ht 5\' 5"  (1.651 m)   Wt 157 lb 3.2 oz (71.3 kg)   LMP 04/02/1988   SpO2 99%   BMI 26.16 kg/m     Wt Readings from Last 3 Encounters:  08/06/22 157 lb 3.2 oz (71.3 kg)  07/17/22 160 lb (72.6 kg)  04/27/22 158 lb (71.7 kg)     GEN:  Well nourished, well developed in no acute distress HEENT: Normal NECK: No JVD; No carotid bruits LYMPHATICS: No lymphadenopathy CARDIAC: RRR, no murmurs, rubs, gallops RESPIRATORY:  Clear to auscultation without rales, wheezing or rhonchi  ABDOMEN: Soft, non-tender, non-distended MUSCULOSKELETAL:  No edema; No deformity  SKIN: Warm and dry NEUROLOGIC:  Alert and oriented x 3 PSYCHIATRIC:  Normal affect   ASSESSMENT:    1. Persistent atrial fibrillation (HCC)   2. Chronic diastolic congestive heart failure (HCC)   3. Essential hypertension   4. Muscle cramping    PLAN:    In order of problems listed above:  The patient is maintaining sinus rhythm.  She is completely asymptomatic at this time.  Tolerating metoprolol succinate and apixaban without problems.  I will continue to alternate her visits with Dr. Lalla Brothers and I will plan to see her back next year. Doing well.  NYHA functional class I.  Appears euvolemic on exam.  Treated with spironolactone/HCTZ.  No changes made today.  Will check an electrolyte panel  to include magnesium since she is having leg cramps. Blood pressure optimally controlled on metoprolol succinate, spironolactone, and HCTZ.     Medication Adjustments/Labs and Tests Ordered: Current medicines are reviewed at length with the patient today.   Concerns regarding medicines are outlined above.  Orders Placed This Encounter  Procedures   Basic metabolic panel   Magnesium   No orders of the defined types were placed in this encounter.   Patient Instructions  Medication Instructions:  Your physician recommends that you continue on your current medications as directed. Please refer to the Current Medication list given to you today.  *If you need a refill on your cardiac medications before your next appointment, please call your pharmacy*   Lab Work: BMET, Magnesium today If you have labs (blood work) drawn today and your tests are completely normal, you will receive your results only by: MyChart Message (if you have MyChart) OR A paper copy in the mail If you have any lab test that is abnormal or we need to change your treatment, we will call you to review the results.   Testing/Procedures: NONE   Follow-Up: At [facility], you and your health needs are our priority.  As part of our continuing mission to provide you with exceptional heart care, we have created designated Provider Care Teams.  These Care Teams include your primary Cardiologist (physician) and Advanced Practice Providers (APPs -  Physician Assistants and Nurse Practitioners) who all work together to provide you with the care you need, when you need it.  We recommend signing up for the patient portal called "MyChart".  Sign up information is provided on this After Visit Summary.  MyChart is used to connect with patients for Virtual Visits (Telemedicine).  Patients are able to view lab/test results, encounter notes, upcoming appointments, etc.  Non-urgent messages can be sent to your provider as well.   To learn more about what you can do with MyChart, go to [email or web address].    Your next appointment:   1 year(s)  Provider:   Tonny Bollman, MD        Signed, Tonny Bollman, MD  08/06/2022 5:06 PM    [facility]

## 2022-08-07 ENCOUNTER — Telehealth: Payer: Self-pay | Admitting: Cardiovascular Disease

## 2022-08-07 LAB — BASIC METABOLIC PANEL
BUN/Creatinine Ratio: 17 (ref 12–28)
BUN: 13 mg/dL (ref 8–27)
CO2: 25 mmol/L (ref 20–29)
Calcium: 9.9 mg/dL (ref 8.7–10.3)
Chloride: 92 mmol/L — ABNORMAL LOW (ref 96–106)
Creatinine, Ser: 0.75 mg/dL (ref 0.57–1.00)
Glucose: 124 mg/dL — ABNORMAL HIGH (ref 70–99)
Potassium: 3.5 mmol/L (ref 3.5–5.2)
Sodium: 135 mmol/L (ref 134–144)
eGFR: 80 mL/min/{1.73_m2} (ref >59)

## 2022-08-07 LAB — MAGNESIUM: Magnesium: 1.7 mg/dL (ref 1.6–2.3)

## 2022-08-07 MED ORDER — POTASSIUM CHLORIDE ER 10 MEQ PO TBCR: 20.0000 meq | EXTENDED_RELEASE_TABLET | Freq: Two times a day (BID) | ORAL | 3 refills | Status: DC

## 2022-08-07 NOTE — Telephone Encounter (Signed)
-----   Message from Tonny Bollman, MD sent at 08/07/2022  9:32 AM EDT ----- Please double the patient's potassium (increase to 20 meq BID). Should replete Mg - will ask PharmD to give input on best oral repletion MgOxide versus a slow release Mg supplement. Pt with muscle cramps. thx

## 2022-08-07 NOTE — Telephone Encounter (Signed)
Patient is requesting call back to go over results.  

## 2022-08-07 NOTE — Telephone Encounter (Signed)
Called and spoke with patient who verbalized understanding of increasing Potassium to twice daily. Sent in using tablets per her request. Also reviewed pharmacist's recommendation on magnesium oxide 400mg  daily which she can pick up over-the-counter.  Supple, Emeline Darling, RPH-CPP  Tonny Bollman, MD; Eugene Gavia K Mag oxide tends to be used most commonly, would add mag oxide 200mg  BID

## 2022-08-07 NOTE — Telephone Encounter (Signed)
Spoke with Jewelz-see other phone encounter from today.

## 2022-08-14 DIAGNOSIS — D6869 Other thrombophilia: Secondary | ICD-10-CM | POA: Diagnosis not present

## 2022-08-14 DIAGNOSIS — E1169 Type 2 diabetes mellitus with other specified complication: Secondary | ICD-10-CM | POA: Diagnosis not present

## 2022-08-14 DIAGNOSIS — D692 Other nonthrombocytopenic purpura: Secondary | ICD-10-CM | POA: Diagnosis not present

## 2022-08-14 DIAGNOSIS — I7300 Raynaud's syndrome without gangrene: Secondary | ICD-10-CM | POA: Diagnosis not present

## 2022-08-14 DIAGNOSIS — I4891 Unspecified atrial fibrillation: Secondary | ICD-10-CM | POA: Diagnosis not present

## 2022-08-14 DIAGNOSIS — I110 Hypertensive heart disease with heart failure: Secondary | ICD-10-CM | POA: Diagnosis not present

## 2022-08-14 DIAGNOSIS — I5030 Unspecified diastolic (congestive) heart failure: Secondary | ICD-10-CM | POA: Diagnosis not present

## 2022-08-14 DIAGNOSIS — E039 Hypothyroidism, unspecified: Secondary | ICD-10-CM | POA: Diagnosis not present

## 2022-08-14 DIAGNOSIS — G4700 Insomnia, unspecified: Secondary | ICD-10-CM | POA: Diagnosis not present

## 2022-08-14 DIAGNOSIS — K2100 Gastro-esophageal reflux disease with esophagitis, without bleeding: Secondary | ICD-10-CM | POA: Diagnosis not present

## 2022-08-14 DIAGNOSIS — K589 Irritable bowel syndrome without diarrhea: Secondary | ICD-10-CM | POA: Diagnosis not present

## 2022-09-03 DIAGNOSIS — R92323 Mammographic fibroglandular density, bilateral breasts: Secondary | ICD-10-CM | POA: Diagnosis not present

## 2022-09-03 DIAGNOSIS — Z1231 Encounter for screening mammogram for malignant neoplasm of breast: Secondary | ICD-10-CM | POA: Diagnosis not present

## 2022-09-12 ENCOUNTER — Ambulatory Visit: Payer: Medicare Other | Admitting: Internal Medicine

## 2022-09-13 DIAGNOSIS — E11649 Type 2 diabetes mellitus with hypoglycemia without coma: Secondary | ICD-10-CM | POA: Diagnosis not present

## 2022-09-13 DIAGNOSIS — R7309 Other abnormal glucose: Secondary | ICD-10-CM | POA: Diagnosis not present

## 2022-09-13 DIAGNOSIS — E032 Hypothyroidism due to medicaments and other exogenous substances: Secondary | ICD-10-CM | POA: Diagnosis not present

## 2022-09-17 DIAGNOSIS — N3000 Acute cystitis without hematuria: Secondary | ICD-10-CM | POA: Diagnosis not present

## 2022-09-20 DIAGNOSIS — Z961 Presence of intraocular lens: Secondary | ICD-10-CM | POA: Diagnosis not present

## 2022-09-20 DIAGNOSIS — E113291 Type 2 diabetes mellitus with mild nonproliferative diabetic retinopathy without macular edema, right eye: Secondary | ICD-10-CM | POA: Diagnosis not present

## 2022-09-27 ENCOUNTER — Telehealth (HOSPITAL_BASED_OUTPATIENT_CLINIC_OR_DEPARTMENT_OTHER): Payer: Self-pay | Admitting: *Deleted

## 2022-09-27 MED ORDER — FLUCONAZOLE 150 MG PO TABS: 150.0000 mg | ORAL_TABLET | Freq: Every day | ORAL | 0 refills | Status: DC

## 2022-09-27 NOTE — Telephone Encounter (Signed)
TC from pt.  Pt states has yeast sx's after taking ABX of Bactrim given by another PCP.  Ok per Dr Hyacinth Meeker verbally.  KW CMA

## 2022-11-19 ENCOUNTER — Other Ambulatory Visit: Payer: Self-pay | Admitting: Cardiovascular Disease

## 2022-11-19 DIAGNOSIS — I480 Paroxysmal atrial fibrillation: Secondary | ICD-10-CM

## 2022-11-19 NOTE — Telephone Encounter (Signed)
Eliquis 5mg  refill request received. Patient is 82 years old, weight-71.3kg, Crea-0.75 on 08/06/22, Diagnosis-Afib, and last seen by Dr. Excell Seltzer on 08/06/22. Dose is appropriate based on dosing criteria. Will send in refill to requested pharmacy.

## 2022-12-03 ENCOUNTER — Other Ambulatory Visit: Payer: Self-pay

## 2022-12-03 ENCOUNTER — Emergency Department (HOSPITAL_BASED_OUTPATIENT_CLINIC_OR_DEPARTMENT_OTHER)
Admission: EM | Admit: 2022-12-03 | Discharge: 2022-12-03 | Disposition: A | Discharge status: Home / Self Care | Payer: Medicare Other | Attending: Emergency Medicine | Admitting: Emergency Medicine

## 2022-12-03 DIAGNOSIS — L03115 Cellulitis of right lower limb: Secondary | ICD-10-CM | POA: Diagnosis not present

## 2022-12-03 DIAGNOSIS — Z7901 Long term (current) use of anticoagulants: Secondary | ICD-10-CM | POA: Insufficient documentation

## 2022-12-03 NOTE — Discharge Instructions (Addendum)
Please continue taking the doxycycline prescribed this treatment for cellulitis.  Continue taking a photo of your wound every day to track his progression.  If the redness is continuing to extend up your leg or you begin having fevers or shaking chills, return to the emergency department.

## 2022-12-03 NOTE — ED Triage Notes (Signed)
Pt to ED c/o cellulitis to right foot. Pt had tele health visit on Friday . Started on doxy Sunday, pt reports redness is going down.

## 2022-12-03 NOTE — ED Provider Notes (Signed)
EMERGENCY DEPARTMENT AT [facility] Provider Note   CSN: [id number] Arrival date & time: 12/03/22  1259     History  Chief Complaint  Patient presents with   Cellulitis    Brittney Fox is a 82 y.o. female presented to the ED with complaint of the redness in her right lower extremity.  Patient reports that she noticed this about 2 days ago on her right lower leg.  She was started on doxycycline is taken 3 doses of this.  She does not feel the redness has gotten any worse.  She denies fevers or shaking chills.  She denies any known trauma or insect stings.    Prior to starting doxycycline the patient may have taken amoxicillin and Flagyl for a dental prophylaxis.  HPI     Home Medications Prior to Admission medications   Medication Sig Start Date End Date Taking? Authorizing Provider  acetaminophen (TYLENOL) 500 MG tablet Take 500 mg by mouth every 6 (six) hours as needed for moderate pain.    [provider]  Cholecalciferol (VITAMIN D) 50 MCG (2000 UT) tablet Take 2,000 Units by mouth daily.    [provider]  dicyclomine (BENTYL) 20 MG tablet TAKE ONE TABLET EVERY 6 HOURS AS NEEDED 10/10/20   Hilarie Fredrickson, MD  diphenoxylate-atropine (LOMOTIL) 2.5-0.025 MG tablet Take 1 tablet by mouth 2 (two) times daily as needed for diarrhea or loose stools. 07/17/22   Hilarie Fredrickson, MD  ELIQUIS 5 MG TABS tablet TAKE ONE TABLET TWICE DAILY 11/19/22   Tonny Bollman, MD  estradiol (ESTRACE) 0.1 MG/GM vaginal cream as needed. 11/16/21   [provider]  fluconazole (DIFLUCAN) 150 MG tablet Take by mouth. 01/30/22   [provider]  fluconazole (DIFLUCAN) 150 MG tablet Take 1 tablet (150 mg total) by mouth daily. Repeat in 72 hours if needed 09/27/22   Jerene Bears, MD  fluocinonide cream (LIDEX) 0.05 % Apply 1 application. topically as needed.    [provider]  glucose blood (ONETOUCH VERIO) test strip  04/27/15   [provider]  hyoscyamine (LEVBID) 0.375 MG 12 hr tablet Take 1 tablet (0.375 mg total) by mouth every morning. 07/17/22   Hilarie Fredrickson, MD  influenza vaccine adjuvanted (FLUAD) 0.5 ML injection Inject into the muscle. 01/04/22   Judyann Munson, MD  loperamide (IMODIUM A-D) 2 MG tablet Take 2-4 mg by mouth 4 (four) times daily as needed for diarrhea or loose stools.    [provider]  meclizine (ANTIVERT) 25 MG tablet Take 25 mg by mouth as needed. 07/15/17   [provider]  metoprolol succinate (TOPROL XL) 25 MG 24 hr tablet Take 1 tablet (25 mg total) by mouth daily. 02/02/22   Tonny Bollman, MD  naproxen sodium (ALEVE) 220 MG tablet Take 220-440 mg by mouth daily as needed (pain).    [provider]  omeprazole (PRILOSEC) 40 MG capsule TAKE ONE CAPSULE EACH DAY 07/17/22   Hilarie Fredrickson, MD  Polyvinyl Alcohol-Povidone (REFRESH OP) Place 1 drop into both eyes daily as needed (dry eyes).    [provider]  potassium chloride (KLOR-CON) 10 MEQ tablet Take 2 tablets (20 mEq total) by mouth 2 (two) times daily. 08/07/22   Tonny Bollman, MD  Probiotic Product (ALIGN PO) Take 1 capsule by mouth daily.    [provider]  Psyllium (METAMUCIL PO) Take 1 Dose by mouth daily as needed (constipation). 1 dose = 1 tablespoon  [provider]  Pyridoxine HCl (B-6 PO) Take 1 tablet by mouth at bedtime.    [provider]  spironolactone-hydrochlorothiazide (ALDACTAZIDE) 25-25 MG per tablet TAKE ONE TABLET EACH DAY 04/23/13   Swords, Valetta Mole, MD  temazepam (RESTORIL) 15 MG capsule Take 15 mg by mouth as needed. 06/09/21   [provider]  vitamin B-12 (CYANOCOBALAMIN) 1000 MCG tablet Take 1,000 mcg by mouth daily.    [provider]      Allergies    Morphine sulfate, Codeine phosphate, Metformin hcl, and Sitagliptin    Review of Systems   Review of Systems  Physical Exam Updated Vital Signs BP 122/81   Pulse 70   Temp  98.2 F (36.8 C) (Oral)   Resp 16   Ht 5\' 5"  (1.651 m)   Wt 63.5 kg   LMP 04/02/1988   SpO2 99%   BMI 23.30 kg/m  Physical Exam Constitutional:      General: She is not in acute distress. HENT:     Head: Normocephalic and atraumatic.  Eyes:     Conjunctiva/sclera: Conjunctivae normal.     Pupils: Pupils are equal, round, and reactive to light.  Cardiovascular:     Rate and Rhythm: Normal rate and regular rhythm.  Pulmonary:     Effort: Pulmonary effort is normal. No respiratory distress.  Abdominal:     General: There is no distension.     Tenderness: There is no abdominal tenderness.  Skin:    General: Skin is warm and dry.     Comments: Erythema and tenderness in a circular distribution on the right lower extremity overlying the anterior leg, with no streaking erythema  Neurological:     General: No focal deficit present.     Mental Status: She is alert. Mental status is at baseline.  Psychiatric:        Mood and Affect: Mood normal.        Behavior: Behavior normal.     ED Results / Procedures / Treatments   Labs (all labs ordered are listed, but only abnormal results are displayed) Labs Reviewed - No data to display  EKG None  Radiology No results found.  Procedures Procedures    Medications Ordered in ED Medications - No data to display  ED Course/ Medical Decision Making/ A&P                                 Medical Decision Making  Patient is presenting to ED with concern for redness and warmth of the right lower extremity, the anterior leg.  I suspect is likely consistent with a cellulitis.  Also on the differential would be a superficial thrombophlebitis.  I doubt DVT with the location of this lesion.  Doubt necrotizing fasciitis.  No indication for x-ray imaging or blood test at this time.  The patient is afebrile well-appearing.  I doubt this is sepsis.  I do think she is on excellent coverage for staph infection and potentially MRSA with her  doxycycline.  She has been taken this for less than 2 days and therefore I do not think she has failed outpatient antibiotics.  Per her report the redness has not spread at all, per my review of the imaging that she had from home, her prior photo from yesterday,and  the wound appears roughly the same.  I would advise that she continue with the antibiotic to complete at least  1 full week of treatment.  If the redness is extending or she develops systemic symptoms she will need to return to the ER.  She verbalized understanding and agreement.        Final Clinical Impression(s) / ED Diagnoses Final diagnoses:  Cellulitis of right lower extremity    Rx / DC Orders ED Discharge Orders     None         Terald Sleeper, MD 12/03/22 1501

## 2022-12-19 ENCOUNTER — Telehealth: Payer: Self-pay

## 2022-12-19 DIAGNOSIS — E785 Hyperlipidemia, unspecified: Secondary | ICD-10-CM | POA: Diagnosis not present

## 2022-12-19 DIAGNOSIS — I5030 Unspecified diastolic (congestive) heart failure: Secondary | ICD-10-CM | POA: Diagnosis not present

## 2022-12-19 DIAGNOSIS — E039 Hypothyroidism, unspecified: Secondary | ICD-10-CM | POA: Diagnosis not present

## 2022-12-19 DIAGNOSIS — E875 Hyperkalemia: Secondary | ICD-10-CM | POA: Diagnosis not present

## 2022-12-19 DIAGNOSIS — E559 Vitamin D deficiency, unspecified: Secondary | ICD-10-CM | POA: Diagnosis not present

## 2022-12-19 DIAGNOSIS — E1169 Type 2 diabetes mellitus with other specified complication: Secondary | ICD-10-CM | POA: Diagnosis not present

## 2022-12-19 DIAGNOSIS — I110 Hypertensive heart disease with heart failure: Secondary | ICD-10-CM | POA: Diagnosis not present

## 2022-12-19 NOTE — Telephone Encounter (Signed)
Transition Care Management Follow-up Telephone Call Date of discharge and from where: 12/03/2022 [facility] How have you been since you were released from the hospital? Patient stated she is feeling better and her leg is healing. Any questions or concerns? No  Items Reviewed: Did the pt receive and understand the discharge instructions provided? Yes  Medications obtained and verified?  No medication prescribed patient to continue doxycycline. Other? No  Any new allergies since your discharge? No  Dietary orders reviewed? Yes Do you have support at home? Yes   Follow up appointments reviewed:  PCP Hospital f/u appt confirmed? No  Scheduled to see  on  @ . Specialist Hospital f/u appt confirmed? No  Scheduled to see  on  @ . Are transportation arrangements needed? No  If their condition worsens, is the pt aware to call PCP or go to the Emergency Dept.? Yes Was the patient provided with contact information for the PCP's office or ED? Yes Was to pt encouraged to call back with questions or concerns? Yes  Kerrigan Gombos [facility]  [facility], [facility] Guide Direct Dial: [phone]  Website: [email or web address]

## 2022-12-25 DIAGNOSIS — D692 Other nonthrombocytopenic purpura: Secondary | ICD-10-CM | POA: Diagnosis not present

## 2022-12-25 DIAGNOSIS — Z23 Encounter for immunization: Secondary | ICD-10-CM | POA: Diagnosis not present

## 2022-12-25 DIAGNOSIS — E039 Hypothyroidism, unspecified: Secondary | ICD-10-CM | POA: Diagnosis not present

## 2022-12-25 DIAGNOSIS — D6869 Other thrombophilia: Secondary | ICD-10-CM | POA: Diagnosis not present

## 2022-12-25 DIAGNOSIS — E538 Deficiency of other specified B group vitamins: Secondary | ICD-10-CM | POA: Diagnosis not present

## 2022-12-25 DIAGNOSIS — E1169 Type 2 diabetes mellitus with other specified complication: Secondary | ICD-10-CM | POA: Diagnosis not present

## 2022-12-25 DIAGNOSIS — I4891 Unspecified atrial fibrillation: Secondary | ICD-10-CM | POA: Diagnosis not present

## 2022-12-25 DIAGNOSIS — I5030 Unspecified diastolic (congestive) heart failure: Secondary | ICD-10-CM | POA: Diagnosis not present

## 2022-12-25 DIAGNOSIS — Z0000 Encounter for general adult medical examination without abnormal findings: Secondary | ICD-10-CM | POA: Diagnosis not present

## 2022-12-25 DIAGNOSIS — R82998 Other abnormal findings in urine: Secondary | ICD-10-CM | POA: Diagnosis not present

## 2022-12-25 DIAGNOSIS — K589 Irritable bowel syndrome without diarrhea: Secondary | ICD-10-CM | POA: Diagnosis not present

## 2022-12-25 DIAGNOSIS — I7300 Raynaud's syndrome without gangrene: Secondary | ICD-10-CM | POA: Diagnosis not present

## 2022-12-25 DIAGNOSIS — I110 Hypertensive heart disease with heart failure: Secondary | ICD-10-CM | POA: Diagnosis not present

## 2022-12-25 DIAGNOSIS — G4700 Insomnia, unspecified: Secondary | ICD-10-CM | POA: Diagnosis not present

## 2022-12-25 DIAGNOSIS — Z1339 Encounter for screening examination for other mental health and behavioral disorders: Secondary | ICD-10-CM | POA: Diagnosis not present

## 2022-12-25 DIAGNOSIS — Z1331 Encounter for screening for depression: Secondary | ICD-10-CM | POA: Diagnosis not present

## 2023-01-25 ENCOUNTER — Other Ambulatory Visit: Payer: Self-pay | Admitting: Cardiovascular Disease

## 2023-02-06 ENCOUNTER — Encounter: Payer: Self-pay | Admitting: Internal Medicine

## 2023-02-06 ENCOUNTER — Ambulatory Visit (INDEPENDENT_AMBULATORY_CARE_PROVIDER_SITE_OTHER): Payer: Medicare Other | Admitting: Internal Medicine

## 2023-02-06 VITALS — BP 120/80 | HR 56 | Ht 65.0 in | Wt 147.0 lb

## 2023-02-06 DIAGNOSIS — K219 Gastro-esophageal reflux disease without esophagitis: Secondary | ICD-10-CM

## 2023-02-06 DIAGNOSIS — K580 Irritable bowel syndrome with diarrhea: Secondary | ICD-10-CM

## 2023-02-06 DIAGNOSIS — R197 Diarrhea, unspecified: Secondary | ICD-10-CM | POA: Diagnosis not present

## 2023-02-06 DIAGNOSIS — K589 Irritable bowel syndrome without diarrhea: Secondary | ICD-10-CM

## 2023-02-06 MED ORDER — HYOSCYAMINE SULFATE ER 0.375 MG PO TB12: 0.3750 mg | ORAL_TABLET | Freq: Every morning | ORAL | 3 refills | Status: DC

## 2023-02-06 MED ORDER — DIPHENOXYLATE-ATROPINE 2.5-0.025 MG PO TABS: 1.0000 | ORAL_TABLET | Freq: Two times a day (BID) | ORAL | 3 refills | Status: DC | PRN

## 2023-02-06 MED ORDER — OMEPRAZOLE 40 MG PO CPDR
DELAYED_RELEASE_CAPSULE | ORAL | 3 refills | Status: DC
Start: 2023-02-06 — End: 2023-09-03

## 2023-02-06 MED ORDER — RIFAXIMIN 550 MG PO TABS: 550.0000 mg | ORAL_TABLET | Freq: Three times a day (TID) | ORAL | 2 refills | Status: DC

## 2023-02-06 NOTE — Progress Notes (Signed)
HISTORY OF PRESENT ILLNESS:  Brittney Fox is a 82 y.o. female with GERD and diarrhea predominant irritable bowel syndrome.  She presents today for follow-up.  She was last evaluated July 17, 2022.  See that dictation.  For GERD she continues on omeprazole.  For her IBS-D she takes Levbid once daily and Imodium once or twice daily.  Occasionally Lomotil.  She reports ongoing problems with abdominal cramping and urgency and loose stools.  Not much different than 6 months ago.  She request a refill of her regular GI medications.  She also inquires about other possible options for her symptoms.  Her GERD is well-controlled on PPI.  Last upper endoscopy February 2021.  Last colonoscopy February 2021.  She has aged out of surveillance.  REVIEW OF SYSTEMS:  All non-GI ROS negative unless otherwise stated in the HPI except for irregular heartbeat, muscle cramps, ankle swelling  Past Medical History:  Diagnosis Date   Anal fissure    as a child   Blood transfusion without reported diagnosis    Diabetes mellitus    managed by diet and exercise   Diverticulosis of colon (without mention of hemorrhage)    Fatty liver    GERD (gastroesophageal reflux disease)    Heart murmur    Hyperlipidemia    Irritable bowel syndrome    Lymphocytic colitis    chronic   Neuromuscular disorder (HCC)    neuropathy   Osteoarthritis    Osteopenia 09/2007   Other mucopurulent conjunctivitis    Premature ventricular contractions    Raynaud's syndrome    Rosacea    opitcal   Spinal stenosis     Past Surgical History:  Procedure Laterality Date   ATRIAL FIBRILLATION ABLATION N/A 04/24/2021   Procedure: ATRIAL FIBRILLATION ABLATION;  Surgeon: Lanier Prude, MD;  Location: [facility] INVASIVE CV LAB;  Service: Cardiovascular;  Laterality: N/A;   CATARACT EXTRACTION Bilateral    CHOLECYSTECTOMY     COLONOSCOPY     dental implants     DILATION AND CURETTAGE OF UTERUS  1990   FOOT SURGERY     HIP SURGERY  Bilateral 1973   Jaw Implants  2007   2018.2019   KNEE ARTHROSCOPY Right    lens implant     ovarian wedge resection  1964   REPLACEMENT TOTAL KNEE Bilateral 2005, 2007   TOTAL ABDOMINAL HYSTERECTOMY W/ BILATERAL SALPINGOOPHORECTOMY  1990   Excessive Bleeding    TOTAL KNEE ARTHROPLASTY Left 08/2003   TOTAL KNEE ARTHROPLASTY Right 08/2005    Social History Brittney Fox  reports that she quit smoking about 61 years ago. Her smoking use included cigarettes. She has never used smokeless tobacco. She reports that she does not drink alcohol and does not use drugs.  family history includes Breast cancer (age of onset: 36) in her sister; Diabetes in her paternal uncle; Heart disease in her father; Lung cancer in her sister; Osteoporosis in her mother; Ovarian cancer in her maternal grandmother.  Allergies  Allergen Reactions   Morphine Sulfate Swelling    tongue swelling   Codeine Phosphate     headache   Metformin Hcl Other (See Comments)   Sitagliptin Other (See Comments)       PHYSICAL EXAMINATION: Vital signs: BP 120/80   Pulse (!) 56   Ht 5\' 5"  (1.651 m)   Wt 147 lb (66.7 kg)   LMP 04/02/1988   BMI 24.46 kg/m   Constitutional: generally well-appearing, no acute distress Psychiatric: alert and  oriented x3, cooperative Eyes: extraocular movements intact, anicteric, conjunctiva pink Mouth: oral pharynx moist, no lesions Neck: supple no lymphadenopathy Cardiovascular: heart regular rate and rhythm, no murmur Lungs: clear to auscultation bilaterally Abdomen: soft, nontender, nondistended, no obvious ascites, no peritoneal signs, normal bowel sounds, no organomegaly Rectal: Omitted Extremities: no clubbing or cyanosis.  1+ lower extremity edema bilaterally Skin: no lesions on visible extremities Neuro: No focal deficits.  Cranial nerves intact   ASSESSMENT:  1.  IBS-D.  Ongoing 2.  GERD.  Symptoms controlled with PPI 3.  General Medical problems.   Stable   PLAN:  1.  Refill regular GI medications (omeprazole, Levbid, Lomotil) 2.  Prescribe Xifaxan 550 mg p.o. 3 times daily x 2 weeks.  2 refills provided.  Medication effects and risks reviewed.  May use on demand if helpful. 3.  Routine GI follow-up 1 year A total time of 30 minutes was spent preparing to see the patient, obtaining comprehensive interval history, performing medically appropriate physical examination, counseling and educating the patient regarding above listed issues, refilling medications, ordering new medication, defining follow-up interval, and documenting clinical information in the health record

## 2023-02-06 NOTE — Patient Instructions (Signed)
We have sent the following medications to your pharmacy for you to pick up at your convenience:  Lomotil, Levbid, Omeprazole, Xifaxan.  Please follow up in one year.  _______________________________________________________  If your blood pressure at your visit was 140/90 or greater, please contact your primary care physician to follow up on this.  _______________________________________________________  If you are age 81 or older, your body mass index should be between 23-30. Your Body mass index is 24.46 kg/m. If this is out of the aforementioned range listed, please consider follow up with your Primary Care Provider.  If you are age 54 or younger, your body mass index should be between 19-25. Your Body mass index is 24.46 kg/m. If this is out of the aformentioned range listed, please consider follow up with your Primary Care Provider.   ________________________________________________________  The [place] [facility] providers would like to encourage you to use [facility] to communicate with providers for non-urgent requests or questions.  Due to long hold times on the telephone, sending your provider a message by [facility] may be a faster and more efficient way to get a response.  Please allow 48 business hours for a response.  Please remember that this is for non-urgent requests.  _______________________________________________________

## 2023-02-11 ENCOUNTER — Ambulatory Visit: Payer: Medicare Other | Admitting: Cardiology

## 2023-02-26 NOTE — Progress Notes (Unsigned)
  Electrophysiology Office Follow up Visit Note:    Date:  02/27/2023   ID:  Brittney Fox, DOB 05/27/1940, MRN [id number]  PCP:  Alysia Penna, MD  [facility] Cardiologist:  Tonny Bollman, MD  [facility] Electrophysiologist:  Lanier Prude, MD    Interval History:     Brittney Fox is a 82 y.o. female who presents for a follow up visit.   The patient had an A-fib ablation April 24, 2021 during which the veins and posterior wall were ablated.  She also had a PERI mitral atrial flutter that was targeted.  Her left atrium is heavily scarred and complex fractionated atrial electrograms were also targeted with catheter ablation.  I last saw her in clinic April 27, 2022.  She was in sinus rhythm at the time of my last appointment.  She saw Brittney Fox in clinic Aug 06, 2022.  At that appointment she was doing well and was more active.   Discussed the use of AI scribe software for clinical note transcription with the patient, who gave verbal consent to proceed.  Brittney Fox is doing well today.  No complaints.  Reports excellent control of her arrhythmia.  She continues to take Eliquis.  She remains very active.  She is still working as a Veterinary surgeon.        Past medical, surgical, social and family history were reviewed.  ROS:   Please see the history of present illness.    All other systems reviewed and are negative.  EKGs/Labs/Other Studies Reviewed:    The following studies were reviewed today:     EKG Interpretation Date/Time:  Wednesday February 27 2023 09:04:27 EST Ventricular Rate:  63 PR Interval:  186 QRS Duration:  106 QT Interval:  476 QTC Calculation: 487 R Axis:   -46  Text Interpretation: Normal sinus rhythm Incomplete right bundle branch block Left anterior fascicular block Confirmed by Steffanie Dunn [phone]) on 02/27/2023 9:24:53 AM    Physical Exam:    VS:  BP 120/70 (BP Location: Left Arm, Patient Position: Sitting, Cuff  Size: Normal)   Pulse 63   Ht 5\' 5"  (1.651 m)   Wt 148 lb 9.6 oz (67.4 kg)   LMP 04/02/1988   BMI 24.73 kg/m     Wt Readings from Last 3 Encounters:  02/27/23 148 lb 9.6 oz (67.4 kg)  02/06/23 147 lb (66.7 kg)  12/03/22 140 lb (63.5 kg)    General: Appears younger than stated age, no distress Cardiac: Regular rate and rhythm, no murmurs Respiratory: No increased work of breathing, lungs clear      ASSESSMENT:    1. Persistent atrial fibrillation (HCC)   2. Chronic diastolic congestive heart failure (HCC)   3. Atypical atrial flutter (HCC)    PLAN:    In order of problems listed above:  Assessment and Plan        #Persistent atrial fibrillation In sinus rhythm today Continue Eliquis Continue metoprolol succinate  #Chronic diastolic heart failure NYHA class II.  Warm dry exam.  Rhythm control indicated as above  Follow-up 1 year with me.           Signed, Steffanie Dunn, MD, [facility], [facility] 02/27/2023 9:25 AM    Electrophysiology [facility]

## 2023-02-27 ENCOUNTER — Encounter: Payer: Self-pay | Admitting: Cardiology

## 2023-02-27 ENCOUNTER — Ambulatory Visit: Payer: Medicare Other | Attending: Cardiology | Admitting: Cardiology

## 2023-02-27 VITALS — BP 120/70 | HR 63 | Ht 65.0 in | Wt 148.6 lb

## 2023-02-27 DIAGNOSIS — I484 Atypical atrial flutter: Secondary | ICD-10-CM | POA: Insufficient documentation

## 2023-02-27 DIAGNOSIS — I5032 Chronic diastolic (congestive) heart failure: Secondary | ICD-10-CM | POA: Insufficient documentation

## 2023-02-27 DIAGNOSIS — I4819 Other persistent atrial fibrillation: Secondary | ICD-10-CM | POA: Insufficient documentation

## 2023-02-27 NOTE — Patient Instructions (Signed)
Medication Instructions:  Your physician recommends that you continue on your current medications as directed. Please refer to the Current Medication list given to you today.  *If you need a refill on your cardiac medications before your next appointment, please call your pharmacy*  Follow-Up: At [facility], you and your health needs are our priority.  As part of our continuing mission to provide you with exceptional heart care, we have created designated Provider Care Teams.  These Care Teams include your primary Cardiologist (physician) and Advanced Practice Providers (APPs -  Physician Assistants and Nurse Practitioners) who all work together to provide you with the care you need, when you need it.   Your next appointment:   1 year  Provider:   You may see Lanier Prude, MD or one of the following Advanced Practice Providers on your designated Care Team:   Francis Dowse, [place] [address] [place], [place] Sherie Don, NP Canary Brim, NP

## 2023-03-19 ENCOUNTER — Encounter (HOSPITAL_BASED_OUTPATIENT_CLINIC_OR_DEPARTMENT_OTHER): Payer: Self-pay | Admitting: Obstetrics & Gynecology

## 2023-03-19 ENCOUNTER — Ambulatory Visit (HOSPITAL_BASED_OUTPATIENT_CLINIC_OR_DEPARTMENT_OTHER): Payer: Medicare Other | Admitting: Obstetrics & Gynecology

## 2023-03-19 VITALS — BP 133/69 | HR 89 | Ht 63.0 in | Wt 146.0 lb

## 2023-03-19 DIAGNOSIS — N3941 Urge incontinence: Secondary | ICD-10-CM

## 2023-03-19 DIAGNOSIS — I480 Paroxysmal atrial fibrillation: Secondary | ICD-10-CM

## 2023-03-19 DIAGNOSIS — N390 Urinary tract infection, site not specified: Secondary | ICD-10-CM

## 2023-03-19 DIAGNOSIS — N3020 Other chronic cystitis without hematuria: Secondary | ICD-10-CM | POA: Diagnosis not present

## 2023-03-19 DIAGNOSIS — Z01419 Encounter for gynecological examination (general) (routine) without abnormal findings: Secondary | ICD-10-CM | POA: Diagnosis not present

## 2023-03-19 DIAGNOSIS — R8271 Bacteriuria: Secondary | ICD-10-CM | POA: Diagnosis not present

## 2023-03-19 NOTE — Progress Notes (Signed)
82 y.o. G61P2002 Widowed White or Caucasian female here for breast and pelvic exam.  Denies vaginal bleeding.  Has hx of recurrent UTIs and has seen Dr. Sherron Monday.  Has bactrim Rx for when needed.    Denies vaginal bleeding.  Has some interest in restarting HRT.  I am concerned about stroke risk for her.    Patient's last menstrual period was 04/02/1988.          Sexually active: No.  H/O STD:  no  Health Maintenance: PCP:  Dr. Sharon Mt.  Last wellness appt was 12/2022.  Did blood work at that appt:  yes Vaccines are up to date:  yes Colonoscopy:  05/19/2019 MMG: 09/03/2022 Negative BMD:  06/18/2017 Normal Last pap smear:  2017    reports that she quit smoking about 61 years ago. Her smoking use included cigarettes. She has never used smokeless tobacco. She reports that she does not drink alcohol and does not use drugs.  Past Medical History:  Diagnosis Date   Anal fissure    as a child   Blood transfusion without reported diagnosis    Diabetes mellitus    managed by diet and exercise   Diverticulosis of colon (without mention of hemorrhage)    Fatty liver    GERD (gastroesophageal reflux disease)    Heart murmur    Hyperlipidemia    Irritable bowel syndrome    Lymphocytic colitis    chronic   Neuromuscular disorder (HCC)    neuropathy   Osteoarthritis    Osteopenia 09/2007   Other mucopurulent conjunctivitis    Premature ventricular contractions    Raynaud's syndrome    Rosacea    opitcal   Spinal stenosis     Past Surgical History:  Procedure Laterality Date   ATRIAL FIBRILLATION ABLATION N/A 04/24/2021   Procedure: ATRIAL FIBRILLATION ABLATION;  Surgeon: Lanier Prude, MD;  Location: [facility];  Service: Cardiovascular;  Laterality: N/A;   CATARACT EXTRACTION Bilateral    CHOLECYSTECTOMY     COLONOSCOPY     dental implants     DILATION AND CURETTAGE OF UTERUS  1990   FOOT SURGERY     HIP SURGERY Bilateral 1973   Jaw Implants  2007   2018.2019    KNEE ARTHROSCOPY Right    lens implant     ovarian wedge resection  1964   REPLACEMENT TOTAL KNEE Bilateral 2005, 2007   TOTAL ABDOMINAL HYSTERECTOMY W/ BILATERAL SALPINGOOPHORECTOMY  1990   Excessive Bleeding    TOTAL KNEE ARTHROPLASTY Left 08/2003   TOTAL KNEE ARTHROPLASTY Right 08/2005    Current Outpatient Medications  Medication Sig Dispense Refill   acetaminophen (TYLENOL) 500 MG tablet Take 500 mg by mouth every 6 (six) hours as needed for moderate pain.     Cholecalciferol (VITAMIN D) 50 MCG (2000 UT) tablet Take 2,000 Units by mouth daily.     dicyclomine (BENTYL) 20 MG tablet TAKE ONE TABLET EVERY 6 HOURS AS NEEDED 90 tablet 0   diphenoxylate-atropine (LOMOTIL) 2.5-0.025 MG tablet Take 1 tablet by mouth 2 (two) times daily as needed for diarrhea or loose stools. 180 tablet 3   ELIQUIS 5 MG TABS tablet TAKE ONE TABLET TWICE DAILY 60 tablet 5   estradiol (ESTRACE) 0.1 MG/GM vaginal cream as needed.     fluconazole (DIFLUCAN) 150 MG tablet Take by mouth.     fluconazole (DIFLUCAN) 150 MG tablet Take 1 tablet (150 mg total) by mouth daily. Repeat in 72 hours if needed 2  tablet 0   fluocinonide cream (LIDEX) 0.05 % Apply 1 application. topically as needed.     glucose blood (ONETOUCH VERIO) test strip      hyoscyamine (LEVBID) 0.375 MG 12 hr tablet Take 1 tablet (0.375 mg total) by mouth every morning. 90 tablet 3   influenza vaccine adjuvanted (FLUAD) 0.5 ML injection Inject into the muscle. 0.5 mL 0   JARDIANCE 10 MG TABS tablet Take 10 mg by mouth daily.     loperamide (IMODIUM A-D) 2 MG tablet Take 2-4 mg by mouth 4 (four) times daily as needed for diarrhea or loose stools.     meclizine (ANTIVERT) 25 MG tablet Take 25 mg by mouth as needed.     metoprolol succinate (TOPROL-XL) 25 MG 24 hr tablet TAKE ONE TABLET DAILY 90 tablet 1   naproxen sodium (ALEVE) 220 MG tablet Take 220-440 mg by mouth daily as needed (pain).     omeprazole (PRILOSEC) 40 MG capsule TAKE ONE CAPSULE EACH  DAY 90 capsule 3   Polyvinyl Alcohol-Povidone (REFRESH OP) Place 1 drop into both eyes daily as needed (dry eyes).     potassium chloride (KLOR-CON) 10 MEQ tablet Take 2 tablets (20 mEq total) by mouth 2 (two) times daily. 360 tablet 3   Probiotic Product (ALIGN PO) Take 1 capsule by mouth daily.     Psyllium (METAMUCIL PO) Take 1 Dose by mouth daily as needed (constipation). 1 dose = 1 tablespoon     spironolactone-hydrochlorothiazide (ALDACTAZIDE) 25-25 MG per tablet TAKE ONE TABLET EACH DAY 30 tablet 5   temazepam (RESTORIL) 15 MG capsule Take 15 mg by mouth as needed.     vitamin B-12 (CYANOCOBALAMIN) 1000 MCG tablet Take 1,000 mcg by mouth daily.     No current facility-administered medications for this visit.    Family History  Problem Relation Age of Onset   Lung cancer Sister    Osteoporosis Mother    Heart disease Father    Diabetes Paternal Uncle    Ovarian cancer Maternal Grandmother    Breast cancer Sister 49   Colon cancer Neg Hx    Stomach cancer Neg Hx    Esophageal cancer Neg Hx    Rectal cancer Neg Hx     Review of Systems  Constitutional: Negative.   Genitourinary: Negative.     Exam:   BP 133/69 (BP Location: Left Arm, Patient Position: Sitting, Cuff Size: Normal)   Pulse 89   Ht 5\' 3"  (1.6 m)   Wt 146 lb (66.2 kg)   LMP 04/02/1988   BMI 25.86 kg/m   Height: 5\' 3"  (160 cm)  General appearance: alert, cooperative and appears stated age Breasts: normal appearance, no masses or tenderness Abdomen: soft, non-tender; bowel sounds normal; no masses,  no organomegaly Lymph nodes: Cervical, supraclavicular, and axillary nodes normal.  No abnormal inguinal nodes palpated Neurologic: Grossly normal  Pelvic: External genitalia:  no lesions              Urethra:  normal appearing urethra with no masses, tenderness or lesions              Bartholins and Skenes: normal                 Vagina: normal appearing vagina with atrophic changes and no discharge, no  lesions              Cervix: absent              Pap  taken: No. Bimanual Exam:  Uterus:  uterus absent              Adnexa: no mass, fullness, tenderness               Rectovaginal: Confirms               Anus:  normal sphincter tone, no lesions  Chaperone, Ina Homes, CMA, was present for exam.  Assessment/Plan: 1. Encntr for gyn exam (general) (routine) w/o abn findings (Primary) - Pap smear not indicated - Mammogram 05/2022 - Colonoscopy 05/2019 - Bone mineral density 06/18/17.  Recommended repeating this in 2025.  She is going to see if can have this done at [facility].  If not, will let me know so I can send in the order for this.  - lab work done with PCP, Dr. Sharon Mt - vaccines reviewed/updated  2. Urge incontinence  3. Recurrent UTI - has rx for bactrim DS but has not used this past year  4. Paroxysmal atrial fibrillation (HCC) - on eliquis

## 2023-04-11 DIAGNOSIS — E11649 Type 2 diabetes mellitus with hypoglycemia without coma: Secondary | ICD-10-CM | POA: Diagnosis not present

## 2023-04-11 DIAGNOSIS — Z780 Asymptomatic menopausal state: Secondary | ICD-10-CM | POA: Diagnosis not present

## 2023-04-11 DIAGNOSIS — E113291 Type 2 diabetes mellitus with mild nonproliferative diabetic retinopathy without macular edema, right eye: Secondary | ICD-10-CM | POA: Diagnosis not present

## 2023-04-11 DIAGNOSIS — E032 Hypothyroidism due to medicaments and other exogenous substances: Secondary | ICD-10-CM | POA: Diagnosis not present

## 2023-04-11 DIAGNOSIS — E559 Vitamin D deficiency, unspecified: Secondary | ICD-10-CM | POA: Diagnosis not present

## 2023-04-11 DIAGNOSIS — R7309 Other abnormal glucose: Secondary | ICD-10-CM | POA: Diagnosis not present

## 2023-04-11 DIAGNOSIS — M8580 Other specified disorders of bone density and structure, unspecified site: Secondary | ICD-10-CM | POA: Diagnosis not present

## 2023-04-11 NOTE — Progress Notes (Signed)
 Subjective:-Copied and modified from previous note  Brittney Fox is a 83 y.o.  female who was seen today for t2dm. Her husband passed away in 01/25/2021from COVID. A1c today is 6.4%. She was diagnosed with afib in 2023. She was on amiodarone  over the summer and had some abnrml TFTs. She was not started on any medications. Stopped amiodarone  in Sept (ish)   She had a bout of cellulitis in the Fall (unclear cause).     She continues working as a veterinary surgeon. She is exercising twice weekly with trainer. She says she continues to have a low appetite and weight has dropped.   No hypoglycemia. Good hypo awareness. She is taking jardiance 10 mg daily. Had 1 yeast infection following a pelvic exam.   DM maintenance UTD with PCP Eye exam: yes UTD no DR  COVID Vaccine: yes  Echo 2023 which was WNL Feet: swelling improving with jardiance   In regards to her vitamin deficiency, she is taking  2000 IU vit d but not taking any vit b 12.     PCP Larnell Glendia CROME, MD PHONE: [phone] FAX: [phone]     Medical history: Past Medical History:  Diagnosis Date  . Diabetes mellitus type 2, uncomplicated (CMS/HHS-HCC)   . Edema     Past surgical history: Past Surgical History:  Procedure Laterality Date  . ABDOMINAL HYSTERECTOMY    . cataract surgery Bilateral   . CHOLECYSTECTOMY  age 5  . foot surgery Left   . HYSTERECTOMY    . JOINT REPLACEMENT    . REPLACEMENT TOTAL KNEE BILATERAL      Family History: Family History  Problem Relation Name Age of Onset  . High blood pressure (Hypertension) Mother    . Hyperlipidemia (Elevated cholesterol) Mother    . Osteoarthritis Mother    . Reflux disease Mother    . Stroke Mother    . Polycythemia Father    . Lung cancer Father    . Heart disease Father    . Breast cancer Sister    . Diabetes Paternal Grandfather    . Polycythemia Son      Medications:  Current Outpatient Medications:  .  apixaban  (ELIQUIS ) 5 mg tablet, Take 5 mg by  mouth 2 (two) times daily, Disp: , Rfl:  .  Bifidobacterium infantis (ALIGN) 4 mg capsule, Take 1 capsule by mouth once daily, Disp: , Rfl:  .  blood glucose diagnostic test strip, Use 3 (three) times daily Use as instructed., Disp: 300 each, Rfl: 3 .  carboxymethylcellulose (REFRESH TEARS) 0.5 % ophthalmic solution, Place 1 drop into both eyes as needed, Disp: , Rfl:  .  chlorhexidine (PERIDEX) 0.12 % solution, Use as directed 5 mLs in the mouth or throat as directed. As Needed, Disp: , Rfl:  .  cholecalciferol (VITAMIN D3) 2,000 unit tablet, Take 2,000 Units by mouth once daily, Disp: , Rfl:  .  cyanocobalamin  (VITAMIN B12) 1000 MCG tablet, Take 1,000 mcg by mouth once daily, Disp: , Rfl:  .  dicyclomine  (BENTYL ) 20 mg tablet, TAKE ONE TABLET EVERY 6 HOURS AS NEEDED, Disp: , Rfl:  .  diphenoxylate -atropine  (LOMOTIL ) 2.5-0.025 mg tablet, TAKE ONE (1) TABLET FOUR TIMES EACH DAY AS NEEDED FOR DIARRHEA, Disp: , Rfl:  .  empagliflozin (JARDIANCE) 10 mg tablet, Take 1 tablet (10 mg total) by mouth once daily, Disp: 30 tablet, Rfl: 11 .  erythromycin (ROMYCIN) ophthalmic ointment, as needed  , Disp: , Rfl:  .  estradioL  (  ESTRACE ) 0.01 % (0.1 mg/gram) vaginal cream, twice a week, Disp: , Rfl:  .  fluconazole  (DIFLUCAN ) 150 MG tablet, 150 mg as needed, Disp: , Rfl:  .  fluocinonide (LIDEX) 0.05 % cream, Apply 1 Application topically as needed, Disp: , Rfl:  .  hyoscyamine  (LEVBID ) 0.375 mg ER tablet, Take 0.375 mg by mouth every morning, Disp: , Rfl:  .  lancets (ONETOUCH DELICA LANCETS) 33 gauge Misc, Use 1 each 3 (three) times daily Use as directed for glucose monitoring   Dx code: E11.9, Disp: 400 each, Rfl: 3 .  meclizine (ANTIVERT) 12.5 mg tablet, Take 12.5 mg by mouth 3 (three) times daily as needed, Disp: , Rfl:  .  metoprolol  succinate (TOPROL -XL) 25 MG XL tablet, Take 1 tablet by mouth once daily, Disp: , Rfl:  .  omeprazole  (PRILOSEC) 40 MG DR capsule, , Disp: , Rfl:  .  ONETOUCH VERIO FLEX  METER Misc, 1 each as directed, Disp: 1 each, Rfl: 1 .  potassium chloride  (KLOR-CON ) 10 MEQ ER tablet, Take 10 mEq by mouth 2 (two) times daily 4 tablets, Disp: , Rfl:  .  pyridoxine, vitamin B6, (B-6) 100 MG tablet, Take 100 mg by mouth once daily, Disp: , Rfl:  .  spironolactone -hydrochlorothiazide (ALDACTAZIDE) 25-25 mg tablet, Take 1 tablet by mouth daily. , Disp: , Rfl:  .  temazepam (RESTORIL) 15 mg capsule, Take 15 mg by mouth at bedtime as needed, Disp: , Rfl:  .  estradiol  (ESTRACE ) 0.5 MG tablet, Take 0.5 mg by mouth once daily Taking 1/2 tablet (Patient not taking: Reported on 09/13/2022), Disp: , Rfl:  .  METOPROLOL  SUCCINATE ORAL, Take by mouth (Patient not taking: Reported on 04/11/2023), Disp: , Rfl:  .  rosuvastatin (CRESTOR) 5 MG tablet, Take 5 mg by mouth once daily (Patient not taking: Reported on 04/11/2023), Disp: , Rfl:   Allergies: Allergies  Allergen Reactions  . Morphine Swelling and Hives  . Codeine Headache  . Metformin Hcl Other (See Comments)  . Sitagliptin Other (See Comments)    Physical Exam:  Gen: NAD CV:RRR, slight systolic murmur Feet: 2+ DP pulses, no edema  Thyroid : no nodules   Assessment and plan:   1. T2D with hypoglycemia  - A1c 5.9% - on jardiance 10 mg. Discussed reducing to 5 mg given excellent A1c but still getting the diuretic effect given her issues with edema   - DM maintenance: UTD with pcp. Cannot tolerate statins.   2. Hypothyroidism 2.2 amiodarone   - TFTs normal when checked last off amiodarone  - update labs again today   3. Osteopenia - repeat DXA summer 2025  - on vit d 2000 IU daily   I spent a total of 30 minutes in both face-to-face and non-face-to-face activities, excluding procedures performed, for this visit on the date of this encounter.   Attestation Statement:   I personally performed the service, non-incident to. (WP)   JENNA NAT FOUNDS, PA Electronically signed by Andriette Founds, PA-C.    04/11/2023

## 2023-05-23 ENCOUNTER — Other Ambulatory Visit: Payer: Self-pay | Admitting: Cardiovascular Disease

## 2023-05-23 DIAGNOSIS — I480 Paroxysmal atrial fibrillation: Secondary | ICD-10-CM

## 2023-05-23 NOTE — Telephone Encounter (Signed)
Prescription refill request for Eliquis received. Indication: AF Last office visit: 02/27/23  Jeanie Cooks MD Scr: 0.75 on 08/06/22  Epic Age: 83 Weight: 67.4kg

## 2023-06-24 DIAGNOSIS — I5030 Unspecified diastolic (congestive) heart failure: Secondary | ICD-10-CM | POA: Diagnosis not present

## 2023-06-24 DIAGNOSIS — M8580 Other specified disorders of bone density and structure, unspecified site: Secondary | ICD-10-CM | POA: Diagnosis not present

## 2023-06-24 DIAGNOSIS — I10 Essential (primary) hypertension: Secondary | ICD-10-CM | POA: Diagnosis not present

## 2023-06-24 DIAGNOSIS — I4891 Unspecified atrial fibrillation: Secondary | ICD-10-CM | POA: Diagnosis not present

## 2023-06-24 DIAGNOSIS — K589 Irritable bowel syndrome without diarrhea: Secondary | ICD-10-CM | POA: Diagnosis not present

## 2023-06-24 DIAGNOSIS — I110 Hypertensive heart disease with heart failure: Secondary | ICD-10-CM | POA: Diagnosis not present

## 2023-06-24 DIAGNOSIS — I7300 Raynaud's syndrome without gangrene: Secondary | ICD-10-CM | POA: Diagnosis not present

## 2023-06-24 DIAGNOSIS — K2100 Gastro-esophageal reflux disease with esophagitis, without bleeding: Secondary | ICD-10-CM | POA: Diagnosis not present

## 2023-06-24 DIAGNOSIS — E1169 Type 2 diabetes mellitus with other specified complication: Secondary | ICD-10-CM | POA: Diagnosis not present

## 2023-06-24 DIAGNOSIS — G4700 Insomnia, unspecified: Secondary | ICD-10-CM | POA: Diagnosis not present

## 2023-06-24 DIAGNOSIS — R051 Acute cough: Secondary | ICD-10-CM | POA: Diagnosis not present

## 2023-06-24 DIAGNOSIS — E039 Hypothyroidism, unspecified: Secondary | ICD-10-CM | POA: Diagnosis not present

## 2023-06-24 DIAGNOSIS — D6869 Other thrombophilia: Secondary | ICD-10-CM | POA: Diagnosis not present

## 2023-08-03 ENCOUNTER — Other Ambulatory Visit: Payer: Self-pay | Admitting: Cardiovascular Disease

## 2023-08-21 ENCOUNTER — Other Ambulatory Visit: Payer: Self-pay | Admitting: Internal Medicine

## 2023-08-27 ENCOUNTER — Other Ambulatory Visit: Payer: Self-pay | Admitting: Internal Medicine

## 2023-08-27 ENCOUNTER — Other Ambulatory Visit: Payer: Self-pay | Admitting: Cardiovascular Disease

## 2023-09-02 NOTE — Progress Notes (Signed)
 Cardiology Office Note    Patient Name: Brittney Fox Date of Encounter: 09/03/2023  Primary Care Provider:  Barnetta Liberty, MD Primary Cardiologist:  Arnoldo Lapping, MD Primary Electrophysiologist: Boyce Byes, MD   Past Medical History    Past Medical History:  Diagnosis Date   Anal fissure    as a child   Blood transfusion without reported diagnosis    Diabetes mellitus    managed by diet and exercise   Diverticulosis of colon (without mention of hemorrhage)    Fatty liver    GERD (gastroesophageal reflux disease)    Heart murmur    Hyperlipidemia    Irritable bowel syndrome    Lymphocytic colitis    chronic   Neuromuscular disorder (HCC)    neuropathy   Osteoarthritis    Osteopenia 09/2007   Other mucopurulent conjunctivitis    Premature ventricular contractions    Raynaud's syndrome    Rosacea    opitcal   Spinal stenosis     History of Present Illness  Brittney Fox is a 83 y.o. female with a PMH of persistent AF (on Eliquis ) s/p AF ablation 04/2021, HFpEF, HLD, DM type II, pulmonary HTN, Raynaud's syndrome, GERD who presents today for annual follow-up.  Brittney Fox was seen initially by Dr. Arlester Ladd in 2011 for complaint of chest tightness and arm pain.  She completed a Lexiscan  Myoview  that showed no evidence of ischemia.  She was seen on 04/2014 with complaint of shortness of breath and underwent exercise echo that was unremarkable.  She was noted to have chronic leg edema and advised to elevate extremities when dependent and use compression stockings.  She was referred to vascular surgery for evaluation.  She contacted our office on 11/02/2020 with complaint of Apple Watch alarming for atrial fibrillation.  She was evaluated by Dr. Abel Hoe for emergent office visit and was found to have AF/flutter by EKG.  She was started on Eliquis  and wore a 3-day event monitor with referral to Dr. Marven Slimmer.  She was seen by Dr. Marven Slimmer on 11/29/2020 and was started  on flecainide  due to intolerance to amiodarone .  She was on vacation in [place]  and unfortunately had a recurrence of AF.  She was evaluated by Dr. Marven Slimmer and elected to undergo AF ablation which was completed on 04/2021.  She was found to have a significant amount of scar tissue during ablation and was advised to possibly need Tikosyn in the future.  He was last seen by Dr. Marven Slimmer on 02/27/2023 and reported doing well with good control of her arrhythmia. She was in sinus rhythm and advised to continue metoprolol  and Eliquis .  She was last seen by Dr. Arlester Ladd on 08/06/2022 and was maintaining sinus rhythm and was asymptomatic.  She had no changes made to her therapy during that visit.  Brittney Fox presents today for annual follow-up.  She reports no new cardiac concerns since her previous follow-up. She experiences dry mouth, possibly related to Lomotil , which she takes as needed. She experiences cramps in her ankles, particularly the right one, and manages them with compression hose and potassium supplements (10 mg twice daily). Her last potassium level was 4.4 in September 2024. She drinks about 32 ounces of water daily and is on Jardiance and spironolactone, which affect her fluid balance. She has a history of low potassium, requiring IV supplementation in the past. She experiences palpitations, particularly at night, and sometimes chest pain, which she attributes to indigestion. She takes omeprazole  in the morning  for GERD. She walks 10,000 steps daily and works out with a trainer twice a week, without discomfort during exercise. She sometimes feels her heart rate increase at night. She uses an Apple Watch to monitor her heart rate, which has not alerted her to any recent atrial fibrillation. No significant bruising or bleeding on Eliquis . She has a family history of heart disease, with her father-in-law being a former head of cardiology at [facility] [employer] [facility]. She has a history of Raynaud's phenomenon, with worsening  symptoms this past winter, managed with warm clothing and hand warmers. She does not consume alcohol  and limits caffeine intake. She has a history of diabetes, well-managed with an A1c of 6.0, and takes Jardiance for heart and kidney health. Patient denies chest pain, palpitations, dyspnea, PND, orthopnea, nausea, vomiting, dizziness, syncope, edema, weight gain, or early satiety.  Discussed the use of AI scribe software for clinical note transcription with the patient, who gave verbal consent to proceed.  History of Present Illness   Review of Systems  Please see the history of present illness.    All other systems reviewed and are otherwise negative except as noted above.  Physical Exam     Wt Readings from Last 3 Encounters:  09/03/23 144 lb 12.8 oz (65.7 kg)  03/19/23 146 lb (66.2 kg)  02/27/23 148 lb 9.6 oz (67.4 kg)   VS: Vitals:   09/03/23 1456  BP: (!) 116/56  Pulse: 65  SpO2: 100%  ,Body mass index is 25.65 kg/m. GEN: Well nourished, well developed in no acute distress Neck: No JVD; No carotid bruits Pulmonary: Clear to auscultation without rales, wheezing or rhonchi  Cardiovascular: Normal rate. Regular rhythm. Normal S1. Normal S2.   Murmurs: There is no murmur.  ABDOMEN: Soft, non-tender, non-distended EXTREMITIES:  No edema; No deformity   EKG/LABS/ Recent Cardiac Studies   ECG personally reviewed by me today -none completed today  Risk Assessment/Calculations:    CHA2DS2-VASc Score = 7   This indicates a 11.2% annual risk of stroke. The patient's score is based upon: CHF History: 1 HTN History: 1 Diabetes History: 1 Stroke History: 0 Vascular Disease History: 1 Age Score: 2 Gender Score: 1     STOP-Bang Score:  1      Lab Results  Component Value Date   WBC 5.6 09/01/2021   HGB 16.1 (H) 09/01/2021   HCT 48.6 (H) 09/01/2021   MCV 83 09/01/2021   PLT 256 09/01/2021   Lab Results  Component Value Date   CREATININE 0.75 08/06/2022   BUN 13  08/06/2022   NA 135 08/06/2022   K 3.5 08/06/2022   CL 92 (L) 08/06/2022   CO2 25 08/06/2022   Lab Results  Component Value Date   CHOL 144 09/13/2015   HDL 59 09/13/2015   LDLCALC 68 09/13/2015   LDLDIRECT 150.2 11/28/2010   TRIG 87 09/13/2015   CHOLHDL 2.4 09/13/2015    Lab Results  Component Value Date   HGBA1C 6.1 (A) 09/30/2013   Assessment & Plan    Assessment & Plan  1.  HFpEF: - Patient is euvolemic on examination with no complaints of shortness of breath. -Chronic leg swelling managed with compression hose. Ankle cramps possibly due to potassium levels. - Continue compression hose. -Continue spironolactone/HCTZ 25/25 daily -May discontinue HCTZ and continue spironolactone alone if potassium is low - Check BMET today  2.  Persistent AF: -Atrial fibrillation post-ablation, controlled with metoprolol  and Eliquis . No recent AFib episodes. Occasional nocturnal palpitations. Discussed Apple  Watch for monitoring. -Check CBC and BMET today - Continue Toprol -XL 25 mg and Eliquis  Eliquis  5 mg twice daily - Check kidney function and blood counts. - Use Apple Watch for heart rate and AFib monitoring.  3.  Essential hypertension: - Blood pressure today was stable at 116/56 - Continue Toprol -XL 25 mg and spironolactone-HCTZ 25/25 mg daily  4.  Gastroesophageal reflux disease (GERD): Intermittent chest pain likely due to GERD. Discussed switching to Protonix  for better management. - Switch to Protonix  40 mg. - Consider stress test if symptoms persist.  Disposition: Follow-up with Arnoldo Lapping, MD or APP in 12 months    Signed, Francene Ing, Retha Cast, NP 09/03/2023, 5:35 PM [facility]

## 2023-09-03 ENCOUNTER — Encounter: Payer: Self-pay | Admitting: Nurse Practitioner

## 2023-09-03 ENCOUNTER — Ambulatory Visit: Attending: Nurse Practitioner | Admitting: Nurse Practitioner

## 2023-09-03 ENCOUNTER — Other Ambulatory Visit: Payer: Self-pay | Admitting: Internal Medicine

## 2023-09-03 VITALS — BP 116/56 | HR 65 | Ht 63.0 in | Wt 144.8 lb

## 2023-09-03 DIAGNOSIS — I5032 Chronic diastolic (congestive) heart failure: Secondary | ICD-10-CM | POA: Diagnosis not present

## 2023-09-03 DIAGNOSIS — I4819 Other persistent atrial fibrillation: Secondary | ICD-10-CM

## 2023-09-03 DIAGNOSIS — E119 Type 2 diabetes mellitus without complications: Secondary | ICD-10-CM | POA: Diagnosis not present

## 2023-09-03 DIAGNOSIS — I10 Essential (primary) hypertension: Secondary | ICD-10-CM | POA: Diagnosis not present

## 2023-09-03 MED ORDER — PANTOPRAZOLE SODIUM 40 MG PO TBEC: 40.0000 mg | DELAYED_RELEASE_TABLET | Freq: Every day | ORAL | 11 refills | Status: AC

## 2023-09-03 NOTE — Patient Instructions (Signed)
 Medication Instructions:  STOP Omeprazole   START Protonix  40mg  Take 1 tablet once a day  *If you need a refill on your cardiac medications before your next appointment, please call your pharmacy*  Lab Work: TODAY-BMET & CBC If you have labs (blood work) drawn today and your tests are completely normal, you will receive your results only by: MyChart Message (if you have MyChart) OR A paper copy in the mail If you have any lab test that is abnormal or we need to change your treatment, we will call you to review the results.  Testing/Procedures: NONE ORDERED  Follow-Up: At [facility], you and your health needs are our priority.  As part of our continuing mission to provide you with exceptional heart care, our providers are all part of one team.  This team includes your primary Cardiologist (physician) and Advanced Practice Providers or APPs (Physician Assistants and Nurse Practitioners) who all work together to provide you with the care you need, when you need it.  Your next appointment:   12 month(s)  Provider:   Arnoldo Lapping, MD    We recommend signing up for the patient portal called "MyChart".  Sign up information is provided on this After Visit Summary.  MyChart is used to connect with patients for Virtual Visits (Telemedicine).  Patients are able to view lab/test results, encounter notes, upcoming appointments, etc.  Non-urgent messages can be sent to your provider as well.   To learn more about what you can do with MyChart, go to [email or web address].   Other Instructions

## 2023-09-04 ENCOUNTER — Ambulatory Visit: Payer: Self-pay | Admitting: Nurse Practitioner

## 2023-09-04 LAB — BASIC METABOLIC PANEL WITH GFR
BUN/Creatinine Ratio: 23 (ref 12–28)
BUN: 19 mg/dL (ref 8–27)
CO2: 21 mmol/L (ref 20–29)
Calcium: 9.8 mg/dL (ref 8.7–10.3)
Chloride: 91 mmol/L — ABNORMAL LOW (ref 96–106)
Creatinine, Ser: 0.83 mg/dL (ref 0.57–1.00)
Glucose: 86 mg/dL (ref 70–99)
Potassium: 3.9 mmol/L (ref 3.5–5.2)
Sodium: 133 mmol/L — ABNORMAL LOW (ref 134–144)
eGFR: 70 mL/min/{1.73_m2} (ref >59)

## 2023-09-04 LAB — CBC
Hematocrit: 50.7 % — ABNORMAL HIGH (ref 34.0–46.6)
Hemoglobin: 16.4 g/dL — ABNORMAL HIGH (ref 11.1–15.9)
MCH: 27.4 pg (ref 26.6–33.0)
MCHC: 32.3 g/dL (ref 31.5–35.7)
MCV: 85 fL (ref 79–97)
Platelets: 244 10*3/uL (ref 150–450)
RBC: 5.98 x10E6/uL — ABNORMAL HIGH (ref 3.77–5.28)
RDW: 13.4 % (ref 11.7–15.4)
WBC: 8.3 10*3/uL (ref 3.4–10.8)

## 2023-09-17 DIAGNOSIS — L821 Other seborrheic keratosis: Secondary | ICD-10-CM | POA: Diagnosis not present

## 2023-09-17 DIAGNOSIS — D692 Other nonthrombocytopenic purpura: Secondary | ICD-10-CM | POA: Diagnosis not present

## 2023-09-19 DIAGNOSIS — H00016 Hordeolum externum left eye, unspecified eyelid: Secondary | ICD-10-CM | POA: Diagnosis not present

## 2023-09-19 DIAGNOSIS — Z961 Presence of intraocular lens: Secondary | ICD-10-CM | POA: Diagnosis not present

## 2023-09-19 DIAGNOSIS — E113291 Type 2 diabetes mellitus with mild nonproliferative diabetic retinopathy without macular edema, right eye: Secondary | ICD-10-CM | POA: Diagnosis not present

## 2023-10-29 DIAGNOSIS — Z1231 Encounter for screening mammogram for malignant neoplasm of breast: Secondary | ICD-10-CM | POA: Diagnosis not present

## 2023-10-29 DIAGNOSIS — R92323 Mammographic fibroglandular density, bilateral breasts: Secondary | ICD-10-CM | POA: Diagnosis not present

## 2023-10-29 DIAGNOSIS — M8580 Other specified disorders of bone density and structure, unspecified site: Secondary | ICD-10-CM | POA: Diagnosis not present

## 2023-10-29 DIAGNOSIS — Z780 Asymptomatic menopausal state: Secondary | ICD-10-CM | POA: Diagnosis not present

## 2023-11-28 ENCOUNTER — Other Ambulatory Visit: Payer: Self-pay | Admitting: Cardiovascular Disease

## 2023-11-28 DIAGNOSIS — I480 Paroxysmal atrial fibrillation: Secondary | ICD-10-CM

## 2023-11-28 NOTE — Telephone Encounter (Signed)
 Prescription refill request for Eliquis  received. Indication: afib  Last office visit: Wyn 09/03/2023 Scr: 0.83, 09/03/2023 Age: 83 yo  Weight: 65.7 kg   Refill sent.

## 2023-12-10 DIAGNOSIS — M549 Dorsalgia, unspecified: Secondary | ICD-10-CM | POA: Diagnosis not present

## 2023-12-10 DIAGNOSIS — N390 Urinary tract infection, site not specified: Secondary | ICD-10-CM | POA: Diagnosis not present

## 2023-12-12 NOTE — Telephone Encounter (Signed)
 Spoke with pt and confirmed she was okay with Friday 9/12 at 10:20 AM appt. Pt stated she was okay with appt and already confirmed on MyChart.

## 2023-12-12 NOTE — Telephone Encounter (Signed)
-----   Message from Ozell Fell sent at 12/10/2023  5:38 PM EDT ----- Please give her a call I didn't give her time or date just told her I'd add her this week. Thx! ----- Message ----- From: Gordon Ronal SQUIBB, RN Sent: 12/10/2023   5:32 PM EDT To: Ozell Fell, MD  She's been added. Is she aware of the appt or do I need to call her? ----- Message ----- From: Fell Ozell, MD Sent: 12/10/2023   5:25 PM EDT To: Ronal SQUIBB Gordon, RN; [facility] Scheduling  Can you add this patient on to my schedule Friday at 10:20am? Thank you - Garrel

## 2023-12-13 ENCOUNTER — Encounter: Payer: Self-pay | Admitting: Cardiovascular Disease

## 2023-12-13 ENCOUNTER — Ambulatory Visit: Attending: Cardiovascular Disease | Admitting: Cardiovascular Disease

## 2023-12-13 ENCOUNTER — Ambulatory Visit (INDEPENDENT_AMBULATORY_CARE_PROVIDER_SITE_OTHER)

## 2023-12-13 VITALS — BP 124/72 | HR 59 | Ht 65.0 in | Wt 144.8 lb

## 2023-12-13 DIAGNOSIS — I480 Paroxysmal atrial fibrillation: Secondary | ICD-10-CM | POA: Insufficient documentation

## 2023-12-13 DIAGNOSIS — R002 Palpitations: Secondary | ICD-10-CM

## 2023-12-13 DIAGNOSIS — I10 Essential (primary) hypertension: Secondary | ICD-10-CM | POA: Insufficient documentation

## 2023-12-13 DIAGNOSIS — I5032 Chronic diastolic (congestive) heart failure: Secondary | ICD-10-CM | POA: Insufficient documentation

## 2023-12-13 NOTE — Patient Instructions (Signed)
 Medication Instructions:  No medication changes were made at this visit. Continue current regimen.   *If you need a refill on your cardiac medications before your next appointment, please call your pharmacy*  Lab Work: None ordered today. If you have labs (blood work) drawn today and your tests are completely normal, you will receive your results only by: MyChart Message (if you have MyChart) OR A paper copy in the mail If you have any lab test that is abnormal or we need to change your treatment, we will call you to review the results.  Testing/Procedures: Your physician has requested that you wear a Zio heart monitor for 3 days. This will be mailed to your home with instructions on how to apply the monitor and how to return it when finished. Please allow 2 weeks after returning the heart monitor before our office calls you with the results.   Your physician has requested that you have an echocardiogram. Echocardiography is a painless test that uses sound waves to create images of your heart. It provides your doctor with information about the size and shape of your heart and how well your heart's chambers and valves are working. This procedure takes approximately one hour. There are no restrictions for this procedure. Please do NOT wear cologne, perfume, aftershave, or lotions (deodorant is allowed). Please arrive 15 minutes prior to your appointment time.  Please note: We ask at that you not bring children with you during ultrasound (echo/ vascular) testing. Due to room size and safety concerns, children are not allowed in the ultrasound rooms during exams. Our front office staff cannot provide observation of children in our lobby area while testing is being conducted. An adult accompanying a patient to their appointment will only be allowed in the ultrasound room at the discretion of the ultrasound technician under special circumstances. We apologize for any inconvenience.   Follow-Up: At  [facility], you and your health needs are our priority.  As part of our continuing mission to provide you with exceptional heart care, our providers are all part of one team.  This team includes your primary Cardiologist (physician) and Advanced Practice Providers or APPs (Physician Assistants and Nurse Practitioners) who all work together to provide you with the care you need, when you need it.  Your next appointment:   1 year(s)  Provider:   Ozell Fell, MD    We recommend signing up for the patient portal called MyChart.  Sign up information is provided on this After Visit Summary.  MyChart is used to connect with patients for Virtual Visits (Telemedicine).  Patients are able to view lab/test results, encounter notes, upcoming appointments, etc.  Non-urgent messages can be sent to your provider as well.   To learn more about what you can do with MyChart, go to [email or web address].   Other Instructions ZIO XT- Long Term Monitor Instructions  Your physician has requested you wear a ZIO patch monitor for 3 days.   This is a single patch monitor. [employer] supplies one patch monitor per enrollment. Additional  stickers are not available. Please do not apply patch if you will be having a Nuclear Stress Test,  Echocardiogram, Cardiac CT, MRI, or Chest Xray during the period you would be wearing the  monitor. The patch cannot be worn during these tests. You cannot remove and re-apply the  ZIO XT patch monitor.   Your ZIO patch monitor will be mailed 3 day USPS to your address on file. It may take 3-5  days  to receive your monitor after you have been enrolled.   Once you have received your monitor, please review the enclosed instructions. Your monitor  has already been registered assigning a specific monitor serial # to you.     Billing and Patient Assistance Program Information  We have supplied [employer] with any of your insurance information on file for billing purposes.   [employer] offers a sliding scale Patient Assistance Program for patients that do not have  insurance, or whose insurance does not completely cover the cost of the ZIO monitor.  You must apply for the Patient Assistance Program to qualify for this discounted rate.   To apply, please call [employer] at [phone], select option 4, select option 2, ask to apply for  Patient Assistance Program. Meredeth will ask your household income, and how many people  are in your household. They will quote your out-of-pocket cost based on that information.  [employer] will also be able to set up a 39-month, interest-free payment plan if needed.     Applying the monitor  Shave hair from upper left chest.  Hold abrader disc by orange tab. Rub abrader in 40 strokes over the upper left chest as  indicated in your monitor instructions.  Clean area with 4 enclosed alcohol pads. Let dry.  Apply patch as indicated in monitor instructions. Patch will be placed under collarbone on left  side of chest with arrow pointing upward.  Rub patch adhesive wings for 2 minutes. Remove white label marked 1. Remove the white  label marked 2. Rub patch adhesive wings for 2 additional minutes.  While looking in a mirror, press and release button in center of patch. A small green light will  flash 3-4 times. This will be your only indicator that the monitor has been turned on.  Do not shower for the first 24 hours. You may shower after the first 24 hours.  Press the button if you feel a symptom. You will hear a small click. Record Date, Time and  Symptom in the Patient Logbook.  When you are ready to remove the patch, follow instructions on the last 2 pages of Patient  Logbook. Stick patch monitor onto the last page of Patient Logbook.   Place Patient Logbook in the blue and white box. Use locking tab on box and tape box closed  securely. The blue and white box has prepaid postage on it. Please place it in the mailbox as  soon as  possible. Your physician should have your test results approximately 7 days after the  monitor has been mailed back to [facility].   Call [facility] Customer Care at [phone] if you have questions regarding  your ZIO XT patch monitor. Call them immediately if you see an orange light blinking on your  monitor.   If your monitor falls off in less than 4 days, contact our Monitor department at [phone].   If your monitor becomes loose or falls off after 4 days call [employer] at [phone] for  suggestions on securing your monitor.

## 2023-12-13 NOTE — Progress Notes (Unsigned)
 Enrolled for [employer] to mail a ZIO XT long term holter monitor to the patients address on file.

## 2023-12-13 NOTE — Progress Notes (Unsigned)
 Cardiology Office Note:    Date:  12/15/2023   ID:  Brittney Fox, DOB Sep 08, 1940, MRN [id number]  PCP:  Brittney Hamilton, MD   [facility] Providers Cardiologist:  Ozell Fell, MD Electrophysiologist:  OLE ONEIDA HOLTS, MD     Referring MD: Brittney Hamilton, MD   Chief Complaint  Patient presents with   Atrial Fibrillation    History of Present Illness:    Brittney Fox is a 83 y.o. female presenting for follow-up of atrial fibrillation.  Patient has a history of persistent atrial fibrillation and underwent A-fib ablation in 2023.  Comorbid problems include HFpEF, hyperlipidemia, type 2 diabetes, and pulmonary hypertension.  Patient is anticoagulated with apixaban .  Last echo from 2022 showed normal LVEF, normal RV function, moderate LA dilatation and moderate RA dilatation, trivial mitral regurgitation, and aortic sclerosis without significant stenosis.  Myocardial perfusion imaging in 2022 was normal with normal LVEF of 55%.  She is here alone today. At nighttime, she is experiencing chest heaviness and heart palpitations. In the morning, she feels like she wakes up with 'tremors.' She is still working as a Veterinary surgeon.  She has been under a lot of stress with family issues.  She exercises on a regular basis with a trainer and denies exertional chest pain or pressure.  She has no orthopnea or PND.  Patient has chronic leg edema and wears compression stockings.  She remains involved in the community and very active overall.  She has some chronic dyspnea but no new exertional symptoms and again she is involved in regular exercise.  Current Medications: Current Meds  Medication Sig   acetaminophen  (TYLENOL ) 500 MG tablet Take 500 mg by mouth every 6 (six) hours as needed for moderate pain.   Cholecalciferol (VITAMIN D ) 50 MCG (2000 UT) tablet Take 2,000 Units by mouth daily.   dicyclomine  (BENTYL ) 20 MG tablet TAKE ONE TABLET EVERY 6 HOURS AS NEEDED (Patient taking  differently: Take 20 mg by mouth as needed.)   diphenoxylate -atropine  (LOMOTIL ) 2.5-0.025 MG tablet TAKE ONE TABLET TWICE DAILY AS NEEDED FOR DIARRHEA OR LOOSE STOOLS   ELIQUIS  5 MG TABS tablet TAKE ONE TABLET TWICE DAILY   estradiol  (ESTRACE ) 0.1 MG/GM vaginal cream as needed.   fluconazole  (DIFLUCAN ) 150 MG tablet Take 1 tablet (150 mg total) by mouth daily. Repeat in 72 hours if needed   fluocinonide cream (LIDEX) 0.05 % Apply 1 application. topically as needed.   glucose blood (ONETOUCH VERIO) test strip    hyoscyamine  (LEVBID ) 0.375 MG 12 hr tablet Take 1 tablet (0.375 mg total) by mouth every morning.   influenza vaccine adjuvanted (FLUAD) 0.5 ML injection Inject into the muscle.   JARDIANCE 10 MG TABS tablet Take 10 mg by mouth daily.   loperamide (IMODIUM A-D) 2 MG tablet Take 2-4 mg by mouth 4 (four) times daily as needed for diarrhea or loose stools.   meclizine (ANTIVERT) 25 MG tablet Take 25 mg by mouth as needed.   metoprolol  succinate (TOPROL -XL) 25 MG 24 hr tablet TAKE ONE TABLET DAILY   naproxen sodium (ALEVE) 220 MG tablet Take 220-440 mg by mouth daily as needed (pain).   nitrofurantoin  (MACRODANTIN ) 100 MG capsule Take 100 mg by mouth 2 (two) times daily.   pantoprazole  (PROTONIX ) 40 MG tablet Take 1 tablet (40 mg total) by mouth daily.   Polyvinyl Alcohol -Povidone (REFRESH OP) Place 1 drop into both eyes daily as needed (dry eyes).   potassium chloride  (KLOR-CON ) 10 MEQ tablet TAKE TWO  TABLETS TWICE DAILY   Probiotic Product (ALIGN PO) Take 1 capsule by mouth daily.   spironolactone-hydrochlorothiazide (ALDACTAZIDE) 25-25 MG per tablet TAKE ONE TABLET EACH DAY   temazepam (RESTORIL) 15 MG capsule Take 15 mg by mouth as needed.     Allergies:   Morphine sulfate, Codeine phosphate, Metformin hcl, and Sitagliptin   ROS:   Please see the history of present illness.    All other systems reviewed and are negative.  EKGs/Labs/Other Studies Reviewed:    The following  studies were reviewed today: Cardiac Studies & Procedures   ______________________________________________________________________________________________   STRESS TESTS  MYOCARDIAL PERFUSION IMAGING 12/07/2020  Interpretation Summary   The study is normal. The study is low risk.   LV perfusion is normal. There is no evidence of ischemia. There is no evidence of infarction.   Left ventricular function is normal. Nuclear stress EF: 55 %. The left ventricular ejection fraction is normal (55-65%). End diastolic cavity size is normal.   Prior study available for comparison from 02/19/2005. No changes compared to prior study. No change from previous study 2006  Normal resting and stress perfusion. No ischemia or infarction EF 55% no change from study done 2006   ECHOCARDIOGRAM  ECHOCARDIOGRAM COMPLETE 02/20/2021  Narrative ECHOCARDIOGRAM REPORT    Patient Name:   Brittney Fox [facility] Date of Exam: 02/20/2021 Medical Rec #:  [id number]            Height:       65.0 in Accession #:    [id number]           Weight:       165.4 lb Date of Birth:  [birth year]            BSA:          1.825 m Patient Age:    80 years             BP:           126/74 mmHg Patient Gender: F                    HR:           64 bpm. Exam Location:  [address]  Procedure: 3D Echo, 2D Echo, Cardiac Doppler, Color Doppler and Strain Analysis  Indications:    I48.0 Paroxysmal Atrial Fibrillation.  History:        Patient has prior history of Echocardiogram examinations, most recent 02/18/2020. Risk Factors:Diabetes and Dyslipidemia. Murmur.  Sonographer:    Carl Coma RDCS Referring Phys: [id number] OLE DASEN LAMBERT  IMPRESSIONS   1. Left ventricular ejection fraction, by estimation, is 55 to 60%. The left ventricle has normal function. The left ventricle has no regional wall motion abnormalities. Left ventricular diastolic parameters were normal. The average left ventricular global  longitudinal strain is -25.6 %. The global longitudinal strain is normal. 2. Right ventricular systolic function is normal. The right ventricular size is normal. 3. Left atrial size was moderately dilated. 4. Likely PFO present . Evidence of atrial level shunting detected by color flow Doppler. 5. Right atrial size was moderately dilated. 6. The mitral valve is abnormal. Trivial mitral valve regurgitation. No evidence of mitral stenosis. 7. The aortic valve is tricuspid. There is mild calcification of the aortic valve. Aortic valve regurgitation is not visualized. Aortic valve sclerosis is present, with no evidence of aortic valve stenosis. 8. The inferior vena cava is normal in size with greater than 50% respiratory variability, suggesting right  atrial pressure of 3 mmHg.  FINDINGS Left Ventricle: Left ventricular ejection fraction, by estimation, is 55 to 60%. The left ventricle has normal function. The left ventricle has no regional wall motion abnormalities. The average left ventricular global longitudinal strain is -25.6 %. The global longitudinal strain is normal. The left ventricular internal cavity size was normal in size. There is no left ventricular hypertrophy. Left ventricular diastolic parameters were normal.  Right Ventricle: The right ventricular size is normal. No increase in right ventricular wall thickness. Right ventricular systolic function is normal.  Left Atrium: Left atrial size was moderately dilated.  Right Atrium: Right atrial size was moderately dilated.  Pericardium: There is no evidence of pericardial effusion.  Mitral Valve: The mitral valve is abnormal. There is mild thickening of the mitral valve leaflet(s). There is mild calcification of the mitral valve leaflet(s). Trivial mitral valve regurgitation. No evidence of mitral valve stenosis.  Tricuspid Valve: The tricuspid valve is normal in structure. Tricuspid valve regurgitation is mild . No evidence of  tricuspid stenosis.  Aortic Valve: The aortic valve is tricuspid. There is mild calcification of the aortic valve. Aortic valve regurgitation is not visualized. Aortic valve sclerosis is present, with no evidence of aortic valve stenosis.  Pulmonic Valve: The pulmonic valve was normal in structure. Pulmonic valve regurgitation is not visualized. No evidence of pulmonic stenosis.  Aorta: The aortic root is normal in size and structure.  Venous: The inferior vena cava is normal in size with greater than 50% respiratory variability, suggesting right atrial pressure of 3 mmHg.  IAS/Shunts: Evidence of atrial level shunting detected by color flow Doppler.   LEFT VENTRICLE PLAX 2D LVIDd:         4.70 cm   Diastology LVIDs:         2.80 cm   LV e' medial:    7.72 cm/s LV PW:         0.80 cm   LV E/e' medial:  10.1 LV IVS:        0.80 cm   LV e' lateral:   10.80 cm/s LVOT diam:     2.20 cm   LV E/e' lateral: 7.2 LV SV:         68 LV SV Index:   37        2D Longitudinal Strain LVOT Area:     3.80 cm  2D Strain GLS (A2C):   -25.7 % 2D Strain GLS (A3C):   -25.7 % 2D Strain GLS (A4C):   -25.5 % 2D Strain GLS Avg:     -25.6 %  3D Volume EF: 3D EF:        60 % LV EDV:       116 ml LV ESV:       46 ml LV SV:        70 ml  RIGHT VENTRICLE             IVC RV Basal diam:  4.00 cm     IVC diam: 1.40 cm RV S prime:     12.60 cm/s TAPSE (M-mode): 2.5 cm  LEFT ATRIUM             Index        RIGHT ATRIUM           Index LA diam:        4.90 cm 2.69 cm/m   RA Area:     17.70 cm LA Vol (A2C):   48.7 ml 26.69  ml/m  RA Volume:   51.10 ml  28.00 ml/m LA Vol (A4C):   34.8 ml 19.07 ml/m LA Biplane Vol: 43.1 ml 23.62 ml/m AORTIC VALVE LVOT Vmax:   86.20 cm/s LVOT Vmean:  55.750 cm/s LVOT VTI:    0.180 m  AORTA Ao Root diam: 3.20 cm Ao Asc diam:  3.60 cm  MITRAL VALVE               TRICUSPID VALVE MV Area (PHT): 3.60 cm    TR Peak grad:   40.2 mmHg MV Decel Time: 211 msec    TR Vmax:         317.00 cm/s MV E velocity: 77.60 cm/s MV A velocity: 52.00 cm/s  SHUNTS MV E/A ratio:  1.49        Systemic VTI:  0.18 m Systemic Diam: 2.20 cm  Maude Emmer MD Electronically signed by Maude Emmer MD Signature Date/Time: 02/20/2021/2:36:33 PM    Final    MONITORS  LONG TERM MONITOR (3-14 DAYS) 03/28/2022  Narrative Patch Wear Time:  6 days and 18 hours (2023-12-15T12:09:05-498 to 2023-12-22T06:33:26-0500)  Patient had a min HR of 44 bpm, max HR of 154 bpm, and avg HR of 67 bpm. Predominant underlying rhythm was Sinus Rhythm. 269 Supraventricular Tachycardia runs occurred, the run with the fastest interval lasting 11 beats with a max rate of 154 bpm, the longest lasting 11.5 secs with an avg rate of 109 bpm. Some episodes of Supraventricular Tachycardia may be possible Atrial Tachycardia with variable block. Isolated SVEs were occasional (2.0%, 11675), SVE Couplets were rare (<1.0%, 2076), and SVE Triplets were rare (<1.0%, 568). Isolated VEs were occasional (1.4%, 8427), VE Couplets were rare (<1.0%, 43), and VE Triplets were rare (<1.0%, 1). Ventricular Bigeminy and Trigeminy were present.  Summary: The basic rhythm is normal sinus with an average HR of 67 bpm. There are occasional supraventricular ectopics and short supraventricular runs up to 12 seconds. Occasional PVC's with a 1.4% burden. No atrial fibrillation or flutter.       ______________________________________________________________________________________________      EKG:   EKG Interpretation Date/Time:  Friday December 13 2023 10:09:05 EDT Ventricular Rate:  58 PR Interval:  158 QRS Duration:  100 QT Interval:  478 QTC Calculation: 469 R Axis:   -38  Text Interpretation: Sinus bradycardia with marked sinus arrhythmia Left axis deviation Pulmonary disease pattern Incomplete right bundle branch block Septal infarct , age undetermined When compared with ECG of 27-Feb-2023 09:04, Septal infarct is now  Present Confirmed by Wonda Sharper [phone]) on 12/13/2023 10:20:14 AM    Recent Labs: 09/03/2023: BUN 19; Creatinine, Ser 0.83; Hemoglobin 16.4; Platelets 244; Potassium 3.9; Sodium 133  Recent Lipid Panel    Component Value Date/Time   CHOL 144 09/13/2015 0734   TRIG 87 09/13/2015 0734   TRIG 88 02/06/2006 1028   HDL 59 09/13/2015 0734   CHOLHDL 2.4 09/13/2015 0734   VLDL 17 09/13/2015 0734   LDLCALC 68 09/13/2015 0734   LDLDIRECT 150.2 11/28/2010 1131     Risk Assessment/Calculations:    CHA2DS2-VASc Score = 7   This indicates a 11.2% annual risk of stroke. The patient's score is based upon: CHF History: 1 HTN History: 1 Diabetes History: 1 Stroke History: 0 Vascular Disease History: 1 Age Score: 2 Gender Score: 1      Physical Exam:    VS:  BP 124/72 (BP Location: Right Arm, Patient Position: Sitting, Cuff Size: Normal)   Pulse (!) 59  Ht 5' 5 (1.651 m)   Wt 144 lb 12.8 oz (65.7 kg)   LMP 04/02/1988   SpO2 97%   BMI 24.10 kg/m     Wt Readings from Last 3 Encounters:  12/13/23 144 lb 12.8 oz (65.7 kg)  09/03/23 144 lb 12.8 oz (65.7 kg)  03/19/23 146 lb (66.2 kg)     GEN:  Well nourished, well developed in no acute distress HEENT: Normal NECK: No JVD; No carotid bruits LYMPHATICS: No lymphadenopathy CARDIAC: RRR, soft ejection murmur at the right upper sternal border RESPIRATORY:  Clear to auscultation without rales, wheezing or rhonchi  ABDOMEN: Soft, non-tender, non-distended MUSCULOSKELETAL:  No edema; No deformity  SKIN: Warm and dry NEUROLOGIC:  Alert and oriented x 3 PSYCHIATRIC:  Normal affect   Assessment & Plan Paroxysmal atrial fibrillation (HCC) Appears to be maintaining sinus rhythm with some sinus arrhythmia noted on her EKG.  She will continue on metoprolol  succinate.  She will continue on apixaban  for anticoagulation.  With her heart palpitations, atypical chest pain, and occasional shortness of breath, will check a 3-day ZIO monitor  and 2D echocardiogram.  Her last studies from 2022 and 2023 were reviewed as outlined above.  I think these should be updated.  Otherwise we will continue her current medical program. Essential hypertension Blood pressure is well-controlled on metoprolol  succinate and Aldactazide.  Continue current management. Chronic diastolic congestive heart failure (HCC) Appears euvolemic on exam.  Exercising regularly.  The patient appears physically fit and has lost weight over the last few years.  Will continue her current medical management and update an echocardiogram to evaluate for any changes in LV systolic or diastolic function.  Will Palpitations Check ZIO monitor. Otherwise as above.      Medication Adjustments/Labs and Tests Ordered: Current medicines are reviewed at length with the patient today.  Concerns regarding medicines are outlined above.  Orders Placed This Encounter  Procedures   LONG TERM MONITOR (3-14 DAYS)   EKG 12-Lead   ECHOCARDIOGRAM COMPLETE   No orders of the defined types were placed in this encounter.   Patient Instructions  Medication Instructions:  No medication changes were made at this visit. Continue current regimen.   *If you need a refill on your cardiac medications before your next appointment, please call your pharmacy*  Lab Work: None ordered today. If you have labs (blood work) drawn today and your tests are completely normal, you will receive your results only by: MyChart Message (if you have MyChart) OR A paper copy in the mail If you have any lab test that is abnormal or we need to change your treatment, we will call you to review the results.  Testing/Procedures: Your physician has requested that you wear a Zio heart monitor for 3 days. This will be mailed to your home with instructions on how to apply the monitor and how to return it when finished. Please allow 2 weeks after returning the heart monitor before our office calls you with the results.    Your physician has requested that you have an echocardiogram. Echocardiography is a painless test that uses sound waves to create images of your heart. It provides your doctor with information about the size and shape of your heart and how well your heart's chambers and valves are working. This procedure takes approximately one hour. There are no restrictions for this procedure. Please do NOT wear cologne, perfume, aftershave, or lotions (deodorant is allowed). Please arrive 15 minutes prior to your appointment time.  Please note: We ask at that you not bring children with you during ultrasound (echo/ vascular) testing. Due to room size and safety concerns, children are not allowed in the ultrasound rooms during exams. Our front office staff cannot provide observation of children in our lobby area while testing is being conducted. An adult accompanying a patient to their appointment will only be allowed in the ultrasound room at the discretion of the ultrasound technician under special circumstances. We apologize for any inconvenience.   Follow-Up: At [facility], you and your health needs are our priority.  As part of our continuing mission to provide you with exceptional heart care, our providers are all part of one team.  This team includes your primary Cardiologist (physician) and Advanced Practice Providers or APPs (Physician Assistants and Nurse Practitioners) who all work together to provide you with the care you need, when you need it.  Your next appointment:   1 year(s)  Provider:   Ozell Fell, MD    We recommend signing up for the patient portal called MyChart.  Sign up information is provided on this After Visit Summary.  MyChart is used to connect with patients for Virtual Visits (Telemedicine).  Patients are able to view lab/test results, encounter notes, upcoming appointments, etc.  Non-urgent messages can be sent to your provider as well.   To learn more about what  you can do with MyChart, go to [email or web address].   Other Instructions ZIO XT- Long Term Monitor Instructions  Your physician has requested you wear a ZIO patch monitor for 3 days.   This is a single patch monitor. [employer] supplies one patch monitor per enrollment. Additional  stickers are not available. Please do not apply patch if you will be having a Nuclear Stress Test,  Echocardiogram, Cardiac CT, MRI, or Chest Xray during the period you would be wearing the  monitor. The patch cannot be worn during these tests. You cannot remove and re-apply the  ZIO XT patch monitor.   Your ZIO patch monitor will be mailed 3 day USPS to your address on file. It may take 3-5 days  to receive your monitor after you have been enrolled.   Once you have received your monitor, please review the enclosed instructions. Your monitor  has already been registered assigning a specific monitor serial # to you.     Billing and Patient Assistance Program Information  We have supplied [employer] with any of your insurance information on file for billing purposes.  [employer] offers a sliding scale Patient Assistance Program for patients that do not have  insurance, or whose insurance does not completely cover the cost of the ZIO monitor.  You must apply for the Patient Assistance Program to qualify for this discounted rate.   To apply, please call [employer] at [phone], select option 4, select option 2, ask to apply for  Patient Assistance Program. Meredeth will ask your household income, and how many people  are in your household. They will quote your out-of-pocket cost based on that information.  [employer] will also be able to set up a 28-month, interest-free payment plan if needed.     Applying the monitor  Shave hair from upper left chest.  Hold abrader disc by orange tab. Rub abrader in 40 strokes over the upper left chest as  indicated in your monitor instructions.  Clean area with 4 enclosed alcohol   pads. Let dry.  Apply patch as indicated in monitor instructions. Patch will be placed under collarbone  on left  side of chest with arrow pointing upward.  Rub patch adhesive wings for 2 minutes. Remove white label marked 1. Remove the white  label marked 2. Rub patch adhesive wings for 2 additional minutes.  While looking in a mirror, press and release button in center of patch. A small green light will  flash 3-4 times. This will be your only indicator that the monitor has been turned on.  Do not shower for the first 24 hours. You may shower after the first 24 hours.  Press the button if you feel a symptom. You will hear a small click. Record Date, Time and  Symptom in the Patient Logbook.  When you are ready to remove the patch, follow instructions on the last 2 pages of Patient  Logbook. Stick patch monitor onto the last page of Patient Logbook.   Place Patient Logbook in the blue and white box. Use locking tab on box and tape box closed  securely. The blue and white box has prepaid postage on it. Please place it in the mailbox as  soon as possible. Your physician should have your test results approximately 7 days after the  monitor has been mailed back to [facility].   Call [facility] Customer Care at [phone] if you have questions regarding  your ZIO XT patch monitor. Call them immediately if you see an orange light blinking on your  monitor.   If your monitor falls off in less than 4 days, contact our Monitor department at [phone].   If your monitor becomes loose or falls off after 4 days call [employer] at [phone] for  suggestions on securing your monitor.       Signed, Ozell Fell, MD  12/15/2023 6:30 PM    [facility]

## 2023-12-13 NOTE — Assessment & Plan Note (Signed)
 Appears to be maintaining sinus rhythm with some sinus arrhythmia noted on her EKG.  She will continue on metoprolol  succinate.  She will continue on apixaban  for anticoagulation.  With her heart palpitations, atypical chest pain, and occasional shortness of breath, will check a 3-day ZIO monitor and 2D echocardiogram.  Her last studies from 2022 and 2023 were reviewed as outlined above.  I think these should be updated.  Otherwise we will continue her current medical program.

## 2023-12-24 DIAGNOSIS — E1169 Type 2 diabetes mellitus with other specified complication: Secondary | ICD-10-CM | POA: Diagnosis not present

## 2023-12-24 DIAGNOSIS — E559 Vitamin D deficiency, unspecified: Secondary | ICD-10-CM | POA: Diagnosis not present

## 2023-12-24 DIAGNOSIS — E785 Hyperlipidemia, unspecified: Secondary | ICD-10-CM | POA: Diagnosis not present

## 2023-12-24 DIAGNOSIS — I10 Essential (primary) hypertension: Secondary | ICD-10-CM | POA: Diagnosis not present

## 2023-12-26 NOTE — Progress Notes (Deleted)
  Electrophysiology Office Follow up Visit Note:    Date:  12/26/2023   ID:  Brittney Fox, DOB [birth year], MRN [id number]  PCP:  Larnell Hamilton, MD  [facility] Cardiologist:  Ozell Fell, MD  [facility] Electrophysiologist:  OLE ONEIDA HOLTS, MD    Interval History:     Brittney Fox is a 83 y.o. female who presents for a follow up visit.   I last saw the patient February 27, 2023.  She had a prior catheter ablation for atrial fibrillation in January 2023.  She takes Eliquis  for stroke prophylaxis.  She saw Dr. Fell December 13, 2023.  At that appointment she reported some palpitations at night.  She was still taking Eliquis  and metoprolol .  A heart monitor was ordered by Dr. Fell at that appointment.      Past medical, surgical, social and family history were reviewed.  ROS:   Please see the history of present illness.    All other systems reviewed and are negative.  EKGs/Labs/Other Studies Reviewed:    The following studies were reviewed today:          Physical Exam:    VS:  LMP 04/02/1988     Wt Readings from Last 3 Encounters:  12/13/23 144 lb 12.8 oz (65.7 kg)  09/03/23 144 lb 12.8 oz (65.7 kg)  03/19/23 146 lb (66.2 kg)     GEN: no distress CARD: RRR, No MRG RESP: No IWOB. CTAB.      ASSESSMENT:    No diagnosis found. PLAN:    In order of problems listed above:  #Persistent atrial fibrillation #Atypical atrial flutter Prior catheter ablation in January 2023.  During the procedure she had the pulmonary veins isolated, posterior wall isolation, perimitral atrial flutter ablation, CSA E ablation anterior to the right superior pulmonary vein.  ***QTc today for Tikosyn?   Signed, OLE HOLTS, MD, [facility], [facility] 12/26/2023 2:31 PM    Electrophysiology [facility]

## 2023-12-27 ENCOUNTER — Ambulatory Visit: Admitting: Cardiology

## 2023-12-31 DIAGNOSIS — Z1331 Encounter for screening for depression: Secondary | ICD-10-CM | POA: Diagnosis not present

## 2023-12-31 DIAGNOSIS — I5030 Unspecified diastolic (congestive) heart failure: Secondary | ICD-10-CM | POA: Diagnosis not present

## 2023-12-31 DIAGNOSIS — E039 Hypothyroidism, unspecified: Secondary | ICD-10-CM | POA: Diagnosis not present

## 2023-12-31 DIAGNOSIS — R82998 Other abnormal findings in urine: Secondary | ICD-10-CM | POA: Diagnosis not present

## 2023-12-31 DIAGNOSIS — E559 Vitamin D deficiency, unspecified: Secondary | ICD-10-CM | POA: Diagnosis not present

## 2023-12-31 DIAGNOSIS — E1169 Type 2 diabetes mellitus with other specified complication: Secondary | ICD-10-CM | POA: Diagnosis not present

## 2023-12-31 DIAGNOSIS — I4891 Unspecified atrial fibrillation: Secondary | ICD-10-CM | POA: Diagnosis not present

## 2023-12-31 DIAGNOSIS — I110 Hypertensive heart disease with heart failure: Secondary | ICD-10-CM | POA: Diagnosis not present

## 2023-12-31 DIAGNOSIS — D6869 Other thrombophilia: Secondary | ICD-10-CM | POA: Diagnosis not present

## 2023-12-31 DIAGNOSIS — Z23 Encounter for immunization: Secondary | ICD-10-CM | POA: Diagnosis not present

## 2023-12-31 DIAGNOSIS — Z0000 Encounter for general adult medical examination without abnormal findings: Secondary | ICD-10-CM | POA: Diagnosis not present

## 2023-12-31 DIAGNOSIS — Z1339 Encounter for screening examination for other mental health and behavioral disorders: Secondary | ICD-10-CM | POA: Diagnosis not present

## 2023-12-31 DIAGNOSIS — E538 Deficiency of other specified B group vitamins: Secondary | ICD-10-CM | POA: Diagnosis not present

## 2024-01-02 DIAGNOSIS — R002 Palpitations: Secondary | ICD-10-CM | POA: Diagnosis not present

## 2024-01-06 ENCOUNTER — Ambulatory Visit: Payer: Self-pay | Admitting: Cardiovascular Disease

## 2024-01-06 DIAGNOSIS — R002 Palpitations: Secondary | ICD-10-CM

## 2024-01-06 DIAGNOSIS — I10 Essential (primary) hypertension: Secondary | ICD-10-CM

## 2024-01-13 NOTE — Progress Notes (Unsigned)
  Electrophysiology Office Follow up Visit Note:    Date:  01/14/2024   ID:  Brittney Fox, DOB 1940-10-17, MRN [id number]  PCP:  Larnell Hamilton, MD  [facility] Cardiologist:  Ozell Fell, MD  [facility] Electrophysiologist:  OLE ONEIDA HOLTS, MD    Interval History:     Brittney Fox is a 83 y.o. female who presents for a follow up visit.   I last saw the patient February 27, 2023.  She had a prior catheter ablation for atrial fibrillation in January 2023.  She takes Eliquis  for stroke prophylaxis.  She saw Dr. Fell December 13, 2023.  At that appointment she reported some palpitations at night.  She was still taking Eliquis  and metoprolol .  A heart monitor was ordered by Dr. Fell at that appointment.  She is doing okay today.  No prolonged episodes of arrhythmia.  She is happy with her current level of control.  She takes Eliquis  without bleeding issues.  She is still very active.  She is closing on a home sale today.      Past medical, surgical, social and family history were reviewed.  ROS:   Please see the history of present illness.    All other systems reviewed and are negative.  EKGs/Labs/Other Studies Reviewed:    The following studies were reviewed today:  January 06, 2024 ZIO monitor personally reviewed No atrial fibrillation Occasional supraventricular and ventricular ectopy 1 episode of SVT lasted 3 minutes and 27 seconds with an average heart rate of 147 bpm   EKG Interpretation Date/Time:  Tuesday January 14 2024 08:48:17 EDT Ventricular Rate:  65 PR Interval:  136 QRS Duration:  108 QT Interval:  440 QTC Calculation: 457 R Axis:   -34  Text Interpretation: Normal sinus rhythm Incomplete right bundle branch block Confirmed by HOLTS OLE [phone]) on 01/14/2024 8:49:41 AM    Physical Exam:    VS:  BP (!) 116/58 (BP Location: Left Arm, Patient Position: Sitting, Cuff Size: Normal)   Pulse 65   Ht 5' 5 (1.651 m)    Wt 147 lb (66.7 kg)   LMP 04/02/1988   SpO2 98%   BMI 24.46 kg/m     Wt Readings from Last 3 Encounters:  01/14/24 147 lb (66.7 kg)  12/13/23 144 lb 12.8 oz (65.7 kg)  09/03/23 144 lb 12.8 oz (65.7 kg)     GEN: no distress CARD: RRR, No MRG RESP: No IWOB. CTAB.      ASSESSMENT:    1. Persistent atrial fibrillation (HCC)   2. Atypical atrial flutter (HCC)    PLAN:    In order of problems listed above:  #Persistent atrial fibrillation #Atypical atrial flutter Prior catheter ablation in January 2023.  During the procedure she had the pulmonary veins isolated, posterior wall isolation, perimitral atrial flutter ablation, CFAE ablation anterior to the right superior pulmonary vein.  Recent heart monitor showed no prolonged episodes of atrial arrhythmias.  I discussed treatment options today including continuing with conservative therapy versus pursuing antiarrhythmic drug loading with Tikosyn.  She would still like to pursue conservative therapy for now.  I discussed my departure from [facility] during today's appointment.  She will transition her care to one of my EP partners, Dr Almetta.  Follow-up 12 months with EP MD.  Signed, OLE HOLTS, MD, [facility], [facility] 01/14/2024 8:49 AM    Electrophysiology [facility]

## 2024-01-14 ENCOUNTER — Encounter: Payer: Self-pay | Admitting: Cardiology

## 2024-01-14 ENCOUNTER — Ambulatory Visit: Attending: Cardiology | Admitting: Cardiology

## 2024-01-14 VITALS — BP 116/58 | HR 65 | Ht 65.0 in | Wt 147.0 lb

## 2024-01-14 DIAGNOSIS — I4819 Other persistent atrial fibrillation: Secondary | ICD-10-CM | POA: Insufficient documentation

## 2024-01-14 DIAGNOSIS — I484 Atypical atrial flutter: Secondary | ICD-10-CM | POA: Insufficient documentation

## 2024-01-14 NOTE — Patient Instructions (Signed)
 Medication Instructions:  Your physician recommends that you continue on your current medications as directed. Please refer to the Current Medication list given to you today.  *If you need a refill on your cardiac medications before your next appointment, please call your pharmacy*  Follow-Up: At [facility], you and your health needs are our priority.  As part of our continuing mission to provide you with exceptional heart care, our providers are all part of one team.  This team includes your primary Cardiologist (physician) and Advanced Practice Providers or APPs (Physician Assistants and Nurse Practitioners) who all work together to provide you with the care you need, when you need it.  Your next appointment:   1 year  Provider:   Donnice Primus, MD

## 2024-01-15 DIAGNOSIS — M549 Dorsalgia, unspecified: Secondary | ICD-10-CM | POA: Diagnosis not present

## 2024-01-22 ENCOUNTER — Ambulatory Visit (HOSPITAL_COMMUNITY)
Admission: RE | Admit: 2024-01-22 | Discharge: 2024-01-22 | Disposition: A | Discharge status: Home / Self Care | Source: Ambulatory Visit | Attending: Cardiology | Admitting: Cardiology

## 2024-01-22 DIAGNOSIS — I5032 Chronic diastolic (congestive) heart failure: Secondary | ICD-10-CM | POA: Insufficient documentation

## 2024-01-22 DIAGNOSIS — I10 Essential (primary) hypertension: Secondary | ICD-10-CM | POA: Insufficient documentation

## 2024-01-22 LAB — ECHOCARDIOGRAM COMPLETE
Area-P 1/2: 3.38 cm2
S' Lateral: 3 cm

## 2024-01-30 ENCOUNTER — Other Ambulatory Visit: Payer: Self-pay

## 2024-01-30 MED ORDER — FUROSEMIDE 40 MG PO TABS: 40.0000 mg | ORAL_TABLET | Freq: Every day | ORAL | 3 refills | Status: DC

## 2024-01-30 MED ORDER — SPIRONOLACTONE 25 MG PO TABS: 25.0000 mg | ORAL_TABLET | Freq: Every day | ORAL | 3 refills | Status: DC

## 2024-01-31 MED ORDER — METOPROLOL SUCCINATE ER 25 MG PO TB24: 25.0000 mg | ORAL_TABLET | Freq: Every day | ORAL | 3 refills | Status: DC

## 2024-02-03 DIAGNOSIS — I10 Essential (primary) hypertension: Secondary | ICD-10-CM | POA: Diagnosis not present

## 2024-02-04 LAB — BASIC METABOLIC PANEL WITH GFR
BUN/Creatinine Ratio: 18 (ref 12–28)
BUN: 13 mg/dL (ref 8–27)
CO2: 25 mmol/L (ref 20–29)
Calcium: 9.7 mg/dL (ref 8.7–10.3)
Chloride: 89 mmol/L — ABNORMAL LOW (ref 96–106)
Creatinine, Ser: 0.74 mg/dL (ref 0.57–1.00)
Glucose: 142 mg/dL — ABNORMAL HIGH (ref 70–99)
Potassium: 4.1 mmol/L (ref 3.5–5.2)
Sodium: 135 mmol/L (ref 134–144)
eGFR: 81 mL/min/1.73 (ref >59)

## 2024-02-10 ENCOUNTER — Ambulatory Visit: Attending: Cardiovascular Disease | Admitting: Cardiovascular Disease

## 2024-02-10 ENCOUNTER — Encounter: Payer: Self-pay | Admitting: Cardiovascular Disease

## 2024-02-10 VITALS — BP 121/80 | HR 72 | Ht 65.0 in | Wt 142.2 lb

## 2024-02-10 DIAGNOSIS — I5032 Chronic diastolic (congestive) heart failure: Secondary | ICD-10-CM | POA: Insufficient documentation

## 2024-02-10 DIAGNOSIS — I480 Paroxysmal atrial fibrillation: Secondary | ICD-10-CM | POA: Diagnosis not present

## 2024-02-10 MED ORDER — FUROSEMIDE 40 MG PO TABS: 40.0000 mg | ORAL_TABLET | ORAL | 3 refills | Status: AC

## 2024-02-10 MED ORDER — APIXABAN 5 MG PO TABS: 5.0000 mg | ORAL_TABLET | Freq: Two times a day (BID) | ORAL | 3 refills | Status: AC

## 2024-02-10 MED ORDER — METOPROLOL SUCCINATE ER 25 MG PO TB24: 25.0000 mg | ORAL_TABLET | Freq: Every day | ORAL | 3 refills | Status: DC

## 2024-02-10 MED ORDER — SPIRONOLACTONE 25 MG PO TABS: 25.0000 mg | ORAL_TABLET | Freq: Every day | ORAL | 3 refills | Status: AC

## 2024-02-10 MED ORDER — POTASSIUM CHLORIDE ER 10 MEQ PO TBCR: 10.0000 meq | EXTENDED_RELEASE_TABLET | Freq: Every day | ORAL | 3 refills | Status: AC

## 2024-02-10 NOTE — Progress Notes (Signed)
 Cardiology Office Note:    Date:  02/10/2024   ID:  Brittney Fox, DOB May 22, 1940, MRN [id number]  PCP:  Brittney Hamilton, MD   [facility] Providers Cardiologist:  Brittney Fell, MD Electrophysiologist:  Brittney DELENA Primus, MD     Referring MD: Brittney Hamilton, MD   Chief Complaint  Patient presents with   Atrial Fibrillation    History of Present Illness:    Brittney Fox is a 83 y.o. female with a hx of atrial fibrillation, presenting for follow-up evaluation. She A-fib ablation in 2023. Comorbid problems include HFpEF, hyperlipidemia, type 2 diabetes, and pulmonary hypertension. Patient is anticoagulated with apixaban .  The patient had a recent echocardiogram demonstrating LVEF 50 to 55%, grade 2 diastolic dysfunction, mild RV dysfunction with moderately enlarged RV size and estimated RVSP of 58 mmHg.  There is trivial mitral and mild to moderate tricuspid regurgitation.  The patient was started on furosemide.  She states that the swelling in her legs is better, but she really does not appreciate any other significant change.  Her breathing has been stable and she is not particularly limited by dyspnea.  No chest pain or pressure.  No orthopnea or PND.  She still has some feelings of heart palpitations especially at nighttime.  She has recently met with Dr. Cindie and she is scheduled for EP follow-up with Dr. Primus moving forward.   Current Medications: Current Meds  Medication Sig   acetaminophen  (TYLENOL ) 500 MG tablet Take 500 mg by mouth every 6 (six) hours as needed for moderate pain.   Cholecalciferol (VITAMIN D ) 50 MCG (2000 UT) tablet Take 2,000 Units by mouth daily.   dicyclomine  (BENTYL ) 20 MG tablet TAKE ONE TABLET EVERY 6 HOURS AS NEEDED (Patient taking differently: Take 20 mg by mouth as needed.)   diphenoxylate -atropine  (LOMOTIL ) 2.5-0.025 MG tablet TAKE ONE TABLET TWICE DAILY AS NEEDED FOR DIARRHEA OR LOOSE STOOLS   estradiol  (ESTRACE )  0.1 MG/GM vaginal cream as needed.   fluconazole  (DIFLUCAN ) 150 MG tablet Take 1 tablet (150 mg total) by mouth daily. Repeat in 72 hours if needed   fluocinonide cream (LIDEX) 0.05 % Apply 1 application. topically as needed.   glucose blood (ONETOUCH VERIO) test strip    hyoscyamine  (LEVBID ) 0.375 MG 12 hr tablet Take 1 tablet (0.375 mg total) by mouth every morning.   influenza vaccine adjuvanted (FLUAD) 0.5 ML injection Inject into the muscle.   JARDIANCE 10 MG TABS tablet Take 10 mg by mouth daily.   loperamide (IMODIUM A-D) 2 MG tablet Take 2-4 mg by mouth 4 (four) times daily as needed for diarrhea or loose stools.   meclizine (ANTIVERT) 25 MG tablet Take 25 mg by mouth as needed.   naproxen sodium (ALEVE) 220 MG tablet Take 220-440 mg by mouth daily as needed (pain).   nitrofurantoin  (MACRODANTIN ) 100 MG capsule Take 100 mg by mouth 2 (two) times daily.   pantoprazole  (PROTONIX ) 40 MG tablet Take 1 tablet (40 mg total) by mouth daily.   Polyvinyl Alcohol -Povidone (REFRESH OP) Place 1 drop into both eyes daily as needed (dry eyes).   Probiotic Product (ALIGN PO) Take 1 capsule by mouth daily.   temazepam (RESTORIL) 15 MG capsule Take 15 mg by mouth as needed.   vitamin B-12 (CYANOCOBALAMIN ) 1000 MCG tablet Take 1,000 mcg by mouth daily.   [DISCONTINUED] ELIQUIS  5 MG TABS tablet TAKE ONE TABLET TWICE DAILY   [DISCONTINUED] furosemide (LASIX) 40 MG tablet Take 1 tablet (40 mg total)  by mouth daily.   [DISCONTINUED] metoprolol  succinate (TOPROL -XL) 25 MG 24 hr tablet Take 1 tablet (25 mg total) by mouth daily.   [DISCONTINUED] potassium chloride  (KLOR-CON ) 10 MEQ tablet TAKE TWO TABLETS TWICE DAILY   [DISCONTINUED] spironolactone (ALDACTONE) 25 MG tablet Take 1 tablet (25 mg total) by mouth daily.     Allergies:   Morphine sulfate, Codeine phosphate, Metformin hcl, and Sitagliptin   ROS:   Please see the history of present illness.    All other systems reviewed and are  negative.  EKGs/Labs/Other Studies Reviewed:    The following studies were reviewed today: Cardiac Studies & Procedures   ______________________________________________________________________________________________   STRESS TESTS  MYOCARDIAL PERFUSION IMAGING 12/07/2020  Interpretation Summary   The study is normal. The study is low risk.   LV perfusion is normal. There is no evidence of ischemia. There is no evidence of infarction.   Left ventricular function is normal. Nuclear stress EF: 55 %. The left ventricular ejection fraction is normal (55-65%). End diastolic cavity size is normal.   Prior study available for comparison from 02/19/2005. No changes compared to prior study. No change from previous study 2006  Normal resting and stress perfusion. No ischemia or infarction EF 55% no change from study done 2006   ECHOCARDIOGRAM  ECHOCARDIOGRAM COMPLETE 01/22/2024  Narrative ECHOCARDIOGRAM REPORT    Patient Name:   Brittney Fox [facility] Date of Exam: 01/22/2024 Medical Rec #:  [id number]            Height:       65.0 in Accession #:    [id number]           Weight:       147.0 lb Date of Birth:  10/31/1940            BSA:          1.736 m Patient Age:    83 years             BP:           130/82 mmHg Patient Gender: F                    HR:           51 bpm. Exam Location:  [address]  Procedure: 2D Echo, Cardiac Doppler, Color Doppler and 3D Echo (Both Spectral and Color Flow Doppler were utilized during procedure).  Indications:    Chronic Diastolic Heart Failure I50.32  History:        Patient has prior history of Echocardiogram examinations, most recent 02/20/2021. PFO, Atrial Fibrillation Ablation, Arrythmias:Atrial Fibrillation; Risk Factors:Dyslipidemia and Diabetes.  Sonographer:    Brittney Fox RDCS Referring Phys: [phone] Brittney Fox  IMPRESSIONS   1. Left ventricular ejection fraction, by estimation, is 50 to 55%. Left ventricular ejection  fraction by 3D volume is 56 %. The left ventricle has low normal function. The left ventricle has no regional wall motion abnormalities. Left ventricular diastolic parameters are consistent with Grade II diastolic dysfunction (pseudonormalization). Elevated left atrial pressure. 2. Right ventricular systolic function is mildly reduced. The right ventricular size is moderately enlarged. There is moderately elevated pulmonary artery systolic pressure. The estimated right ventricular systolic pressure is 57.5 mmHg. 3. Left atrial size was mildly dilated. 4. Right atrial size was moderately dilated. 5. The mitral valve is normal in structure. Trivial mitral valve regurgitation. No evidence of mitral stenosis. 6. Tricuspid valve regurgitation is mild to moderate. 7. The aortic valve is  tricuspid. There is mild thickening of the aortic valve. Aortic valve regurgitation is not visualized. Aortic valve sclerosis is present, with no evidence of aortic valve stenosis. 8. There is borderline dilatation of the ascending aorta, measuring 38 mm. 9. The inferior vena cava is dilated in size with <50% respiratory variability, suggesting right atrial pressure of 15 mmHg. 10. Evidence of atrial level shunting detected by color flow Doppler. There is a small patent foramen ovale with predominantly left to right shunting across the atrial septum.  Comparison(s): Prior images reviewed side by side. The left ventricular systolic function is slightly worse. The left ventricular diastolic function is significantly worse. The right ventricular systolic function is worse. The right ventricle is now dilated. There is evidence of increased left and right atrial pressure.  FINDINGS Left Ventricle: Left ventricular ejection fraction, by estimation, is 50 to 55%. Left ventricular ejection fraction by 3D volume is 56 %. The left ventricle has low normal function. The left ventricle has no regional wall motion abnormalities. The  left ventricular internal cavity size was normal in size. There is no left ventricular hypertrophy. Left ventricular diastolic parameters are consistent with Grade II diastolic dysfunction (pseudonormalization). Elevated left atrial pressure.  Right Ventricle: The right ventricular size is moderately enlarged. No increase in right ventricular wall thickness. Right ventricular systolic function is mildly reduced. There is moderately elevated pulmonary artery systolic pressure. The tricuspid regurgitant velocity is 3.26 m/s, and with an assumed right atrial pressure of 15 mmHg, the estimated right ventricular systolic pressure is 57.5 mmHg.  Left Atrium: Left atrial size was mildly dilated.  Right Atrium: Right atrial size was moderately dilated.  Pericardium: There is no evidence of pericardial effusion.  Mitral Valve: The mitral valve is normal in structure. Trivial mitral valve regurgitation. No evidence of mitral valve stenosis.  Tricuspid Valve: The tricuspid valve is normal in structure. Tricuspid valve regurgitation is mild to moderate.  Aortic Valve: The aortic valve is tricuspid. There is mild thickening of the aortic valve. Aortic valve regurgitation is not visualized. Aortic valve sclerosis is present, with no evidence of aortic valve stenosis.  Pulmonic Valve: The pulmonic valve was grossly normal. Pulmonic valve regurgitation is trivial. No evidence of pulmonic stenosis.  Aorta: The aortic root is normal in size and structure. There is borderline dilatation of the ascending aorta, measuring 38 mm.  Venous: The inferior vena cava is dilated in size with less than 50% respiratory variability, suggesting right atrial pressure of 15 mmHg.  IAS/Shunts: Evidence of atrial level shunting detected by color flow Doppler. A small patent foramen ovale is detected with predominantly left to right shunting across the atrial septum.  Additional Comments: 3D was performed not requiring image  post processing on an independent workstation and was normal.   LEFT VENTRICLE PLAX 2D LVIDd:         4.55 cm         Diastology LVIDs:         3.00 cm         LV e' medial:    4.96 cm/s LV PW:         0.90 cm         LV E/e' medial:  15.7 LV IVS:        1.00 cm         LV e' lateral:   7.74 cm/s LVOT diam:     2.10 cm         LV E/e' lateral: 10.1 LV  SV:         55 LV SV Index:   32 LVOT Area:     3.46 cm        3D Volume EF LV 3D EF:    Left ventricul ar ejection fraction by 3D volume is 56 %.  3D Volume EF: 3D EF:        56 % LV EDV:       128 ml LV ESV:       56 ml LV SV:        72 ml  RIGHT VENTRICLE            IVC RV Basal diam:  4.70 cm    IVC diam: 2.00 cm RV Mid diam:    3.70 cm RV S prime:     9.57 cm/s TAPSE (M-mode): 1.8 cm  LEFT ATRIUM             Index        RIGHT ATRIUM           Index LA diam:        4.00 cm 2.30 cm/m   RA Area:     22.50 cm LA Vol (A2C):   53.8 ml 31.00 ml/m  RA Volume:   67.10 ml  38.66 ml/m LA Vol (A4C):   51.8 ml 29.85 ml/m LA Biplane Vol: 53.4 ml 30.77 ml/m AORTIC VALVE LVOT Vmax:   61.95 cm/s LVOT Vmean:  43.500 cm/s LVOT VTI:    0.158 m  AORTA Ao Root diam: 3.30 cm Ao Asc diam:  3.80 cm  MITRAL VALVE               TRICUSPID VALVE MV Area (PHT): 3.38 cm    TR Peak grad:   42.5 mmHg MV Decel Time: 225 msec    TR Vmax:        326.00 cm/s MV E velocity: 77.80 cm/s MV A velocity: 27.90 cm/s  SHUNTS MV E/A ratio:  2.79        Systemic VTI:  0.16 m Systemic Diam: 2.10 cm  Mihai Croitoru MD Electronically signed by Jerel Balding MD Signature Date/Time: 01/22/2024/4:37:59 PM    Final    MONITORS  LONG TERM MONITOR (3-14 DAYS) 01/03/2024  Narrative Patch Wear Time:  2 days and 23 hours (2025-09-16T17:18:51-0400 to 2025-09-19T17:11:33-0400)  Patient had a min HR of 46 bpm, max HR of 162 bpm, and avg HR of 69 bpm. Predominant underlying rhythm was Sinus Rhythm. 1540 Supraventricular Tachycardia runs occurred,  the run with the fastest interval lasting 12 beats with a max rate of 162 bpm, the longest lasting 3 mins 27 secs with an avg rate of 147 bpm. Supraventricular Tachycardia was detected within +/- 45 seconds of symptomatic patient event(s). Isolated SVEs were occasional (4.6%, 13002), SVE Couplets were occasional (3.0%, 4196), and SVE Triplets were occasional (1.7%, 1620). Isolated VEs were occasional (1.6%, 4478), VE Couplets were rare (<1.0%, 31), and no VE Triplets were present. Ventricular Bigeminy and Trigeminy were present.  Summary: The basic rhythm is normal sinus with an average heart rate of 69 bpm.  There is no high-grade AV block or significant bradycardic episodes.  Occasional PVCs at a burden of 1.6% with no ventricular runs.  Frequent supraventricular runs, the longest lasting 3 minutes and 27 seconds at a heart rate of approximately 147 bpm, consider atrial tachycardia versus atypical atrial flutter.  No atrial fibrillation detected.       ______________________________________________________________________________________________  EKG:        Recent Labs: 09/03/2023: Hemoglobin 16.4; Platelets 244 02/03/2024: BUN 13; Creatinine, Ser 0.74; Potassium 4.1; Sodium 135  Recent Lipid Panel    Component Value Date/Time   CHOL 144 09/13/2015 0734   TRIG 87 09/13/2015 0734   TRIG 88 02/06/2006 1028   HDL 59 09/13/2015 0734   CHOLHDL 2.4 09/13/2015 0734   VLDL 17 09/13/2015 0734   LDLCALC 68 09/13/2015 0734   LDLDIRECT 150.2 11/28/2010 1131     Risk Assessment/Calculations:    CHA2DS2-VASc Score = 7   This indicates a 11.2% annual risk of stroke. The patient's score is based upon: CHF History: 1 HTN History: 1 Diabetes History: 1 Stroke History: 0 Vascular Disease History: 1 Age Score: 2 Gender Score: 1          STOP-Bang Score:  1       Physical Exam:    VS:  BP 121/80   Pulse 72   Ht 5' 5 (1.651 m)   Wt 142 lb 3.2 oz (64.5 kg)   LMP 04/02/1988    SpO2 98%   BMI 23.66 kg/m     Wt Readings from Last 3 Encounters:  02/10/24 142 lb 3.2 oz (64.5 kg)  01/14/24 147 lb (66.7 kg)  12/13/23 144 lb 12.8 oz (65.7 kg)     GEN:  Well nourished, well developed in no acute distress HEENT: Normal NECK: No JVD; No carotid bruits LYMPHATICS: No lymphadenopathy CARDIAC: RRR, no murmurs, rubs, gallops RESPIRATORY:  Clear to auscultation without rales, wheezing or rhonchi  ABDOMEN: Soft, non-tender, non-distended MUSCULOSKELETAL:  No edema; No deformity  SKIN: Warm and dry NEUROLOGIC:  Alert and oriented x 3 PSYCHIATRIC:  Normal affect   Assessment & Plan Paroxysmal atrial fibrillation (HCC) Continue apixaban  for oral anticoagulation and metoprolol  succinate.  Follow-up with EP as planned. Chronic heart failure with preserved ejection fraction (HFpEF) (HCC) Recent changes noted in her echocardiogram.  She is treated with Jardiance, metoprolol  succinate, spironolactone, and furosemide.  Advised that she could reduce her furosemide dose to 40 mg 3 days weekly.  She has not appreciated any change in her functional capacity with the addition of furosemide, but has noted some improvement in her lower extremity swelling.  Repeat echocardiogram 1 year.  Follow-up with me in 6 months.  Patient remains remarkably active and still works as a customer service manager.            Medication Adjustments/Labs and Tests Ordered: Current medicines are reviewed at length with the patient today.  Concerns regarding medicines are outlined above.  No orders of the defined types were placed in this encounter.  Meds ordered this encounter  Medications   furosemide (LASIX) 40 MG tablet    Sig: Take 1 tablet (40 mg total) by mouth every Monday, Wednesday, and Friday.    Dispense:  90 tablet    Refill:  3   apixaban  (ELIQUIS ) 5 MG TABS tablet    Sig: Take 1 tablet (5 mg total) by mouth 2 (two) times daily.    Dispense:  180 tablet    Refill:  3   metoprolol   succinate (TOPROL -XL) 25 MG 24 hr tablet    Sig: Take 1 tablet (25 mg total) by mouth daily.    Dispense:  90 tablet    Refill:  3   potassium chloride  (KLOR-CON ) 10 MEQ tablet    Sig: Take 1 tablet (10 mEq total) by mouth daily.    Dispense:  90 tablet    Refill:  3   spironolactone (ALDACTONE) 25 MG tablet    Sig: Take 1 tablet (25 mg total) by mouth daily.    Dispense:  90 tablet    Refill:  3    Starting spiro by itself, stopping spiro-hydrochlorothiazide combo    Patient Instructions  Medication Instructions:  CHANGE Lasix to Monday, Wednesday, Friday  *If you need a refill on your cardiac medications before your next appointment, please call your pharmacy*  Lab Work: None ordered today. If you have labs (blood work) drawn today and your tests are completely normal, you will receive your results only by: MyChart Message (if you have MyChart) OR A paper copy in the mail If you have any lab test that is abnormal or we need to change your treatment, we will call you to review the results.  Testing/Procedures: None ordered today.  Follow-Up: At [facility], you and your health needs are our priority.  As part of our continuing mission to provide you with exceptional heart care, our providers are all part of one team.  This team includes your primary Cardiologist (physician) and Advanced Practice Providers or APPs (Physician Assistants and Nurse Practitioners) who all work together to provide you with the care you need, when you need it.  Your next appointment:   6 month(s)  Provider:   Ozell Fell, MD      Signed, Brittney Fell, MD  02/10/2024 1:18 PM     HeartCare

## 2024-02-10 NOTE — Patient Instructions (Signed)
 Medication Instructions:  CHANGE Lasix to Monday, Wednesday, Friday  *If you need a refill on your cardiac medications before your next appointment, please call your pharmacy*  Lab Work: None ordered today. If you have labs (blood work) drawn today and your tests are completely normal, you will receive your results only by: MyChart Message (if you have MyChart) OR A paper copy in the mail If you have any lab test that is abnormal or we need to change your treatment, we will call you to review the results.  Testing/Procedures: None ordered today.  Follow-Up: At [facility], you and your health needs are our priority.  As part of our continuing mission to provide you with exceptional heart care, our providers are all part of one team.  This team includes your primary Cardiologist (physician) and Advanced Practice Providers or APPs (Physician Assistants and Nurse Practitioners) who all work together to provide you with the care you need, when you need it.  Your next appointment:   6 month(s)  Provider:   Ozell Fell, MD

## 2024-02-10 NOTE — Assessment & Plan Note (Signed)
 Continue apixaban  for oral anticoagulation and metoprolol  succinate.  Follow-up with EP as planned.

## 2024-02-26 ENCOUNTER — Other Ambulatory Visit: Payer: Self-pay | Admitting: Internal Medicine

## 2024-03-16 ENCOUNTER — Other Ambulatory Visit: Payer: Self-pay

## 2024-03-16 DIAGNOSIS — I480 Paroxysmal atrial fibrillation: Secondary | ICD-10-CM

## 2024-03-17 DIAGNOSIS — N390 Urinary tract infection, site not specified: Secondary | ICD-10-CM | POA: Diagnosis not present

## 2024-03-23 ENCOUNTER — Ambulatory Visit (HOSPITAL_BASED_OUTPATIENT_CLINIC_OR_DEPARTMENT_OTHER): Payer: Medicare Other | Admitting: Obstetrics & Gynecology

## 2024-03-27 ENCOUNTER — Other Ambulatory Visit: Payer: Self-pay | Admitting: Internal Medicine

## 2024-03-31 ENCOUNTER — Encounter (HOSPITAL_COMMUNITY): Payer: Self-pay | Admitting: Internal Medicine

## 2024-03-31 ENCOUNTER — Ambulatory Visit (HOSPITAL_COMMUNITY)
Admission: RE | Admit: 2024-03-31 | Discharge: 2024-03-31 | Disposition: A | Discharge status: Home / Self Care | Source: Ambulatory Visit | Attending: Internal Medicine | Admitting: Internal Medicine

## 2024-03-31 VITALS — BP 118/80 | HR 60 | Ht 65.0 in | Wt 150.1 lb

## 2024-03-31 DIAGNOSIS — I480 Paroxysmal atrial fibrillation: Secondary | ICD-10-CM | POA: Diagnosis not present

## 2024-03-31 DIAGNOSIS — I5032 Chronic diastolic (congestive) heart failure: Secondary | ICD-10-CM | POA: Insufficient documentation

## 2024-03-31 DIAGNOSIS — D6859 Other primary thrombophilia: Secondary | ICD-10-CM | POA: Diagnosis not present

## 2024-03-31 DIAGNOSIS — I10 Essential (primary) hypertension: Secondary | ICD-10-CM | POA: Diagnosis not present

## 2024-03-31 MED ORDER — METOPROLOL SUCCINATE ER 25 MG PO TB24: 25.0000 mg | ORAL_TABLET | Freq: Every day | ORAL | 0 refills | Status: AC

## 2024-03-31 NOTE — Progress Notes (Signed)
 "  Primary Care Physician: Larnell Hamilton, MD Primary Cardiologist: Ozell Fell, MD Electrophysiologist: Donnice DELENA Primus, MD  Referring Physician: Ozell Fell, MD   Brittney Fox is a 83 y.o. female with a history of persistent AF (on Eliquis ) s/p AF ablation 04/2021, HFpEF, HLD, DM type II, pulmonary HTN, Raynaud's syndrome, GERD ,who presents for follow up in the [facility].  The patient was initially diagnosed with atrial fibrillation by Dr. Verlin in 2022 and was started on Eliquis  and wore a 3-day event monitor with referral to Dr. Cindie.  She was evaluated by Dr. Cindie on 11/29/2020 and was started on flecainide  due to intolerance to amiodarone . She underwent AF ablation by Dr. Cindie on 04/2021 and had significant amount of scarring in her atrium with recommendation of Tikosyn in the future for rhythm control.  She was seen by Dr. Fell on 12/13/2023 and wore a 3-day event monitor that showed no prolonged episodes of arrhythmia.  She was last seen by Dr. Cindie on 01/14/2024 and at that time was doing well with no prolonged episodes of arrhythmia.  Tikosyn was discussed at that visit and patient elected at that time to pursue conservative therapy.  She will be followed by Dr. Primus in the future moving forward.  She was seen by Dr. Fell on 02/10/2024 and underwent repeat 2D echo that showed EF of 50-55% with grade 2 DD and elevated left atrial pressure with moderately elevated PASP and mildly elevated LA and moderately dilated RA.  During her visit she was still having feelings of palpitations especially at nighttime.   Ms. Jere presents today for follow-up in the atrial fibrillation clinic.  She has been compliant with her Eliquis  and reports no missed doses.  She reports episodes of palpitations that occur while lying on her stomach mainly at night.  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  She walks total of  7000-10,000 steps daily without any palpitations.  During today's visit we discussed rhythm control with Tikosyn however due to her long QT interval she is not a candidate to initiate medication.  We also briefly discussed possible repeat ablation and referral to EP to determine candidacy.  She is not a candidate for amiodarone  due to previous elevated TSH and increased tremors and is not a candidate for Multaq due to CHF.  She had all questions answered otherwise to her satisfaction today.  Atrial Fibrillation Management history: Previous antiarrhythmic drugs: Flecainide  intolerant to amiodarone  Previous cardioversions: None Previous ablations: 2023 Anticoagulation history: Eliquis   ROS- All systems are reviewed and negative except as per the HPI above.  Past Medical History:  Diagnosis Date   Anal fissure    as a child   Blood transfusion without reported diagnosis    Diabetes mellitus    managed by diet and exercise   Diverticulosis of colon (without mention of hemorrhage)    Fatty liver    GERD (gastroesophageal reflux disease)    Heart murmur    Hyperlipidemia    Irritable bowel syndrome    Lymphocytic colitis    chronic   Neuromuscular disorder (HCC)    neuropathy   Osteoarthritis    Osteopenia 09/2007   Other mucopurulent conjunctivitis    Premature ventricular contractions    Raynaud's syndrome    Rosacea    opitcal   Spinal stenosis     Current Outpatient Medications  Medication Sig Dispense Refill   acetaminophen  (TYLENOL ) 500 MG tablet Take 500  mg by mouth every 6 (six) hours as needed for moderate pain.     apixaban  (ELIQUIS ) 5 MG TABS tablet Take 1 tablet (5 mg total) by mouth 2 (two) times daily. 180 tablet 3   Cholecalciferol (VITAMIN D ) 50 MCG (2000 UT) tablet Take 2,000 Units by mouth daily.     dicyclomine  (BENTYL ) 20 MG tablet TAKE ONE TABLET EVERY 6 HOURS AS NEEDED (Patient taking differently: Take 20 mg by mouth as needed.) 90 tablet 0    diphenoxylate -atropine  (LOMOTIL ) 2.5-0.025 MG tablet TAKE ONE TABLET TWICE DAILY AS NEEDED FOR DIARRHEA OR LOOSE STOOLS 180 tablet 3   estradiol  (ESTRACE ) 0.1 MG/GM vaginal cream as needed.     fluconazole  (DIFLUCAN ) 150 MG tablet Take 1 tablet (150 mg total) by mouth daily. Repeat in 72 hours if needed 2 tablet 0   fluocinonide cream (LIDEX) 0.05 % Apply 1 application. topically as needed.     furosemide  (LASIX ) 40 MG tablet Take 1 tablet (40 mg total) by mouth every Monday, Wednesday, and Friday. 90 tablet 3   glucose blood (ONETOUCH VERIO) test strip      hyoscyamine  (LEVBID ) 0.375 MG 12 hr tablet TAKE ONE TABLET EVERY MORNING 90 tablet 1   influenza vaccine adjuvanted (FLUAD) 0.5 ML injection Inject into the muscle. 0.5 mL 0   JARDIANCE 10 MG TABS tablet Take 10 mg by mouth daily.     loperamide (IMODIUM A-D) 2 MG tablet Take 2-4 mg by mouth 4 (four) times daily as needed for diarrhea or loose stools.     meclizine (ANTIVERT) 25 MG tablet Take 25 mg by mouth as needed.     naproxen sodium (ALEVE) 220 MG tablet Take 220-440 mg by mouth daily as needed (pain).     nitrofurantoin  (MACRODANTIN ) 100 MG capsule Take 100 mg by mouth 2 (two) times daily.     pantoprazole  (PROTONIX ) 40 MG tablet Take 1 tablet (40 mg total) by mouth daily. 30 tablet 11   Polyvinyl Alcohol -Povidone (REFRESH OP) Place 1 drop into both eyes daily as needed (dry eyes).     potassium chloride  (KLOR-CON ) 10 MEQ tablet Take 1 tablet (10 mEq total) by mouth daily. 90 tablet 3   Probiotic Product (ALIGN PO) Take 1 capsule by mouth daily.     spironolactone  (ALDACTONE ) 25 MG tablet Take 1 tablet (25 mg total) by mouth daily. 90 tablet 3   temazepam (RESTORIL) 15 MG capsule Take 15 mg by mouth as needed.     vitamin B-12 (CYANOCOBALAMIN ) 1000 MCG tablet Take 1,000 mcg by mouth daily.     metoprolol  succinate (TOPROL -XL) 25 MG 24 hr tablet Take 1 tablet (25 mg total) by mouth daily. May take extra 1/2 tab 12.5 mg for palpitations  for a total of 37.5 mg 120 tablet 0   No current facility-administered medications for this encounter.    Physical Exam: BP 118/80   Pulse 60   Ht 5' 5 (1.651 m)   Wt 68.1 kg   LMP 04/02/1988   BMI 24.98 kg/m   GEN: Well nourished, well developed in no acute distress NECK: No JVD; No carotid bruits CARDIAC: Regular rate and rhythm, no murmurs, rubs, gallops RESPIRATORY:  Clear to auscultation without rales, wheezing or rhonchi  ABDOMEN: Soft, non-tender, non-distended EXTREMITIES:  No edema; No deformity   Wt Readings from Last 3 Encounters:  03/31/24 68.1 kg  02/10/24 64.5 kg  01/14/24 66.7 kg     EKG today demonstrates:   EKG Interpretation Date/Time:  Tuesday March 31 2024 09:55:52 EST Ventricular Rate:  60 PR Interval:  128 QRS Duration:  102 QT Interval:  470 QTC Calculation: 470 R Axis:   -20  Text Interpretation: Normal sinus rhythm with sinus arrhythmia Low voltage QRS Incomplete right bundle branch block Confirmed by Wyn Manus [phone]) on 03/31/2024 10:00:11 AM        Echo Completed 01/22/2024 1. Left ventricular ejection fraction, by estimation, is 50 to 55%. Left  ventricular ejection fraction by 3D volume is 56 %. The left ventricle has  low normal function. The left ventricle has no regional wall motion  abnormalities. Left ventricular  diastolic parameters are consistent with Grade II diastolic dysfunction  (pseudonormalization). Elevated left atrial pressure.   2. Right ventricular systolic function is mildly reduced. The right  ventricular size is moderately enlarged. There is moderately elevated  pulmonary artery systolic pressure. The estimated right ventricular  systolic pressure is 57.5 mmHg.   3. Left atrial size was mildly dilated.   4. Right atrial size was moderately dilated.   5. The mitral valve is normal in structure. Trivial mitral valve  regurgitation. No evidence of mitral stenosis.   6. Tricuspid valve regurgitation is  mild to moderate.   7. The aortic valve is tricuspid. There is mild thickening of the aortic  valve. Aortic valve regurgitation is not visualized. Aortic valve  sclerosis is present, with no evidence of aortic valve stenosis.   8. There is borderline dilatation of the ascending aorta, measuring 38  mm.   9. The inferior vena cava is dilated in size with <50% respiratory  variability, suggesting right atrial pressure of 15 mmHg.  10. Evidence of atrial level shunting detected by color flow Doppler.  There is a small patent foramen ovale with predominantly left to right  shunting across the atrial septum.   CHA2DS2-VASc Score = 7  The patient's score is based upon: CHF History: 1 HTN History: 1 Diabetes History: 1 Stroke History: 0 Vascular Disease History: 1 Age Score: 2 Gender Score: 1      ASSESSMENT AND PLAN: Paroxysmal Atrial Fibrillation (ICD10:  I48.0) The patient's CHA2DS2-VASc score is 7, indicating a 11.2% annual risk of stroke.   - Patient presented today to discuss candidacy for Tikosyn but currently not a candidate due to QT prolongation with baseline Qtc ~470 ms - She was previously intolerant to amiodarone  and is interested in discussing further rhythm control with EP. - Continue Eliquis  5 mg twice daily (not a candidate for reduction due to weight and renal function) - Continue Toprol -XL 25 mg daily with as needed 12.5 mg at night for increased palpitations -Will refer to EP to discuss if candidate for ablation.  Secondary Hypercoagulable State (ICD10:  D68.69) The patient is at significant risk for stroke/thromboembolism based upon her CHA2DS2-VASc Score of 7.  Continue Apixaban  (Eliquis ).   HFpEF: - Euvolemic on exam today and denies any shortness of breath - Continue Lasix  40 mg Monday, Wednesday, Friday  Primary HTN: BP well controlled. Continue current antihypertensive regimen.    Signed,  Wyn Raddle, Manus Shove, NP    03/31/2024 11:52 AM    Follow up  with the AF Clinic as needed  Manus Wyn, NP-C [facility] [address] [place], [place] [address] [phone] "

## 2024-03-31 NOTE — Patient Instructions (Addendum)
 May take extra metoprolol  1/2 tab (12.5 mg ) for palpitations for total of 37.5 mg

## 2024-04-14 NOTE — Progress Notes (Signed)
" ° °  Breast and Pelvic Exam Patient name: Brittney Fox MRN [id number]  Date of birth: 1941/02/11 Chief Complaint:   Gynecologic Exam  History of Present Illness:   Brittney Fox is a 84 y.o. [id number] Caucasian female being seen today for breast and pelvic exam.  Son, Sheryl, had a stroke.  He was 45 yo.  He is going to start outpatient PT.  He is still using a walker.    Current complaints: Patient is on Lasix  due to increased A Fib.   Denies vaginal bleeding.  About a month ago, had some rectal bleeding.  Has a known hemorrhoid.  Bleeding stopped and she hasn't had any issues since.    Patient's last menstrual period was 04/02/1988.  Last pap 2017. H/O abnormal pap: no Last mammogram: 10/29/2023.  Results were: normal. Family h/o breast cancer: yes sister. Last colonoscopy: 05/09/2019. Results were: normal. Family h/o colorectal cancer: no DEXA:  10/29/2023.  Osteopenia -1.8.     03/19/2023    1:13 PM 01/04/2022   12:27 PM 01/04/2022   12:26 PM 12/28/2020   10:22 AM  Depression screen PHQ 2/9  Decreased Interest 0 0 0 0  Down, Depressed, Hopeless 0 0 0 0  PHQ - 2 Score 0 0 0 0    Review of Systems:   Pertinent items are noted in HPI Denies any new urinary or bowel issues.  Denies pelvic pain.   Pertinent History Reviewed:  Reviewed past medical,surgical, social and family history.  Reviewed problem list, medications and allergies. Physical Assessment:   Vitals:   04/15/24 1346  BP: 124/74  Pulse: 65  SpO2: 99%  Weight: 145 lb 12.8 oz (66.1 kg)  Height: 5' 5 (1.651 m)  Body mass index is 24.26 kg/m.        Physical Examination:   General appearance - well appearing, and in no distress  Mental status - alert, oriented to person, place, and time  Psych:  She has a normal mood and affect  Skin - warm and dry, normal color, no suspicious lesions noted  Chest - effort normal, all lung fields clear to auscultation bilaterally  Heart - normal rate and regular  rhythm  Neck:  midline trachea, no thyromegaly or nodules  Breasts - breasts appear normal, no suspicious masses, no skin or nipple changes or  axillary nodes  Abdomen - soft, nontender, nondistended, no masses or organomegaly  Pelvic - VULVA: normal appearing vulva with no masses, tenderness or lesions   VAGINA: normal appearing vagina with normal color and discharge, no lesions   CERVIX: surgically absent  Thin prep pap is not indicated  UTERUS: surgically absent  ADNEXA: No adnexal masses or tenderness noted.  Rectal - normal rectal, good sphincter tone, no masses felt.   Extremities:  No swelling or varicosities noted  Chaperone present for exam  No results found for this or any previous visit (from the past 24 hours).  Assessment & Plan:  1. Encntr for gyn exam (general) (routine) w/o abn findings (Primary) - Pap smear not indicated - Mammogram 10/29/2023 - Colonoscopy 05/09/2019 - Bone mineral density 10/29/2023 with T score -1.8 - lab work done with PCP, Dr. Brigitte - vaccines reviewed/updated  2. H/O: hysterectomy  3. History of recurrent UTIs - followed by urology, doing well  4. Paroxysmal atrial fibrillation (HCC) - on eliquis    Ronal GORMAN Pinal, MD 04/18/2024 10:11 PM "

## 2024-04-15 ENCOUNTER — Ambulatory Visit (INDEPENDENT_AMBULATORY_CARE_PROVIDER_SITE_OTHER): Admitting: Obstetrics & Gynecology

## 2024-04-15 ENCOUNTER — Encounter (HOSPITAL_BASED_OUTPATIENT_CLINIC_OR_DEPARTMENT_OTHER): Payer: Self-pay | Admitting: Obstetrics & Gynecology

## 2024-04-15 VITALS — BP 124/74 | HR 65 | Ht 65.0 in | Wt 145.8 lb

## 2024-04-15 DIAGNOSIS — I480 Paroxysmal atrial fibrillation: Secondary | ICD-10-CM | POA: Diagnosis not present

## 2024-04-15 DIAGNOSIS — Z01419 Encounter for gynecological examination (general) (routine) without abnormal findings: Secondary | ICD-10-CM | POA: Diagnosis not present

## 2024-04-15 DIAGNOSIS — Z90710 Acquired absence of both cervix and uterus: Secondary | ICD-10-CM

## 2024-04-15 DIAGNOSIS — Z87440 Personal history of urinary (tract) infections: Secondary | ICD-10-CM

## 2024-04-15 DIAGNOSIS — Z7901 Long term (current) use of anticoagulants: Secondary | ICD-10-CM | POA: Diagnosis not present

## 2024-05-05 ENCOUNTER — Other Ambulatory Visit (HOSPITAL_BASED_OUTPATIENT_CLINIC_OR_DEPARTMENT_OTHER): Payer: Self-pay | Admitting: Obstetrics & Gynecology

## 2024-07-15 ENCOUNTER — Ambulatory Visit: Admitting: Student in an Organized Health Care Education/Training Program
